# Patient Record
Sex: Female | Born: 1945 | Race: White | Hispanic: No | Marital: Married | Smoking: Never smoker
Health system: Southern US, Community
[De-identification: ages and names within clinical notes are randomized; demographics above are authoritative.]

## PROBLEM LIST (undated history)

## (undated) DIAGNOSIS — N189 Chronic kidney disease, unspecified: Secondary | ICD-10-CM

## (undated) DIAGNOSIS — I671 Cerebral aneurysm, nonruptured: Secondary | ICD-10-CM

## (undated) DIAGNOSIS — F0150 Vascular dementia without behavioral disturbance: Secondary | ICD-10-CM

## (undated) DIAGNOSIS — I679 Cerebrovascular disease, unspecified: Secondary | ICD-10-CM

## (undated) DIAGNOSIS — I509 Heart failure, unspecified: Secondary | ICD-10-CM

## (undated) DIAGNOSIS — I10 Essential (primary) hypertension: Secondary | ICD-10-CM

## (undated) DIAGNOSIS — F329 Major depressive disorder, single episode, unspecified: Secondary | ICD-10-CM

## (undated) DIAGNOSIS — F32A Depression, unspecified: Secondary | ICD-10-CM

## (undated) DIAGNOSIS — I2510 Atherosclerotic heart disease of native coronary artery without angina pectoris: Secondary | ICD-10-CM

## (undated) HISTORY — PX: ABDOMINAL SURGERY: SHX537

## (undated) HISTORY — PX: CSF SHUNT: SHX92

## (undated) HISTORY — PX: ROTATOR CUFF REPAIR: SHX139

## (undated) HISTORY — PX: CEREBRAL ANEURYSM REPAIR: SHX164

---

## 2006-08-03 ENCOUNTER — Inpatient Hospital Stay (HOSPITAL_COMMUNITY): Admission: EM | Admit: 2006-08-03 | Discharge: 2006-08-05 | Payer: Self-pay | Admitting: Emergency Medicine

## 2006-08-10 ENCOUNTER — Ambulatory Visit: Payer: Self-pay | Admitting: Internal Medicine

## 2006-09-02 ENCOUNTER — Ambulatory Visit: Payer: Self-pay | Admitting: Internal Medicine

## 2006-09-02 LAB — CONVERTED CEMR LAB
Eosinophils Absolute: 0.1 10*3/uL (ref 0.0–0.6)
HCT: 32.3 % — ABNORMAL LOW (ref 36.0–46.0)
Monocytes Absolute: 0.3 10*3/uL (ref 0.2–0.7)
Monocytes Relative: 4.9 % (ref 3.0–11.0)
Neutro Abs: 5.3 10*3/uL (ref 1.4–7.7)
Platelets: 337 10*3/uL (ref 150–400)
RBC: 3.76 M/uL — ABNORMAL LOW (ref 3.87–5.11)
RDW: 17 % — ABNORMAL HIGH (ref 11.5–14.6)
WBC: 7.1 10*3/uL (ref 4.5–10.5)

## 2006-10-15 ENCOUNTER — Ambulatory Visit: Payer: Self-pay | Admitting: Internal Medicine

## 2006-10-15 ENCOUNTER — Encounter: Payer: Self-pay | Admitting: Internal Medicine

## 2010-10-31 NOTE — H&P (Signed)
NAME:  AERILYNN, GOIN NO.:  [id number]   MEDICAL RECORD NO.:  [id number]          PATIENT TYPE:  EMS   LOCATION:  ED                           FACILITY:  [facility]   PHYSICIAN:  Hind Bosie Helper, MD      DATE OF BIRTH:  [birth year]   DATE OF ADMISSION:  08/02/2006  DATE OF DISCHARGE:                              HISTORY & PHYSICAL   PRIMARY CARE PHYSICIAN:  Unassigned.   CHIEF COMPLAINT:  Generalized weakness with presyncope for 1 day.   HISTORY OF PRESENT ILLNESS:  This is a 65 year old female with history  of hypertension, history of peptic ulcer disease.  She was recently  admitted to [facility] with history of  weakness, found to have hemoglobin of 6.7 status post endoscopy which  showed pyloric channel ulcer and anemia status post blood transfusion.  The patient was discharged with Protonix and advised to follow with  gastroenterology in 6 weeks for further evaluation.  The patient  admitted since discharge she was feeling generally weak and dizzy with  palpitations and  shortness of breath with minimal exertion.  Today  while she was at home, she was starting to eat a few sips of food, but  then she felt shivering and weak and dizzy.  Her husband 911 for further  evaluation.  The patient denies any nausea or any vomiting.  She had  epigastric pain mainly at the epigastric area 8/10.  She admitted was  like heartburn, not radiating and is not associated with any change in  bowel habits.  The patient had bowel movement today which she said is  brown in color and also condition associated with headaches but denies  any fever, no loss of consciousness and no lower or upper extremity  weakness.   PAST MEDICAL HISTORY:  1. History of peptic ulcer disease status post endoscopy which showed      pyloric channel ulcer, cannot rule out Helicobacter.  2. Anemia.  3. Esophageal nodule of uncertain clinical significance, rule out  neoplasm.  4. Gastroesophageal reflux disease.  5. Hernia.  6. Diverticulosis.  7. History of brain aneurysm status post VP shunt.  8. History of back surgery.  9. History of hypertension.  10.History of depression.  11.History of hyperlipidemia.   MEDICATIONS:  1. Benicar 20/12.5, 1 tab p.o. daily.  2. Metoprolol 25 mg p.o. daily.  3. Amoxicillin 500 mg p.o. q.6h. p.r.n. for tooth pain which was      recently prescribed by the primary care physician.  4. Alprazolam 1 mg.  5. Cymbalta 60 mg 1 p.o. daily.   ALLERGIES:  TEGRETOL, PHENYTOIN.   SOCIAL HISTORY:  The patient has no history of smoking, history of  alcohol, occasional IV drug use.  She is on disability and she lives  with her daughter and her husband.   FAMILY HISTORY:  Positive for mother died with history of emphysema.  Brother died with cancer of the __________.   PHYSICAL EXAMINATION:  VITAL SIGNS:  The patient seen in the emergency  room with vital signs temperature 98.5, blood pressure 102/42,  pulse  rate 85, respiratory rate 16, saturating 100%.  GENERAL:  The patient looks pale, not jaundiced or cyanotic, complains  of headache.  HEENT:  Normocephalic, atraumatic.  Pupils equal, and reactive to light  and accommodation.  Mucosa moist.  NECK:  No JVD, no thyromegaly, and no lymph nodes.  LUNGS:  Normal vesicular breathing, equal air entry.  HEART:  S1, S2, no added sound.  ABDOMEN:  Mild epigastric tenderness.  Bowel sounds are sluggish, and no  organomegaly.  No rebound tenderness and no guarding.  EXTREMITIES:  No lower limb edema and peripheral pulses intact.  SKIN:  No rash.  NEUROLOGIC:  The patient alert and oriented x3, was nonfocal neurologic  findings.   LABORATORY DATA:  Sodium 130, potassium 4.5, chloride 96, BUN 20,  creatinine 1.08, calcium 8.7, white blood cells 55.2, hemoglobin 7.1,  platelets 774, MCV 85.9, MCH 33.3, INR 1, PTT 28.  Occult blood was  negative.  CT head:  Right parietal VP  shunt in place.  No  hydrocephalus.  No acute intracranial abnormality, mild chronic mitral  vascular disease.   ASSESSMENT/PLAN:  1. The patient mainly admitted for generalized body weakness.  She      also said feeling of presyncope and dizziness, possibility due to      anemia.  The patient was discharged recently from [facility] with according to records, hemoglobin was 10.5.  Today      hemoglobin is 7.1, so most probably the cause of dizziness and      generalized body weakness due to the gastrointestinal bleeding from      the peptic ulcer disease.  We will admit the patient to telemetry.      We will give the patient 2 units of packed RBCs.  We will check CBC      q.6h.  We will continue with Protonix IV.  We will consider GI      consult in the morning.  2. Thrombocytopenia with platelets 74.  The patient was discharged      yesterday on January 28, the patient's platelets were almost      normal.  Cause is unclear.  Possibility of lab error.  Would repeat      platelet count.  We will repeat CBC and further recommendations      after __________ if platelet count was low with low hemoglobin and      low white blood cells which is unlikely.  My suspicion is just lab      error.  3. Pyloric channel ulcer.  To rule out stenosis, we will do abdominal      x-ray, flat and upright, to rule out any evidence of complication      of peptic ulcer perforation or bleeding.  I would consider doing      gastric emptying study or __________ .  We will consider barium      swallow with follow-through to evaluate for any evidence of pyloric      stenosis.  We will continue with Protonix and Sucralfate, and      further recommendation according to the gastroenterology.  4. Deep venous thrombosis and gastrointestinal prophylaxis.     Hind Bosie Helper, MD  Electronically Signed    HIE/MEDQ  D:  08/03/2006  T:  08/03/2006  Job:  [id number]

## 2010-10-31 NOTE — Consult Note (Signed)
[name], Campbell NO.:  [id number]   MEDICAL RECORD NO.:  [id number]          PATIENT TYPE:  INP   LOCATION:  1439                         FACILITY:  [facility]   PHYSICIAN:  Iva Boop, MD,FACGDATE OF BIRTH:  1946/03/17   DATE OF CONSULTATION:  DATE OF DISCHARGE:                                 CONSULTATION   This patient was seen yesterday by the physician's assistant.  This is a  consultation summary.  I have also seen the patient.   ASSESSMENT:  1. Pyloric channel ulcer found by Dr. Vernell Barrier in [facility].  She left      the hospital without proton pump inhibitor therapy and presented      here with heme positive stool and anemia with hemoglobin of 6.7.      She has now improved after transfusion and is on PPI therapy.  2. Colonoscopy at same admission in [facility] in January, 2008, which      was unrevealing.  3. Helicobacter pylori testing was negative.  4. Chronic headaches.  5. Nausea and vomiting while he improves, somewhat related to the      headaches.  6. Hypertension.  7. History of ventriculoperitoneal shunt.  8. Heartburn.  9. Three back surgeries.  10.Two cervical spine surgeries.  11.Dyslipidemia.   ALLERGIES:  DILANTIN, TEGRETOL.   FAMILY HISTORY:  No colon cancer.   PLAN:  Patient is supported here.  She was transfused this  hospitalization and then improved.  She appears to be tolerating diet,  and plans are to be discharged tomorrow.  She is moving to [place] to  live with her daughter, and she will most likely follow up with me as an  outpatient.  No further endoscopic procedures were necessary.      Iva Boop, MD,FACG  Electronically Signed     CEG/MEDQ  D:  08/04/2006  T:  08/04/2006  Job:  [id number]

## 2010-10-31 NOTE — Assessment & Plan Note (Signed)
Kristen Campbell                         GASTROENTEROLOGY OFFICE NOTE   NAME:Campbell, Kristen                     MRN:          [id number]  DATE:09/02/2006                            DOB:          1945-06-23    CHIEF COMPLAINT:  Followup of gastric ulcer and anemia.   HISTORY:  Kristen Campbell is a lady that I saw in the hospital.  She had  pyloric channel ulcer diagnosed by Kristen Campbell in [facility] back in  January.  Colonoscopy at that time was unrevealing. She had presented  with heme positive stool and anemia and hemoglobin 6.7.  She moved up  here to live with her daughter and somehow she got out of the hospital  without a PPI.  She was readmitted in [place] with anemia.  I saw  her, we got her on a proton pump inhibitor, she was transfused and  started on iron and she is feeling much better.  She also had an  esophageal nodule in her esophagus that was of unclear etiology on the  endoscopy. I did get a chance to review that when she was in the  hospital, I do not have those records in my chart now.  Please see the  hospitalist history and physical and discharge summary which were  reviewed in her NE chart as well.   MEDICATIONS:  1. Benicar 20/12.5 mg daily.  2. Metoprolol 50 mg daily.  3. Lipitor 40 mg daily.  4. Cymbalta 60 mg daily.  5. Alprazolam 1 mg daily.  6. Protonix 40 mg twice daily.   ALLERGIES:  DILANTIN and  TEGRETOL.   PAST MEDICAL HISTORY:  She has depression, hypertension,  asthma/emphysema, dyslipidemia, osteoarthritis, gastric ulcer problems  15 years previously and in January 2008 anemia as above.   FAMILY HISTORY:  Noncontributory.   SOCIAL HISTORY:  She is living with her daughter now.  She is married.  She is disabled.  No alcohol, tobacco or drugs.   REVIEW OF SYSTEMS:  See medical record for full details.   PHYSICAL EXAMINATION:  Height 4 feet 11 inches, weight 125 pounds.  Blood pressure 130/78, pulse 64.   ASSESSMENT:  Recent problems with a pyloric channel ulcer and profound  anemia.  She is improving on appropriate therapy at this time.  She had  negative H. pylori testing.  These ulcers are probably benign but there  was really no history of NSAIDs and there was no H. pylori.   PLAN:  1. Recheck CBC to follow up anemia.  2. Schedule endoscopy in the next few weeks to evaluate for healing of      this ulcer and to confirm that.  3. She can reduce her Protonix to once a day.  4. Further plans pending above.     Kristen Boop, MD,FACG  Electronically Signed    CEG/MedQ  DD: 09/03/2006  DT: 09/03/2006  Job #: [id number]

## 2010-10-31 NOTE — Discharge Summary (Signed)
Kristen Campbell, Kristen Campbell NO.:  [id number]   MEDICAL RECORD NO.:  [id number]          PATIENT TYPE:  INP   LOCATION:  1439                         FACILITY:  [facility]   PHYSICIAN:  Hettie Holstein, D.O.    DATE OF BIRTH:  01-16-1946   DATE OF ADMISSION:  08/03/2006  DATE OF DISCHARGE:  08/05/2006                               DISCHARGE SUMMARY   PRIMARY CARE PHYSICIAN:  Unassigned.   PRIMARY GASTROENTEROLOGIST:  Dr. Stan Head.   FINAL DIAGNOSIS:  Pyloric ulcer.   SECONDARY DIAGNOSES:  1. Gastrointestinal bleed secondary to the above, anemia due to acute      blood loss anemia secondary to the above, resolved, low platelets      secondary to the above acute bleed.  2. Urinary tract infection.  3. History of ventriculoperitoneal shunt.  4. Hypertension.   MEDICATIONS ON DISCHARGE:  1. Keflex 500 mg twice daily.  2. Prilosec over-the-counter 20 mg twice daily.  3. In addition, she is to continue metoprolol 25 mg daily as before as      well as Amoxil 500 mg p.o. t.i.d.  4. Benicar HCT she is also to continue 20/12.5 daily.  5. Cymbalta 60 mg daily.  6. Lipitor 40 mg daily.   All of these have been prescribed previously by her primary care  Rilyn Upshaw.   DISPOSITION:  Kristen Campbell is felt to be stable for discharge and was  discharged home to followup with Dr. Leone Payor.   LABORATORY DATA:  Sodium is 138, potassium 4.1, BUN 10, creatinine 0.8,  hemoglobin 11 status post 2 units of packed RBCs.  Urinalysis revealed  E. coli in 60,000 colonies.   CONSULTANTS THIS ADMISSION:  Dr. Leone Payor as noted above of  Gastroenterology.   PROCEDURE PERFORMED:  She had previously undergone endoscopic evaluation  by Dr. Vernell Barrier in [facility] revealing a pyloric channel ulcer and  apparently had presented to the hospital, not taking a proton pump  inhibitor.  It was also noted that she underwent colonoscopic  examination at [facility] as well that was as well unrevealing.   HISTORY OF PRESENT ILLNESS:  Please refer to the H&P dictated by Dr.  Eda Paschal.  However, briefly, Kristen Campbell is a 65 year old female with a  history of hypertension and history of pyloric channel ulcer evaluated  at [facility] with  a history of weakness.  She was found to  have a hemoglobin at 6.7, status post endoscopy.  She was discharged  with Protonix and advised to followup with Gastroenterology in 6 weeks  for further evaluation.  Apparently, she was not taking a proton pump  inhibitor the day of admission.  She was at home and began to eat a few  sips of food and then she became weak and dizzy.  Her husband called 9-1-  1.  She denied any nausea or vomiting.  She has some epigastric pain  mainly in the epigastric region.  She reported brown stools.  In any  event, she denied any loss of consciousness.  She was admitted for  further evaluation of the hospital course.  In the Emergency Department,  she was noted to have hemoglobin of 7.1.  She underwent CT scan of her  head and revealed a VP shunt placed.  In any event, she was admitted,  and Gastroenterology was consulted.  Her platelets were low at 74,000,  also.  She was typed and crossed and began transfusion.  Her hemoglobin  stabilized, and she improved symptomatically with fluids and proton pump  inhibitor.  She was seen by Gastroenterology.  She was treated with  antiemetics, and she was advanced with reference to her diet which she  tolerated well.  Her urinalysis revealed E. coli.  She continued on  treatment for urinary infection.  She had been on amoxicillin which she  was to continue.  Her hemoglobin stabilized.  She was felt to be  medically stable for discharge and followup.      Hettie Holstein, D.O.  Electronically Signed     Hettie Holstein, D.O.  Electronically Signed    ESS/MEDQ  D:  09/23/2006  T:  09/23/2006  Job:  [id number]

## 2013-02-11 DIAGNOSIS — G9340 Encephalopathy, unspecified: Secondary | ICD-10-CM | POA: Insufficient documentation

## 2013-02-11 DIAGNOSIS — R079 Chest pain, unspecified: Secondary | ICD-10-CM | POA: Insufficient documentation

## 2014-09-08 ENCOUNTER — Emergency Department (HOSPITAL_COMMUNITY): Payer: Medicare Other

## 2014-09-08 ENCOUNTER — Emergency Department (HOSPITAL_COMMUNITY)
Admission: EM | Admit: 2014-09-08 | Discharge: 2014-09-09 | Disposition: A | Discharge status: Home / Self Care | Payer: Medicare Other | Attending: Emergency Medicine | Admitting: Emergency Medicine

## 2014-09-08 ENCOUNTER — Encounter (HOSPITAL_COMMUNITY): Payer: Self-pay | Admitting: *Deleted

## 2014-09-08 DIAGNOSIS — Z79899 Other long term (current) drug therapy: Secondary | ICD-10-CM | POA: Insufficient documentation

## 2014-09-08 DIAGNOSIS — R5383 Other fatigue: Secondary | ICD-10-CM | POA: Diagnosis not present

## 2014-09-08 DIAGNOSIS — F4321 Adjustment disorder with depressed mood: Secondary | ICD-10-CM | POA: Diagnosis not present

## 2014-09-08 DIAGNOSIS — W1839XA Other fall on same level, initial encounter: Secondary | ICD-10-CM | POA: Diagnosis not present

## 2014-09-08 DIAGNOSIS — Y998 Other external cause status: Secondary | ICD-10-CM | POA: Diagnosis not present

## 2014-09-08 DIAGNOSIS — I10 Essential (primary) hypertension: Secondary | ICD-10-CM | POA: Insufficient documentation

## 2014-09-08 DIAGNOSIS — Y9389 Activity, other specified: Secondary | ICD-10-CM | POA: Diagnosis not present

## 2014-09-08 DIAGNOSIS — W19XXXA Unspecified fall, initial encounter: Secondary | ICD-10-CM

## 2014-09-08 DIAGNOSIS — S0990XA Unspecified injury of head, initial encounter: Secondary | ICD-10-CM | POA: Diagnosis present

## 2014-09-08 DIAGNOSIS — G44319 Acute post-traumatic headache, not intractable: Secondary | ICD-10-CM | POA: Diagnosis not present

## 2014-09-08 DIAGNOSIS — Y9289 Other specified places as the place of occurrence of the external cause: Secondary | ICD-10-CM | POA: Diagnosis not present

## 2014-09-08 HISTORY — DX: Depression, unspecified: F32.A

## 2014-09-08 HISTORY — DX: Essential (primary) hypertension: I10

## 2014-09-08 HISTORY — DX: Cerebral aneurysm, nonruptured: I67.1

## 2014-09-08 HISTORY — DX: Major depressive disorder, single episode, unspecified: F32.9

## 2014-09-08 LAB — COMPREHENSIVE METABOLIC PANEL
ALBUMIN: 4.5 g/dL (ref 3.5–5.2)
ALK PHOS: 357 U/L — AB (ref 39–117)
ALT: 102 U/L — ABNORMAL HIGH (ref 0–35)
AST: 114 U/L — AB (ref 0–37)
Anion gap: 12 (ref 5–15)
BUN: 12 mg/dL (ref 6–23)
CALCIUM: 9.9 mg/dL (ref 8.4–10.5)
CO2: 27 mmol/L (ref 19–32)
Chloride: 103 mmol/L (ref 96–112)
Creatinine, Ser: 0.89 mg/dL (ref 0.50–1.10)
GFR calc Af Amer: 77 mL/min — ABNORMAL LOW (ref >90)
GFR calc non Af Amer: 66 mL/min — ABNORMAL LOW (ref >90)
Glucose, Bld: 103 mg/dL — ABNORMAL HIGH (ref 70–99)
Potassium: 3.8 mmol/L (ref 3.5–5.1)
SODIUM: 142 mmol/L (ref 135–145)
Total Bilirubin: 0.4 mg/dL (ref 0.3–1.2)
Total Protein: 8.7 g/dL — ABNORMAL HIGH (ref 6.0–8.3)

## 2014-09-08 LAB — CBC
HCT: 41.9 % (ref 36.0–46.0)
Hemoglobin: 13.5 g/dL (ref 12.0–15.0)
MCH: 29.5 pg (ref 26.0–34.0)
MCHC: 32.2 g/dL (ref 30.0–36.0)
MCV: 91.7 fL (ref 78.0–100.0)
PLATELETS: 325 10*3/uL (ref 150–400)
RBC: 4.57 MIL/uL (ref 3.87–5.11)
RDW: 13.9 % (ref 11.5–15.5)
WBC: 7.8 10*3/uL (ref 4.0–10.5)

## 2014-09-08 LAB — URINALYSIS, ROUTINE W REFLEX MICROSCOPIC
Bilirubin Urine: NEGATIVE
Glucose, UA: NEGATIVE mg/dL
Ketones, ur: NEGATIVE mg/dL
NITRITE: NEGATIVE
Protein, ur: NEGATIVE mg/dL
Specific Gravity, Urine: 1.011 (ref 1.005–1.030)
UROBILINOGEN UA: 1 mg/dL (ref 0.0–1.0)
pH: 6 (ref 5.0–8.0)

## 2014-09-08 LAB — URINE MICROSCOPIC-ADD ON

## 2014-09-08 LAB — ETHANOL

## 2014-09-08 LAB — ACETAMINOPHEN LEVEL

## 2014-09-08 LAB — SALICYLATE LEVEL

## 2014-09-08 MED ORDER — NICOTINE 21 MG/24HR TD PT24: 21.0000 mg | MEDICATED_PATCH | Freq: Every day | TRANSDERMAL | Status: DC

## 2014-09-08 MED ORDER — LORAZEPAM 1 MG PO TABS
1.0000 mg | ORAL_TABLET | Freq: Three times a day (TID) | ORAL | Status: DC | PRN
  Administered 2014-09-08 – 2014-09-09 (×2): 1 mg via ORAL
  Filled 2014-09-08 (×2): qty 1

## 2014-09-08 MED ORDER — ALUM & MAG HYDROXIDE-SIMETH 200-200-20 MG/5ML PO SUSP: 30.0000 mL | ORAL | Status: DC | PRN

## 2014-09-08 MED ORDER — NEBIVOLOL HCL 10 MG PO TABS
10.0000 mg | ORAL_TABLET | Freq: Every day | ORAL | Status: DC
  Administered 2014-09-08 – 2014-09-09 (×2): 10 mg via ORAL
  Filled 2014-09-08 (×2): qty 1

## 2014-09-08 MED ORDER — ATORVASTATIN CALCIUM 10 MG PO TABS
10.0000 mg | ORAL_TABLET | Freq: Every day | ORAL | Status: DC
  Administered 2014-09-08 – 2014-09-09 (×2): 10 mg via ORAL
  Filled 2014-09-08 (×2): qty 1

## 2014-09-08 MED ORDER — ONDANSETRON HCL 4 MG PO TABS
4.0000 mg | ORAL_TABLET | Freq: Three times a day (TID) | ORAL | Status: DC | PRN
Start: 2014-09-08 — End: 2014-09-09

## 2014-09-08 MED ORDER — IBUPROFEN 200 MG PO TABS: 600.0000 mg | ORAL_TABLET | Freq: Three times a day (TID) | ORAL | Status: DC | PRN

## 2014-09-08 MED ORDER — OXYCODONE-ACETAMINOPHEN 5-325 MG PO TABS
1.0000 | ORAL_TABLET | Freq: Three times a day (TID) | ORAL | Status: DC | PRN
  Administered 2014-09-08 – 2014-09-09 (×2): 1 via ORAL
  Filled 2014-09-08 (×2): qty 1

## 2014-09-08 MED ORDER — CYCLOBENZAPRINE HCL 10 MG PO TABS
10.0000 mg | ORAL_TABLET | Freq: Three times a day (TID) | ORAL | Status: DC | PRN
Start: 2014-09-08 — End: 2014-09-09

## 2014-09-08 MED ORDER — ACETAMINOPHEN 325 MG PO TABS: 650.0000 mg | ORAL_TABLET | ORAL | Status: DC | PRN

## 2014-09-08 MED ORDER — DULOXETINE HCL 60 MG PO CPEP
60.0000 mg | ORAL_CAPSULE | Freq: Every day | ORAL | Status: DC
  Administered 2014-09-08 – 2014-09-09 (×2): 60 mg via ORAL
  Filled 2014-09-08 (×2): qty 1

## 2014-09-08 MED ORDER — ACETAMINOPHEN 500 MG PO TABS
1000.0000 mg | ORAL_TABLET | Freq: Once | ORAL | Status: AC
  Administered 2014-09-08: 1000 mg via ORAL
  Filled 2014-09-08: qty 2

## 2014-09-08 MED ORDER — ZOLPIDEM TARTRATE 5 MG PO TABS: 5.0000 mg | ORAL_TABLET | Freq: Every evening | ORAL | Status: DC | PRN

## 2014-09-08 NOTE — ED Notes (Signed)
Pt ambulated to restroom. 

## 2014-09-08 NOTE — ED Notes (Signed)
MD Walden at bedside 

## 2014-09-08 NOTE — ED Notes (Signed)
TTS at bedside. 

## 2014-09-08 NOTE — ED Notes (Signed)
ED PA at bedside

## 2014-09-08 NOTE — ED Notes (Signed)
Pt granddaughter reports that pt is suicidal and reports that she wants to die. Also adds that pt is unable to take care of herself. Pt verbalizes that she doesn't want to be here

## 2014-09-08 NOTE — ED Notes (Signed)
Pt acuity high d/t fall risk and SI reported by family. Pt calm and cooperative at this time.

## 2014-09-08 NOTE — BH Assessment (Signed)
Assessment Note  Kristen Campbell is an 69 y.o. female.  Patient was brought into the ED by family because of increased depression and alleged suicidal ideations.  Patient currently denies SI/HI, hallucinations, and other self-injurious behaviors.  Patient endorse symptoms of depression but denies suicidal thoughts. Patient admits to saying to her son-n-law she rather die than to allow him to take her to jail.  Per the patient the family was demanding that she and her husband stay at their home or the son-n-law was arrest her.  Per patient the son-n-law knows how terrified she is of jail so he was attempting to intimidate her so she said, "I rather die than allow you to take me to jail".  Patient reports she rolled out of the bed yesterday as a result hit her head and now the family is unsure if she can care for herself or her husband.  Patient reports being married for 47 years and always lived independent with her husband so the family accusations of her not having the capability to live alone with her husband is offensive. Patient began to mention the death of her family when she was 69 years old then became tearful, expressing the anger she has towards the person that caused the death and the grief about the loss.    CSW spoke with the daughter(Kristen Campbell) and granddaughter(Kristen Campbell) to collect collateral.  Family reports the patient was diagnosed with depression many years ago and ever since been medicated.  Daughter reports she is now over 40 years and at the age 48 her mother overdosed on medications per story from other family members.  Family reports for the past 2 years the patient increased sleeping, misusing medications, decrease daily duties, and missing appointments.  Per family the patient is being picky about which medications she is taking and sleeping more than 8 hours a day.  Family reports the husband is diagnosed with Alzheimer and the patient has HTN, seizure, chronic pain and has not been compliant  with medication attempt stability.  Family reports that the patient made multiple states of risk like wanting to go to heaven with her father, not wanting to live anymore, and she want to be with her daddy.  CSW consulted with Catha Nottingham, NP recommended to re-evaluate patient in the morning.   Axis I: Mood Disorder NOS Axis II: Deferred Axis III:  Past Medical History  Diagnosis Date  . Brain aneurysm   . Hypertension   . Depression    Axis IV: economic problems, housing problems, other psychosocial or environmental problems, problems related to social environment, problems with access to health care services and problems with primary support group Axis V: 41-50 serious symptoms  Past Medical History:  Past Medical History  Diagnosis Date  . Brain aneurysm   . Hypertension   . Depression     Past Surgical History  Procedure Laterality Date  . Abdominal surgery      fluid tumor removal  . Abdominal surgery      bleeding ulcers  . Rotator cuff repair    . Cerebral aneurysm repair    . Csf shunt      Family History: No family history on file.  Social History:  reports that she has never smoked. She does not have any smokeless tobacco history on file. She reports that she does not drink alcohol or use illicit drugs.  Additional Social History:     CIWA: CIWA-Ar BP: 163/81 mmHg Pulse Rate: 80 COWS:    Allergies:  Allergies  Allergen Reactions  . Codeine Nausea And Vomiting  . Dilantin [Phenytoin] Other (See Comments)    MD said never to take it unknown     Home Medications:  (Not in a hospital admission)  OB/GYN Status:  No LMP recorded. Patient is postmenopausal.  General Assessment Data Location of Assessment: [facility] ED ACT Assessment: Yes Is this a Tele or Face-to-Face Assessment?: Face-to-Face Is this an Initial Assessment or a Re-assessment for this encounter?: Initial Assessment Living Arrangements: Spouse/significant other Can pt return to current living  arrangement?: Yes Admission Status: Voluntary Is patient capable of signing voluntary admission?: Yes Transfer from: Home Referral Source: Self/Family/Friend  Medical Screening Exam [facility] Walk-in ONLY) Medical Exam completed: Yes  [facility] Crisis Care Plan Living Arrangements: Spouse/significant other Name of Psychiatrist: none Name of Therapist: none  Education Status Is patient currently in school?: No  Risk to self with the past 6 months Suicidal Ideation: No-Not Currently/Within Last 6 Months (Family reports pt having SI) Suicidal Intent: No-Not Currently/Within Last 6 Months Is patient at risk for suicide?: No (Patient denies) Suicidal Plan?: No-Not Currently/Within Last 6 Months Access to Means: No What has been your use of drugs/alcohol within the last 12 months?: none Previous Attempts/Gestures: Yes How many times?: 1 (Per family Pt overdosed more than 20 years ago) Triggers for Past Attempts: Family contact, Anniversary (grief) Intentional Self Injurious Behavior: None Family Suicide History: No Recent stressful life event(s): Conflict (Comment), Loss (Comment), Other (Comment) Persecutory voices/beliefs?: No Depression: Yes Depression Symptoms: Tearfulness, Guilt, Loss of interest in usual pleasures, Fatigue Substance abuse history and/or treatment for substance abuse?: No  Risk to Others within the past 6 months Homicidal Ideation: No-Not Currently/Within Last 6 Months Thoughts of Harm to Others: No-Not Currently Present/Within Last 6 Months Current Homicidal Intent: No-Not Currently/Within Last 6 Months Current Homicidal Plan: No-Not Currently/Within Last 6 Months Access to Homicidal Means: No History of harm to others?: No Assessment of Violence: None Noted Does patient have access to weapons?: No Criminal Charges Pending?: No Does patient have a court date: No  Psychosis Hallucinations: None noted Delusions: None noted  Mental Status  Report Appearance/Hygiene: In hospital gown Eye Contact: Fair Motor Activity: Freedom of movement Speech: Logical/coherent Level of Consciousness: Alert Mood: Depressed Affect: Flat Anxiety Level: Minimal Thought Processes: Coherent Judgement: Unimpaired Orientation: Person, Place, Time, Situation Obsessive Compulsive Thoughts/Behaviors: None  Cognitive Functioning Concentration: Fair Memory: Recent Intact, Remote Intact IQ: Average Insight: Fair Impulse Control: Fair Appetite: Fair Sleep: Increased Vegetative Symptoms: None  ADLScreening [facility] Assessment Services) Patient's cognitive ability adequate to safely complete daily activities?: Yes Patient able to express need for assistance with ADLs?: Yes Independently performs ADLs?: Yes (appropriate for developmental age)  Prior Inpatient Therapy Prior Inpatient Therapy: No  Prior Outpatient Therapy Prior Outpatient Therapy: Yes Prior Therapy Dates: unknown Prior Therapy Facilty/Provider(s): unknown Reason for Treatment: depression  ADL Screening (condition at time of admission) Patient's cognitive ability adequate to safely complete daily activities?: Yes Patient able to express need for assistance with ADLs?: Yes Independently performs ADLs?: Yes (appropriate for developmental age)             Advance Directives (For Healthcare) Does patient have an advance directive?: Yes Type of Advance Directive: Healthcare Power of Attorney, Living will    Additional Information 1:1 In Past 12 Months?: No CIRT Risk: No Elopement Risk: No Does patient have medical clearance?: Yes     Disposition:  Disposition Initial Assessment Completed for this Encounter: Yes Disposition of  Patient: Other dispositions Other disposition(s): Other (Comment) (pending)  On Site Evaluation by:   Reviewed with Physician:    Maryelizabeth Rowanorbett, Tamyra Fojtik A 09/08/2014 5:29 PM

## 2014-09-08 NOTE — ED Notes (Signed)
Pt ambulated to restroom with steady gait.

## 2014-09-08 NOTE — ED Notes (Signed)
Pt reports she fell and hit the back of her head 2 days ago. Has Hx of HA, which worsened slightly after fall. Rates it "11/10." Pt denies blurred vision, dizziness, n/v. Hx brain aneursym approx 20 years ago. Also reports being depressed for "years and years and years." Acutely worse due to "personal issues", life stressors which she does not elaborate on. Sts she doesn't want to be here, that granddaughter brought her and apologizing for looking disheveled as she was not planning to come here. Pt tearful through parts of triage.

## 2014-09-08 NOTE — ED Notes (Signed)
Security called to wand patient.   [facility]

## 2014-09-08 NOTE — ED Notes (Signed)
Md Gwendolyn GrantWalden to bedside.

## 2014-09-08 NOTE — ED Notes (Signed)
Pt reports a headache "all over"  And states I have headaches. She also states " my grandaughters think I'm crazy"

## 2014-09-08 NOTE — ED Provider Notes (Signed)
CSN: [id number]     Arrival date & time 09/08/14  1333 History   First MD Initiated Contact with Patient 09/08/14 1339     Chief Complaint  Patient presents with  . Medical Clearance  . Headache  . Hypertension     (Consider location/radiation/quality/duration/timing/severity/associated sxs/prior Treatment) HPI Comments: 69 year old female here with headache and psych complaints. Headache: Larey Seat out of her bed 2 days ago. He did not lose consciousness. She does not have any blurry vision, nausea, vomiting. She has a history of VP shunt. She has chronic headaches, this is unlike prior headache.  Psych: History of depression stemming from the death of her father 50 years ago. She states she's been depressed for a long time. Daughter and granddaughter in the room state patient has been reporting that she doesn't care anymore and that she wants to die. When asked the patient about these comments, she states that she did say these comments but she wanted to make her family angry because she doesn't feel like anyone cares about her. She states she's not currently suicidal and does not have a plan to kill herself.  Patient is a 69 y.o. female presenting with headaches and hypertension. The history is provided by the patient.  Headache Pain location:  Occipital Quality:  Dull Radiates to:  Does not radiate Severity currently:  10/10 Onset quality:  Gradual Duration:  2 days Timing:  Constant Progression:  Unchanged Chronicity:  New Similar to prior headaches: no   Context comment:  Larey Seat out of bed Relieved by:  Nothing Worsened by:  Nothing Associated symptoms: no cough and no fever   Hypertension Associated symptoms include headaches. Pertinent negatives include no shortness of breath.    Past Medical History  Diagnosis Date  . Brain aneurysm   . Hypertension   . Depression    Past Surgical History  Procedure Laterality Date  . Abdominal surgery      fluid tumor removal  .  Abdominal surgery      bleeding ulcers  . Rotator cuff repair    . Cerebral aneurysm repair    . Csf shunt     No family history on file. History  Substance Use Topics  . Smoking status: Never Smoker   . Smokeless tobacco: Not on file  . Alcohol Use: No   OB History    No data available     Review of Systems  Constitutional: Negative for fever and chills.  Respiratory: Negative for cough and shortness of breath.   Neurological: Positive for headaches.  All other systems reviewed and are negative.     Allergies  Codeine and Dilantin  Home Medications   Prior to Admission medications   Medication Sig Start Date End Date Taking? Authorizing Provider  atorvastatin (LIPITOR) 10 MG tablet Take 10 mg by mouth daily. 09/06/14  Yes Historical Provider, MD  BYSTOLIC 10 MG tablet Take 10 mg by mouth daily. 08/15/14  Yes Historical Provider, MD  cyclobenzaprine (FLEXERIL) 5 MG tablet Take 10 mg by mouth 3 (three) times daily as needed. 08/15/14  Yes Historical Provider, MD  DULoxetine (CYMBALTA) 60 MG capsule Take 60 mg by mouth daily. 08/09/14  Yes Historical Provider, MD  oxyCODONE-acetaminophen (PERCOCET/ROXICET) 5-325 MG per tablet Take 1 tablet by mouth every 8 (eight) hours as needed. pain 08/21/14  Yes Historical Provider, MD  Vitamin D, Ergocalciferol, (DRISDOL) 50000 UNITS CAPS capsule Take 50,000 Units by mouth once a week. 09/03/14   Historical Provider, MD  BP 159/95 mmHg  Pulse 74  Temp(Src) 98.8 F (37.1 C) (Oral)  Resp 18  SpO2 95% Physical Exam  Constitutional: She is oriented to person, place, and time. She appears well-developed and well-nourished. No distress.  HENT:  Head: Normocephalic and atraumatic.  Mouth/Throat: Oropharynx is clear and moist.  Eyes: EOM are normal. Pupils are equal, round, and reactive to light.  Neck: Normal range of motion. Neck supple.  Cardiovascular: Normal rate and regular rhythm.  Exam reveals no friction rub.   No murmur  heard. Pulmonary/Chest: Effort normal and breath sounds normal. No respiratory distress. She has no wheezes. She has no rales.  Abdominal: Soft. She exhibits no distension. There is no tenderness. There is no rebound.  Musculoskeletal: Normal range of motion. She exhibits no edema.  Neurological: She is alert and oriented to person, place, and time. No cranial nerve deficit. She exhibits normal muscle tone. Coordination normal.  Skin: No rash noted. She is not diaphoretic.  Psychiatric: She exhibits a depressed mood (very tearful).  Nursing note and vitals reviewed.   ED Course  Procedures (including critical care time) Labs Review Labs Reviewed  CBC  COMPREHENSIVE METABOLIC PANEL  ETHANOL  ACETAMINOPHEN LEVEL  SALICYLATE LEVEL  URINALYSIS, ROUTINE W REFLEX MICROSCOPIC    Imaging Review Dg Skull 1-3 Views  09/08/2014   CLINICAL DATA:  Fall 2 days ago, headache  EXAM: SKULL - 1-3 VIEW  COMPARISON:  No recent similar comparison exam  FINDINGS: There is no evidence of skull fracture or other focal bone lesions. Cervical fusion hardware partly visualized. Ventriculoperitoneal shunt catheter projecting over the right neck and upper chest is contiguous in its visualized aspects.  IMPRESSION: Negative.   Electronically Signed   By: Christiana Pellant M.D.   On: 09/08/2014 15:58   Dg Chest 2 View  09/08/2014   CLINICAL DATA:  Patient fell 2 days prior with headache. Hypertension  EXAM: CHEST  2 VIEW  COMPARISON:  August 03, 2006  FINDINGS: There are 2 shunt catheters tracking along the right hemithorax. Lungs are clear. Heart size and pulmonary vascularity are normal. No adenopathy. There is degenerative change in the thoracic spine. There is postoperative change in the lower cervical spine.  IMPRESSION: No edema or consolidation. Shunt catheters tracking along the right hemithorax anteriorly.   Electronically Signed   By: Bretta Bang III M.D.   On: 09/08/2014 15:55   Dg Abd 1  View  09/08/2014   CLINICAL DATA:  Headache with shunt catheters.  Recent trauma  EXAM: ABDOMEN - 1 VIEW  COMPARISON:  None.  FINDINGS: Two shunt catheters are noted with the tips in the right mid abdomen laterally. There is moderate stool in the colon. There is no bowel dilatation or air-fluid level suggesting obstruction. No free air. There are surgical clips near the gastroesophageal junction.  IMPRESSION: Bowel gas pattern unremarkable. Shunt catheters terminate in the right mid abdomen laterally. One of the catheters shows considerable coiling distally.   Electronically Signed   By: Bretta Bang III M.D.   On: 09/08/2014 15:56   Ct Head Wo Contrast  09/08/2014   CLINICAL DATA:  69 year old female with altered mental status and suicidal ideation.  EXAM: CT HEAD WITHOUT CONTRAST  TECHNIQUE: Contiguous axial images were obtained from the base of the skull through the vertex without intravenous contrast.  COMPARISON:  Head CT 08/02/2006.  FINDINGS: Right parietal ventriculostomy shunt catheter with tip terminating in the anterior aspect of the right lateral ventricle. Patchy and  confluent areas of decreased attenuation are noted throughout the deep and periventricular white matter of the cerebral hemispheres bilaterally, increased compared to the prior examination. The temporal horns of the lateral ventricles appear slightly rounded, which is only slightly more pronounced than the prior study 08/02/2006. No evidence of acute intracranial hemorrhage, no definite findings of acute/subacute cerebral ischemia, no mass or mass effect. Visualized paranasal sinuses and mastoids are well pneumatized. No acute displaced skull fractures are identified. Two small connector tubes extending to the ventriculostomy shunt catheter in the right posterior cervical region (1 of which is new compared to the prior study).  IMPRESSION: 1. Right parietal ventriculostomy shunt catheter in position, similar to the prior  examination. Today's study demonstrates very slight increased prominence of the ventricles, and more extensive periventricular low attenuation in the cerebral white matter than seen on the prior study. Given the intervening 8 year time interval, these findings are favored to reflect progressive atrophy (with ex vacuo dilatation of the ventricular system) and chronic ischemic changes in the cerebral white matter (rather than mild hydrocephalus and transependymal flow of CSF). Clinical correlation is recommended.   Electronically Signed   By: Trudie Reedaniel  Entrikin M.D.   On: 09/08/2014 18:27     EKG Interpretation None      MDM   Final diagnoses:  Fall  Depression  Acute post-traumatic headache, not intractable    68 rolled female here with headache and suicidal ideation. She denies any current suicidality and states she only commented on being suicidal because she wanted to make her family angry. She is currently on Cymbalta but it is not helping. She feels like most of her family doesn't care about her and states having neck her. Family denies this and states are very supportive. Pain patient is severely depressed. She is very tearful on exam. Since she was making suicidal comments, we will speak with psychiatry. She did fall is having very bad headache. She is not on blood thinners, but she does have a VP shunt. Will image her head. Head CT with chronic changes. Traumatic cause of headache, not acting like hydrocephalus, normal mental status, normal neurologic exam.  I feel she is medically clear. Psych wants to re-eval her in the morning, she is voluntarily staying at this time.  Elwin MochaBlair Khaza Blansett, MD 09/08/14 2233

## 2014-09-08 NOTE — ED Notes (Signed)
Pt tearful and anxious on arrival due to another pt yelling on unit. Kristen Campbell did, become much calmer after speaking with staff. No s/s of distress noted at this time. Respirations regular and unlabored, skin warm and dry.

## 2014-09-08 NOTE — ED Notes (Signed)
Pt changing into scrubs 

## 2014-09-08 NOTE — ED Notes (Signed)
Pt with high acuity due to anxiety and family reports pt with SI

## 2014-09-09 DIAGNOSIS — R5383 Other fatigue: Secondary | ICD-10-CM | POA: Diagnosis present

## 2014-09-09 DIAGNOSIS — F4321 Adjustment disorder with depressed mood: Secondary | ICD-10-CM | POA: Diagnosis not present

## 2014-09-09 MED ORDER — HYDROXYZINE HCL 25 MG PO TABS: 25.0000 mg | ORAL_TABLET | Freq: Every day | ORAL | Status: DC

## 2014-09-09 MED ORDER — NAPROXEN 375 MG PO TABS
375.0000 mg | ORAL_TABLET | Freq: Two times a day (BID) | ORAL | Status: DC
Start: 2014-09-09 — End: 2019-03-11

## 2014-09-09 MED ORDER — PANTOPRAZOLE SODIUM 20 MG PO TBEC
20.0000 mg | DELAYED_RELEASE_TABLET | Freq: Two times a day (BID) | ORAL | Status: DC
  Filled 2014-09-09 (×2): qty 1

## 2014-09-09 MED ORDER — PANTOPRAZOLE SODIUM 20 MG PO TBEC
20.0000 mg | DELAYED_RELEASE_TABLET | Freq: Two times a day (BID) | ORAL | Status: DC
Start: 2014-09-09 — End: 2019-03-14

## 2014-09-09 MED ORDER — NAPROXEN 375 MG PO TABS
375.0000 mg | ORAL_TABLET | Freq: Two times a day (BID) | ORAL | Status: DC
  Filled 2014-09-09 (×2): qty 1

## 2014-09-09 NOTE — Discharge Instructions (Signed)
Depression Depression is feeling sad, low, down in the dumps, blue, gloomy, or empty. In general, there are two kinds of depression:  Normal sadness or grief. This can happen after something upsetting. It often goes away on its own within 2 weeks. After losing a loved one (bereavement), normal sadness and grief may last longer than two weeks. It usually gets better with time.  Clinical depression. This kind lasts longer than normal sadness or grief. It keeps you from doing the things you normally do in life. It is often hard to function at home, work, or at school. It may affect your relationships with others. Treatment is often needed. GET HELP RIGHT AWAY IF:  You have thoughts about hurting yourself or others.  You lose touch with reality (psychotic symptoms). You may:  See or hear things that are not real.  Have untrue beliefs about your life or people around you.  Your medicine is giving you problems. MAKE SURE YOU:  Understand these instructions.  Will watch your condition.  Will get help right away if you are not doing well or get worse. Document Released: 07/04/2010 Document Revised: 10/16/2013 Document Reviewed: 10/01/2011 [employer] Patient Information 2015 [employer]. This information is not intended to replace advice given to you by your health care provider. Make sure you discuss any questions you have with your health care provider.  

## 2014-09-09 NOTE — [facility] Suicide Risk Assessment (Signed)
Suicide Risk Assessment  Discharge Assessment   [facility]   Demographic Factors:  Age 69 or older and Caucasian  Total Time spent with patient: 45 minutes  Musculoskeletal: Strength & Muscle Tone: within normal limits Gait & Station: normal Patient leans: N/A  Psychiatric Specialty Exam:     Blood pressure 142/102, pulse 76, temperature 98.2 F (36.8 C), temperature source Oral, resp. rate 17, SpO2 99 %.There is no height or weight on file to calculate BMI.  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 (Cigarettes, Smokeless Tobacco, Cigars, and/or Pipes) Yes, A prescription for an FDA-approved tobacco cessation medication was offered at discharge and the patient refused  Mental Status Per Nursing Assessment::   On Admission:   Depression  Current Mental Status by Physician: NA  Loss Factors: NA  Historical Factors: NA  Risk Reduction Factors:   Sense of responsibility to family, Living with another person, especially a relative, Positive social support and Positive coping skills or problem solving skills  Continued Clinical Symptoms:  Fatigue, mild depression  Cognitive Features That Contribute To Risk:  None    Suicide  Risk:  Minimal: No identifiable suicidal ideation.  Patients presenting with no risk factors but with morbid ruminations; may be classified as minimal risk based on the severity of the depressive symptoms  Principal Problem: Adjustment disorder with depressed mood Discharge Diagnoses:  Patient Active Problem List   Diagnosis Date Noted  . Caregiver with fatigue [R53.83] 09/09/2014    Priority: High  . Adjustment disorder with depressed mood [F43.21] 09/09/2014    Priority: High      Plan Of Care/Follow-up recommendations:  Activity:  as tolerated Diet:  heart healthy diet  Is patient on multiple antipsychotic therapies at discharge:  No   Has Patient had three or more failed trials of antipsychotic monotherapy by history:  No  Recommended Plan for Multiple Antipsychotic Therapies: NA    LORD, JAMISON, PMH-NP 09/09/2014, 1:31 PM

## 2014-09-09 NOTE — Consult Note (Signed)
[facility] Consult   Reason for Consult:  Depression Referring Physician:  EDP Patient Identification: Timera Windt MRN:  [id number] Principal Diagnosis: Adjustment disorder with depressed mood Diagnosis:   Patient Active Problem List   Diagnosis Date Noted  . Caregiver with fatigue [R53.83] 09/09/2014    Priority: High  . Adjustment disorder with depressed mood [F43.21] 09/09/2014    Priority: High    Total Time spent with patient: 45 minutes  Subjective:   Lygia Tworek is a 69 y.o. female patient does not warrant admission.  HPI:  The patient was brought to the ED by family due to sleeping and missing MD appointments.  Evidently, the family has wanted Omah and her husband to go to an assisted living for a year but she has been resistive.  On assessment this am, she promptly sat up and was trying to fix her hair, concerned about how she looked.  She denies suicidal/homicidal ideations, hallucinations, and alcohol/drug issues.  Shavell does express depression, "been depressed lot of years", started at the age of 25 when her father was killed in an car accident.  She went to counseling after his death until she married her husband (22 years) and had children.  Remy "lives for her grandchildren".  She has six grandchildren and two great-grandchildren.  Her grand-daughter was visiting prior to the assessment, very attentive to her grandmother.  Kailoni reports being exhausted at times trying to care for her husband who has been in and out of the hospital.  She believes she needs to be there when he is there to take care of him and it has worn her out.  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  She also takes Tylenol PM for sleep.  Her liver enzymes were elevated and she was educated about the importance of avoiding acetaminophen products.  She is started with a new PCP at the beginning of  April, notes given to her to take to him to change her pain medications to avoid acetaminophen.  Vistaril Rx given for PRN sleep versus her Tylenol PM.  Zayli was coherent, answered questions appropriately, alert and oriented x 4, good historian.  No past psychiatric history other than counseling (Cymbalta for depression and pain issues by her PCP), no past suicide attempts or ideations.  Stable for discharge with resources for counseling to assist her with her stress. HPI Elements:   Location:  generalized. Quality:  acute. Severity:  mild. Timing:  intermittent. Duration:  few days. Context:  stressors.  Past Medical History:  Past Medical History  Diagnosis Date  . Brain aneurysm   . Hypertension   . Depression     Past Surgical History  Procedure Laterality Date  . Abdominal surgery      fluid tumor removal  . Abdominal surgery      bleeding ulcers  . Rotator cuff repair    . Cerebral aneurysm repair    . Csf shunt     Family History: No family history on file. Social History:  History  Alcohol Use No     History  Drug Use No    History   Social History  . Marital Status: Married    Spouse Name: N/A  . Number of Children: N/A  . Years of Education: N/A   Social History Main Topics  . Smoking status: Never Smoker   . Smokeless tobacco: Not on file  .  Alcohol Use: No  . Drug Use: No  . Sexual Activity: Not on file   Other Topics Concern  . None   Social History Narrative  . None   Additional Social History:                          Allergies:   Allergies  Allergen Reactions  . Codeine Nausea And Vomiting  . Dilantin [Phenytoin] Other (See Comments)    MD said never to take it unknown     Labs:  Results for orders placed or performed during the hospital encounter of 09/08/14 (from the past 48 hour(s))  Urinalysis, Routine w reflex microscopic     Status: Abnormal   Collection Time: 09/08/14  3:15 PM  Result Value Ref Range   Color,  Urine YELLOW YELLOW   APPearance CLOUDY (A) CLEAR   Specific Gravity, Urine 1.011 1.005 - 1.030   pH 6.0 5.0 - 8.0   Glucose, UA NEGATIVE NEGATIVE mg/dL   Hgb urine dipstick TRACE (A) NEGATIVE   Bilirubin Urine NEGATIVE NEGATIVE   Ketones, ur NEGATIVE NEGATIVE mg/dL   Protein, ur NEGATIVE NEGATIVE mg/dL   Urobilinogen, UA 1.0 0.0 - 1.0 mg/dL   Nitrite NEGATIVE NEGATIVE   Leukocytes, UA TRACE (A) NEGATIVE  Urine microscopic-add on     Status: None   Collection Time: 09/08/14  3:15 PM  Result Value Ref Range   Squamous Epithelial / LPF RARE RARE   WBC, UA 0-2 <3 WBC/hpf   RBC / HPF 0-2 <3 RBC/hpf  CBC     Status: None   Collection Time: 09/08/14  3:23 PM  Result Value Ref Range   WBC 7.8 4.0 - 10.5 K/uL   RBC 4.57 3.87 - 5.11 MIL/uL   Hemoglobin 13.5 12.0 - 15.0 g/dL   HCT 41.9 36.0 - 46.0 %   MCV 91.7 78.0 - 100.0 fL   MCH 29.5 26.0 - 34.0 pg   MCHC 32.2 30.0 - 36.0 g/dL   RDW 13.9 11.5 - 15.5 %   Platelets 325 150 - 400 K/uL  Comprehensive metabolic panel     Status: Abnormal   Collection Time: 09/08/14  3:23 PM  Result Value Ref Range   Sodium 142 135 - 145 mmol/L   Potassium 3.8 3.5 - 5.1 mmol/L   Chloride 103 96 - 112 mmol/L   CO2 27 19 - 32 mmol/L   Glucose, Bld 103 (H) 70 - 99 mg/dL   BUN 12 6 - 23 mg/dL   Creatinine, Ser 0.89 0.50 - 1.10 mg/dL   Calcium 9.9 8.4 - 10.5 mg/dL   Total Protein 8.7 (H) 6.0 - 8.3 g/dL   Albumin 4.5 3.5 - 5.2 g/dL   AST 114 (H) 0 - 37 U/L   ALT 102 (H) 0 - 35 U/L   Alkaline Phosphatase 357 (H) 39 - 117 U/L   Total Bilirubin 0.4 0.3 - 1.2 mg/dL   GFR calc non Af Amer 66 (L) >90 mL/min   GFR calc Af Amer 77 (L) >90 mL/min    Comment: (NOTE) The eGFR has been calculated using the CKD EPI equation. This calculation has not been validated in all clinical situations. eGFR's persistently <90 mL/min signify possible Chronic Kidney Disease.    Anion gap 12 5 - 15  Ethanol     Status: None   Collection Time: 09/08/14  3:23 PM  Result  Value Ref Range   Alcohol, Ethyl (B) <5  0 - 9 mg/dL    Comment:        LOWEST DETECTABLE LIMIT FOR SERUM ALCOHOL IS 11 mg/dL FOR MEDICAL PURPOSES ONLY   Acetaminophen level     Status: Abnormal   Collection Time: 09/08/14  3:23 PM  Result Value Ref Range   Acetaminophen (Tylenol), Serum <10.0 (L) 10 - 30 ug/mL    Comment:        THERAPEUTIC CONCENTRATIONS VARY SIGNIFICANTLY. A RANGE OF 10-30 ug/mL MAY BE AN EFFECTIVE CONCENTRATION FOR MANY PATIENTS. HOWEVER, SOME ARE BEST TREATED AT CONCENTRATIONS OUTSIDE THIS RANGE. ACETAMINOPHEN CONCENTRATIONS >150 ug/mL AT 4 HOURS AFTER INGESTION AND >50 ug/mL AT 12 HOURS AFTER INGESTION ARE OFTEN ASSOCIATED WITH TOXIC REACTIONS.   Salicylate level     Status: None   Collection Time: 09/08/14  3:23 PM  Result Value Ref Range   Salicylate Lvl <1.9 2.8 - 20.0 mg/dL    Vitals: Blood pressure 142/102, pulse 76, temperature 98.2 F (36.8 C), temperature source Oral, resp. rate 17, SpO2 99 %.  Risk to Self: Suicidal Ideation: No-Not Currently/Within Last 6 Months (Family reports pt having SI) Suicidal Intent: No-Not Currently/Within Last 6 Months Is patient at risk for suicide?: No (Patient denies) Suicidal Plan?: No-Not Currently/Within Last 6 Months Access to Means: No What has been your use of drugs/alcohol within the last 12 months?: none How many times?: 1 (Per family Pt overdosed more than 20 years ago) Triggers for Past Attempts: Family contact, Anniversary (grief) Intentional Self Injurious Behavior: None Risk to Others: Homicidal Ideation: No-Not Currently/Within Last 6 Months Thoughts of Harm to Others: No-Not Currently Present/Within Last 6 Months Current Homicidal Intent: No-Not Currently/Within Last 6 Months Current Homicidal Plan: No-Not Currently/Within Last 6 Months Access to Homicidal Means: No History of harm to others?: No Assessment of Violence: None Noted Does patient have access to weapons?: No Criminal Charges  Pending?: No Does patient have a court date: No Prior Inpatient Therapy: Prior Inpatient Therapy: No Prior Outpatient Therapy: Prior Outpatient Therapy: Yes Prior Therapy Dates: unknown Prior Therapy Facilty/Provider(s): unknown Reason for Treatment: depression  Current Facility-Administered Medications  Medication Dose Route Frequency Provider Last Rate Last Dose  . alum & mag hydroxide-simeth (MAALOX/MYLANTA) 200-200-20 MG/5ML suspension 30 mL  30 mL Oral PRN Evelina Bucy, MD      . atorvastatin (LIPITOR) tablet 10 mg  10 mg Oral Daily Evelina Bucy, MD   10 mg at 09/09/14 0902  . cyclobenzaprine (FLEXERIL) tablet 10 mg  10 mg Oral TID PRN Evelina Bucy, MD      . DULoxetine (CYMBALTA) DR capsule 60 mg  60 mg Oral Daily Evelina Bucy, MD   60 mg at 09/09/14 0904  . hydrOXYzine (ATARAX/VISTARIL) tablet 25 mg  25 mg Oral QHS Seraphim Affinito      . naproxen (NAPROSYN) tablet 375 mg  375 mg Oral BID WC Melaney Tellefsen      . nebivolol (BYSTOLIC) tablet 10 mg  10 mg Oral Daily Evelina Bucy, MD   10 mg at 09/09/14 0903  . nicotine (NICODERM CQ - dosed in mg/24 hours) patch 21 mg  21 mg Transdermal Daily Evelina Bucy, MD   21 mg at 09/08/14 2038  . ondansetron (ZOFRAN) tablet 4 mg  4 mg Oral Q8H PRN Evelina Bucy, MD       Current Outpatient Prescriptions  Medication Sig Dispense Refill  . atorvastatin (LIPITOR) 10 MG tablet Take 10 mg by mouth daily.  0  . BYSTOLIC 10 MG tablet Take  10 mg by mouth daily.  0  . cyclobenzaprine (FLEXERIL) 5 MG tablet Take 10 mg by mouth 3 (three) times daily as needed.  0  . DULoxetine (CYMBALTA) 60 MG capsule Take 60 mg by mouth daily.  0  . oxyCODONE-acetaminophen (PERCOCET/ROXICET) 5-325 MG per tablet Take 1 tablet by mouth every 8 (eight) hours as needed. pain  0  . Vitamin D, Ergocalciferol, (DRISDOL) 50000 UNITS CAPS capsule Take 50,000 Units by mouth once a week.  0    Musculoskeletal: Strength & Muscle Tone: within normal limits Gait & Station:  normal Patient leans: N/A  Psychiatric Specialty Exam:     Blood pressure 142/102, pulse 76, temperature 98.2 F (36.8 C), temperature source Oral, resp. rate 17, SpO2 99 %.There is no height or weight on file to calculate BMI.  General Appearance: Casual  Eye Contact::  Good  Speech:  Normal Rate  Volume:  Normal  Mood:  Depressed, mild  Affect:  Congruent  Thought Process:  Coherent  Orientation:  Full (Time, Place, and Person)  Thought Content:  WDL  Suicidal Thoughts:  No  Homicidal Thoughts:  No  Memory:  Immediate;   Good Recent;   Good Remote;   Good  Judgement:  Good  Insight:  Good  Psychomotor Activity:  Normal  Concentration:  Good  Recall:  Good  Fund of Knowledge:Good  Language: Good  Akathisia:  No  Handed:  Right  AIMS (if indicated):     Assets:  Catering manager Housing Leisure Time Physical Health Resilience Social Support  ADL's:  Intact  Cognition: WNL  Sleep:      Medical Decision Making: Review of Psycho-Social Stressors (1), Review or order clinical lab tests (1) and Review of Medication Regimen & Side Effects (2)  Treatment Plan Summary: Daily contact with patient to assess and evaluate symptoms and progress in treatment, Medication management and Plan Discharge with out-patient resources  Plan:  No evidence of imminent risk to self or others at present.   Disposition: Discharge  Waylan Boga, [place] 09/09/2014 12:51 PM Patient seen face-to-face for psychiatric evaluation, chart reviewed and case discussed with the physician extender and developed treatment plan. Reviewed the information documented and agree with the treatment plan. Corena Pilgrim, MD

## 2014-09-09 NOTE — ED Notes (Addendum)
Acuity high. Pt remains fall risk and SI risk. Pt sleeping at this time.
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
## 2014-12-12 DIAGNOSIS — I6340 Cerebral infarction due to embolism of unspecified cerebral artery: Secondary | ICD-10-CM | POA: Insufficient documentation

## 2014-12-12 HISTORY — DX: Cerebral infarction due to embolism of unspecified cerebral artery: I63.40

## 2015-07-02 DIAGNOSIS — Z982 Presence of cerebrospinal fluid drainage device: Secondary | ICD-10-CM | POA: Insufficient documentation

## 2015-08-10 DIAGNOSIS — Z1211 Encounter for screening for malignant neoplasm of colon: Secondary | ICD-10-CM | POA: Insufficient documentation

## 2015-08-10 DIAGNOSIS — Z8719 Personal history of other diseases of the digestive system: Secondary | ICD-10-CM | POA: Insufficient documentation

## 2017-06-02 DIAGNOSIS — E785 Hyperlipidemia, unspecified: Secondary | ICD-10-CM | POA: Insufficient documentation

## 2017-06-02 DIAGNOSIS — D649 Anemia, unspecified: Secondary | ICD-10-CM | POA: Insufficient documentation

## 2017-06-02 DIAGNOSIS — Z8673 Personal history of transient ischemic attack (TIA), and cerebral infarction without residual deficits: Secondary | ICD-10-CM | POA: Insufficient documentation

## 2017-06-04 DIAGNOSIS — K315 Obstruction of duodenum: Secondary | ICD-10-CM | POA: Insufficient documentation

## 2017-06-04 DIAGNOSIS — K209 Esophagitis, unspecified without bleeding: Secondary | ICD-10-CM | POA: Insufficient documentation

## 2019-03-11 ENCOUNTER — Emergency Department (HOSPITAL_COMMUNITY): Payer: Medicare Other

## 2019-03-11 ENCOUNTER — Inpatient Hospital Stay (HOSPITAL_COMMUNITY)
Admission: EM | Admit: 2019-03-11 | Discharge: 2019-03-14 | DRG: 378 | Disposition: A | Discharge status: Home / Self Care | Payer: Medicare Other | Attending: Internal Medicine | Admitting: Internal Medicine

## 2019-03-11 DIAGNOSIS — Z833 Family history of diabetes mellitus: Secondary | ICD-10-CM | POA: Diagnosis not present

## 2019-03-11 DIAGNOSIS — R200 Anesthesia of skin: Secondary | ICD-10-CM | POA: Diagnosis present

## 2019-03-11 DIAGNOSIS — Z888 Allergy status to other drugs, medicaments and biological substances status: Secondary | ICD-10-CM | POA: Diagnosis not present

## 2019-03-11 DIAGNOSIS — R7989 Other specified abnormal findings of blood chemistry: Secondary | ICD-10-CM | POA: Diagnosis present

## 2019-03-11 DIAGNOSIS — Z9889 Other specified postprocedural states: Secondary | ICD-10-CM | POA: Diagnosis not present

## 2019-03-11 DIAGNOSIS — K311 Adult hypertrophic pyloric stenosis: Secondary | ICD-10-CM | POA: Diagnosis present

## 2019-03-11 DIAGNOSIS — R29818 Other symptoms and signs involving the nervous system: Secondary | ICD-10-CM | POA: Diagnosis present

## 2019-03-11 DIAGNOSIS — F419 Anxiety disorder, unspecified: Secondary | ICD-10-CM | POA: Diagnosis not present

## 2019-03-11 DIAGNOSIS — R6881 Early satiety: Secondary | ICD-10-CM | POA: Diagnosis present

## 2019-03-11 DIAGNOSIS — D500 Iron deficiency anemia secondary to blood loss (chronic): Secondary | ICD-10-CM | POA: Diagnosis present

## 2019-03-11 DIAGNOSIS — I10 Essential (primary) hypertension: Secondary | ICD-10-CM | POA: Diagnosis present

## 2019-03-11 DIAGNOSIS — F418 Other specified anxiety disorders: Secondary | ICD-10-CM | POA: Diagnosis present

## 2019-03-11 DIAGNOSIS — Z885 Allergy status to narcotic agent status: Secondary | ICD-10-CM | POA: Diagnosis not present

## 2019-03-11 DIAGNOSIS — Z982 Presence of cerebrospinal fluid drainage device: Secondary | ICD-10-CM

## 2019-03-11 DIAGNOSIS — R109 Unspecified abdominal pain: Secondary | ICD-10-CM

## 2019-03-11 DIAGNOSIS — Z79899 Other long term (current) drug therapy: Secondary | ICD-10-CM | POA: Diagnosis not present

## 2019-03-11 DIAGNOSIS — D649 Anemia, unspecified: Secondary | ICD-10-CM

## 2019-03-11 DIAGNOSIS — D62 Acute posthemorrhagic anemia: Secondary | ICD-10-CM | POA: Diagnosis not present

## 2019-03-11 DIAGNOSIS — K254 Chronic or unspecified gastric ulcer with hemorrhage: Secondary | ICD-10-CM | POA: Diagnosis present

## 2019-03-11 DIAGNOSIS — Z7982 Long term (current) use of aspirin: Secondary | ICD-10-CM

## 2019-03-11 DIAGNOSIS — R531 Weakness: Secondary | ICD-10-CM | POA: Diagnosis not present

## 2019-03-11 DIAGNOSIS — K922 Gastrointestinal hemorrhage, unspecified: Secondary | ICD-10-CM | POA: Diagnosis present

## 2019-03-11 DIAGNOSIS — K2270 Barrett's esophagus without dysplasia: Secondary | ICD-10-CM | POA: Diagnosis present

## 2019-03-11 DIAGNOSIS — E869 Volume depletion, unspecified: Secondary | ICD-10-CM | POA: Diagnosis present

## 2019-03-11 DIAGNOSIS — R630 Anorexia: Secondary | ICD-10-CM | POA: Diagnosis present

## 2019-03-11 DIAGNOSIS — N179 Acute kidney failure, unspecified: Secondary | ICD-10-CM | POA: Diagnosis present

## 2019-03-11 DIAGNOSIS — Z886 Allergy status to analgesic agent status: Secondary | ICD-10-CM | POA: Diagnosis not present

## 2019-03-11 DIAGNOSIS — I69354 Hemiplegia and hemiparesis following cerebral infarction affecting left non-dominant side: Secondary | ICD-10-CM | POA: Diagnosis not present

## 2019-03-11 DIAGNOSIS — K219 Gastro-esophageal reflux disease without esophagitis: Secondary | ICD-10-CM | POA: Diagnosis present

## 2019-03-11 DIAGNOSIS — Z20828 Contact with and (suspected) exposure to other viral communicable diseases: Secondary | ICD-10-CM | POA: Diagnosis present

## 2019-03-11 DIAGNOSIS — K449 Diaphragmatic hernia without obstruction or gangrene: Secondary | ICD-10-CM | POA: Diagnosis present

## 2019-03-11 DIAGNOSIS — K259 Gastric ulcer, unspecified as acute or chronic, without hemorrhage or perforation: Secondary | ICD-10-CM | POA: Diagnosis present

## 2019-03-11 DIAGNOSIS — F329 Major depressive disorder, single episode, unspecified: Secondary | ICD-10-CM | POA: Diagnosis not present

## 2019-03-11 LAB — CBC
HCT: 13.2 % — ABNORMAL LOW (ref 36.0–46.0)
Hemoglobin: 3.8 g/dL — CL (ref 12.0–15.0)
MCH: 25.9 pg — ABNORMAL LOW (ref 26.0–34.0)
MCHC: 28.8 g/dL — ABNORMAL LOW (ref 30.0–36.0)
MCV: 89.8 fL (ref 80.0–100.0)
Platelets: 259 10*3/uL (ref 150–400)
RBC: 1.47 MIL/uL — ABNORMAL LOW (ref 3.87–5.11)
RDW: 15.7 % — ABNORMAL HIGH (ref 11.5–15.5)
WBC: 7 10*3/uL (ref 4.0–10.5)
nRBC: 0.4 % — ABNORMAL HIGH (ref 0.0–0.2)

## 2019-03-11 LAB — PREPARE RBC (CROSSMATCH)

## 2019-03-11 LAB — COMPREHENSIVE METABOLIC PANEL
ALT: 13 U/L (ref 0–44)
AST: 12 U/L — ABNORMAL LOW (ref 15–41)
Albumin: 3.9 g/dL (ref 3.5–5.0)
Alkaline Phosphatase: 68 U/L (ref 38–126)
Anion gap: 9 (ref 5–15)
BUN: 44 mg/dL — ABNORMAL HIGH (ref 8–23)
CO2: 18 mmol/L — ABNORMAL LOW (ref 22–32)
Calcium: 9.1 mg/dL (ref 8.9–10.3)
Chloride: 111 mmol/L (ref 98–111)
Creatinine, Ser: 1.96 mg/dL — ABNORMAL HIGH (ref 0.44–1.00)
GFR calc Af Amer: 29 mL/min — ABNORMAL LOW (ref >60)
GFR calc non Af Amer: 25 mL/min — ABNORMAL LOW (ref >60)
Glucose, Bld: 115 mg/dL — ABNORMAL HIGH (ref 70–99)
Potassium: 4.5 mmol/L (ref 3.5–5.1)
Sodium: 138 mmol/L (ref 135–145)
Total Bilirubin: 0.4 mg/dL (ref 0.3–1.2)
Total Protein: 6.8 g/dL (ref 6.5–8.1)

## 2019-03-11 LAB — ABO/RH: ABO/RH(D): A POS

## 2019-03-11 LAB — SARS CORONAVIRUS 2 BY RT PCR (HOSPITAL ORDER, PERFORMED IN ~~LOC~~ HOSPITAL LAB): SARS Coronavirus 2: NEGATIVE

## 2019-03-11 MED ORDER — FAMOTIDINE IN NACL 20-0.9 MG/50ML-% IV SOLN
20.0000 mg | INTRAVENOUS | Status: AC
  Administered 2019-03-11: 20 mg via INTRAVENOUS
  Filled 2019-03-11: qty 50

## 2019-03-11 MED ORDER — HYDROMORPHONE HCL 1 MG/ML IJ SOLN
0.5000 mg | INTRAMUSCULAR | Status: DC | PRN
  Administered 2019-03-11: 0.5 mg via INTRAVENOUS
  Filled 2019-03-11: qty 1

## 2019-03-11 MED ORDER — HYDROMORPHONE HCL 1 MG/ML IJ SOLN
0.2500 mg | INTRAMUSCULAR | Status: AC | PRN
  Administered 2019-03-11: 0.25 mg via INTRAVENOUS
  Filled 2019-03-11: qty 1

## 2019-03-11 MED ORDER — FOLIC ACID 1 MG PO TABS
1.0000 mg | ORAL_TABLET | Freq: Every day | ORAL | Status: DC
  Administered 2019-03-12 – 2019-03-14 (×3): 1 mg via ORAL
  Filled 2019-03-11 (×3): qty 1

## 2019-03-11 MED ORDER — VITAMIN B-1 100 MG PO TABS
100.0000 mg | ORAL_TABLET | Freq: Every day | ORAL | Status: DC
  Administered 2019-03-12 – 2019-03-14 (×3): 100 mg via ORAL
  Filled 2019-03-11 (×3): qty 1

## 2019-03-11 MED ORDER — DULOXETINE HCL 60 MG PO CPEP
60.0000 mg | ORAL_CAPSULE | Freq: Every day | ORAL | Status: DC
  Administered 2019-03-12 – 2019-03-14 (×3): 60 mg via ORAL
  Filled 2019-03-11 (×5): qty 1

## 2019-03-11 MED ORDER — LABETALOL HCL 5 MG/ML IV SOLN
5.0000 mg | Freq: Once | INTRAVENOUS | Status: AC
  Administered 2019-03-11: 5 mg via INTRAVENOUS
  Filled 2019-03-11: qty 4

## 2019-03-11 MED ORDER — ONDANSETRON HCL 4 MG/2ML IJ SOLN
4.0000 mg | Freq: Once | INTRAMUSCULAR | Status: AC
  Administered 2019-03-11: 19:00:00 4 mg via INTRAVENOUS
  Filled 2019-03-11: qty 2

## 2019-03-11 MED ORDER — SODIUM CHLORIDE 0.9 % IV SOLN
80.0000 mg | Freq: Once | INTRAVENOUS | Status: AC
  Administered 2019-03-11: 80 mg via INTRAVENOUS
  Filled 2019-03-11: qty 80

## 2019-03-11 MED ORDER — HYDROXYZINE HCL 25 MG PO TABS: 25.0000 mg | ORAL_TABLET | Freq: Every day | ORAL | Status: DC

## 2019-03-11 MED ORDER — SODIUM CHLORIDE 0.9 % IV SOLN
INTRAVENOUS | Status: DC
  Administered 2019-03-11: 22:00:00 via INTRAVENOUS

## 2019-03-11 MED ORDER — OCTREOTIDE LOAD VIA INFUSION
100.0000 ug | Freq: Once | INTRAVENOUS | Status: AC
  Administered 2019-03-11: 100 ug via INTRAVENOUS
  Filled 2019-03-11: qty 50

## 2019-03-11 MED ORDER — ONDANSETRON 4 MG PO TBDP: 4.0000 mg | ORAL_TABLET | Freq: Three times a day (TID) | ORAL | Status: DC | PRN

## 2019-03-11 MED ORDER — ADULT MULTIVITAMIN W/MINERALS CH
1.0000 | ORAL_TABLET | Freq: Every day | ORAL | Status: DC
  Administered 2019-03-12 – 2019-03-14 (×3): 1 via ORAL
  Filled 2019-03-11 (×3): qty 1

## 2019-03-11 MED ORDER — SODIUM CHLORIDE 0.9% IV SOLUTION
Freq: Once | INTRAVENOUS | Status: AC
  Administered 2019-03-11: 16:00:00 via INTRAVENOUS

## 2019-03-11 MED ORDER — HYDRALAZINE HCL 20 MG/ML IJ SOLN
5.0000 mg | INTRAMUSCULAR | Status: DC | PRN
  Administered 2019-03-11 – 2019-03-12 (×2): 5 mg via INTRAVENOUS
  Filled 2019-03-11 (×2): qty 1

## 2019-03-11 MED ORDER — ATORVASTATIN CALCIUM 10 MG PO TABS: 10.0000 mg | ORAL_TABLET | Freq: Every day | ORAL | Status: DC

## 2019-03-11 MED ORDER — SODIUM CHLORIDE 0.9 % IV SOLN
8.0000 mg/h | INTRAVENOUS | Status: DC
  Administered 2019-03-11 – 2019-03-12 (×2): 8 mg/h via INTRAVENOUS
  Filled 2019-03-11 (×3): qty 80

## 2019-03-11 MED ORDER — SODIUM CHLORIDE 0.9 % IV SOLN
50.0000 ug/h | INTRAVENOUS | Status: DC
  Administered 2019-03-11 – 2019-03-12 (×2): 50 ug/h via INTRAVENOUS
  Filled 2019-03-11 (×3): qty 1

## 2019-03-11 NOTE — ED Provider Notes (Signed)
[facility] EMERGENCY DEPARTMENT Provider Note   CSN: [id number] Arrival date & time: 03/11/19  1341     History   Chief Complaint Chief Complaint  Patient presents with  . Abdominal Pain    HPI Tulsi Allers is a 73 y.o. female.     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  She currently does not take any anticoagulants, she has a VP shunt, she has a history of gastric ulcers and has had to be admitted and scoped and transfused in the past, she also reports that she gets her primary care in [facility].  She does not recall the name of her gastroenterologist, she last saw Dr. Concha Se in 2008 in our records.  She states that over the last week she has had some progressive dyspnea on exertion, progressive anemia based on her feeling of being pale, weak, fatigued and has now had multiple episodes of coffee-ground emesis as well as dark melanotic stools.  Symptoms are persistent, severe, gradually worsening, she also has associated right-sided numbness over the last week, this involves her arm leg and face, she did not come to medical attention for this.  She is right-handed and notes that she has been having some difficulty using her right hand over the last week.  Her last seen normal was over 1 week ago.  She is a very poor historian but is able to tell me that it is been multiple days since she had noticed the weakness and numbness.  She denies any head injury.  Past Medical History:  Diagnosis Date  . Brain aneurysm   . Depression   . Hypertension     Patient Active Problem List   Diagnosis Date Noted  . Caregiver with fatigue 09/09/2014  . Adjustment disorder with depressed mood 09/09/2014    Past Surgical History:  Procedure Laterality Date  . ABDOMINAL SURGERY     fluid tumor removal  . ABDOMINAL SURGERY     bleeding ulcers   . CEREBRAL ANEURYSM REPAIR    . CSF SHUNT    . ROTATOR CUFF REPAIR       OB History   No obstetric history on file.      Home Medications    Prior to Admission medications   Medication Sig Start Date End Date Taking? Authorizing Provider  atorvastatin (LIPITOR) 10 MG tablet Take 10 mg by mouth daily. 09/06/14   [provider]  BYSTOLIC 10 MG tablet Take 10 mg by mouth daily. 08/15/14   [provider]  cyclobenzaprine (FLEXERIL) 5 MG tablet Take 10 mg by mouth 3 (three) times daily as needed. 08/15/14   [provider]  DULoxetine (CYMBALTA) 60 MG capsule Take 60 mg by mouth daily. 08/09/14   [provider]  hydrOXYzine (ATARAX/VISTARIL) 25 MG tablet Take 1 tablet (25 mg total) by mouth at bedtime. 09/09/14   Charm Rings, NP  naproxen (NAPROSYN) 375 MG tablet Take 1 tablet (375 mg total) by mouth 2 (two) times daily with a meal. 09/09/14   Charm Rings, NP  pantoprazole (PROTONIX) 20 MG tablet Take 1 tablet (20 mg total) by mouth 2 (two) times daily before a meal. 09/09/14   Charm Rings, NP  Vitamin D, Ergocalciferol, (DRISDOL) 50000 UNITS CAPS capsule Take 50,000 Units by mouth once a week. 09/03/14   [provider]  Family History No family history on file.  Social History Social History   Tobacco Use  . Smoking status: Never Smoker  Substance Use Topics  . Alcohol use: No  . Drug use: No     Allergies   Codeine and Dilantin [phenytoin]   Review of Systems Review of Systems  All other systems reviewed and are negative.    Physical Exam Updated Vital Signs BP (!) 156/53 (BP Location: Left Arm)   Pulse 65   Temp 98.6 F (37 C) (Oral)   Resp 20   Ht 1.524 m (5')   Wt 59 kg   SpO2 100%   BMI 25.39 kg/m   Physical Exam Vitals signs and nursing note reviewed.  Constitutional:      Appearance: She is well-developed. She is ill-appearing. She is not diaphoretic.  HENT:     Head: Normocephalic and  atraumatic.     Mouth/Throat:     Pharynx: No oropharyngeal exudate.  Eyes:     General: No scleral icterus.       Right eye: No discharge.        Left eye: No discharge.     Conjunctiva/sclera: Conjunctivae normal.     Pupils: Pupils are equal, round, and reactive to light.     Comments: Conjunctive are very pale  Neck:     Musculoskeletal: Normal range of motion and neck supple.     Thyroid: No thyromegaly.     Vascular: No JVD.  Cardiovascular:     Rate and Rhythm: Normal rate and regular rhythm.     Heart sounds: Murmur ( Soft systolic murmur) present. No friction rub. No gallop.   Pulmonary:     Effort: Pulmonary effort is normal. No respiratory distress.     Breath sounds: Normal breath sounds. No wheezing or rales.  Abdominal:     General: Bowel sounds are normal. There is no distension.     Palpations: Abdomen is soft. There is no mass.     Tenderness: There is no abdominal tenderness.     Comments: The abdomen has normal bowel sounds and there is absolutely no tenderness, very soft  Musculoskeletal: Normal range of motion.        General: No tenderness.  Lymphadenopathy:     Cervical: No cervical adenopathy.  Skin:    General: Skin is warm and dry.     Coloration: Skin is pale.     Findings: No erythema or rash.  Neurological:     Mental Status: She is alert.     Coordination: Coordination normal.     Comments: The patient is able to lift all 4 extremities but has a slight weakness to the right grip and dysmetria with finger-nose-finger on the right.  Speech is clear, sensory deficits involve the right face right arm and right leg compared to the left side.  She does have some extinction to the right leg  Psychiatric:        Behavior: Behavior normal.      ED Treatments / Results  Labs (all labs ordered are listed, but only abnormal results are displayed) Labs Reviewed  COMPREHENSIVE METABOLIC PANEL - Abnormal; Notable for the following components:      Result  Value   CO2 18 (*)    Glucose, Bld 115 (*)    BUN 44 (*)    Creatinine, Ser 1.96 (*)    AST 12 (*)    GFR calc non Af Amer 25 (*)  GFR calc Af Amer 29 (*)    All other components within normal limits  CBC - Abnormal; Notable for the following components:   RBC 1.47 (*)    Hemoglobin 3.8 (*)    HCT 13.2 (*)    MCH 25.9 (*)    MCHC 28.8 (*)    RDW 15.7 (*)    nRBC 0.4 (*)    All other components within normal limits  SARS CORONAVIRUS 2 (HOSPITAL ORDER, PERFORMED IN [facility])  POC OCCULT BLOOD, ED  TYPE AND SCREEN  ABO/RH  PREPARE RBC (CROSSMATCH)    EKG None  Radiology No results found.  Procedures .Critical Care Performed by: Eber Hong, MD Authorized by: Eber Hong, MD   Critical care provider statement:    Critical care time (minutes):  35   Critical care time was exclusive of:  Separately billable procedures and treating other patients and teaching time   Critical care was necessary to treat or prevent imminent or life-threatening deterioration of the following conditions: upper GI bleed.   Critical care was time spent personally by me on the following activities:  Blood draw for specimens, development of treatment plan with patient or surrogate, discussions with consultants, evaluation of patient's response to treatment, examination of patient, obtaining history from patient or surrogate, ordering and performing treatments and interventions, ordering and review of laboratory studies, ordering and review of radiographic studies, pulse oximetry, re-evaluation of patient's condition and review of old charts Comments:         (including critical care time)  Medications Ordered in ED Medications  0.9 %  sodium chloride infusion (Manually program via Guardrails IV Fluids) (has no administration in time range)  pantoprazole (PROTONIX) 80 mg in sodium chloride 0.9 % 100 mL IVPB (has no administration in time range)  famotidine (PEPCID) IVPB 20 mg  premix (has no administration in time range)  octreotide (SANDOSTATIN) 2 mcg/mL load via infusion 100 mcg (has no administration in time range)    And  octreotide (SANDOSTATIN) 500 mcg in sodium chloride 0.9 % 250 mL (2 mcg/mL) infusion (has no administration in time range)  pantoprazole (PROTONIX) 80 mg in sodium chloride 0.9 % 250 mL (0.32 mg/mL) infusion (has no administration in time range)     Initial Impression / Assessment and Plan / ED Course  I have reviewed the triage vital signs and the nursing notes.  Pertinent labs & imaging results that were available during my care of the patient were reviewed by me and considered in my medical decision making (see chart for details).        The patient is critically ill, she has a hemoglobin of 3.8, her creatinine is 1.96, her prior creatinine in 2016 was 0.89.  Her prior hemoglobin in 2016 was 13.5.  Given her upper GI anticipated bleed she will be started on high-dose Protonix, she will be given Pepcid, 2 large-bore IVs, I suspect Hemoccult will be positive and she will need consultations from gastroenterology, admission, transfusion and possibly a neurologic work-up for the unilateral numbness and dysmetria of the right arm which seems to be new this week as well.  This is not a code stroke, her symptoms started well over 24 hours ago and with her severe anemia and her history of cerebral aneurysm she would not be a candidate for thrombolytic therapy.  Would also consider shunt malfunction as a cause of her focal neurologic symptoms, CT scan pending  EKG performed on March 11, 2019 at  3:18 PM shows sinus rhythm, prolonged PR interval consistent with first-degree AV block, normal axis, QRS is widened with a rate of 104 and a nonspecific intraventricular conduction delay.  There is signs of left ventricular hypertrophy and nonspecific ST and T waves.  D/w DR. Marca AnconaKarki - will see in consultation -agreeable to octreotide drip, Protonix drip, CT  scan pending, will admit to hospitalist.  The patient is critically ill.  Discussed with internal medicine resident who will admit to the hospital  Final Clinical Impressions(s) / ED Diagnoses   Final diagnoses:  Gastrointestinal hemorrhage associated with gastric ulcer  Severe anemia  Focal neurological deficit    ED Discharge Orders    None       Eber HongMiller, Keeyon Privitera, MD 03/11/19 1554

## 2019-03-11 NOTE — ED Notes (Addendum)
Ortho's BP's: Pt. Is currently unable to stand, I had to pickup her up to get into bed initially.  l will try again later after transfusion of blood.

## 2019-03-11 NOTE — ED Triage Notes (Signed)
Pt presents with Right side abd pain, N/V and dark stool x1 week. Hx of ulcers. Pt also c/o Right leg numbness from her hip to her toes x2 days. No neuro deficits noted

## 2019-03-11 NOTE — H&P (View-Only) (Signed)
[facility] Consult  Referring Provider: Eber Hong, MD/ER Primary Care Physician:  Patient, No Pcp Per Primary Gastroenterologist: Gentry Fitz  Reason for Consultation: Symptomatic anemia, melena, coffee-ground emesis, hematemesis  HPI: Kristen Campbell is a 73 y.o. female with prior history of brain aneurysm, stroke, left-sided weakness, on aspirin 81 mg a day, history of bleeding gastric ulcers 5 years ago was brought to the ED by EMS after she reported progressively worsening shortness of breath, severe fatigue.  3 days ago she noticed black stools followed by few episodes of coffee-ground emesis and small amount of vomiting of fresh blood.  Patient reports that last bowel movement was yesterday evening. Patient occasionally takes NSAIDs for generalized body pain, reports taking ibuprofen few days ago. She has lost about 15 pounds in the last 1 year which she reports is due to loss of appetite since her husband passed away.  She has also had problems with swallowing, which is nonprogressive, mostly with solids, not associated with pain on swallowing.  She complains of early satiety.  She has intermittent acid reflux and heartburn, and used to be on PPI intermittently. Patient reports that her last colonoscopy was over 20 years ago. In the ED she was found to have a hemoglobin of 3.8, was relatively stable hemodynamically, however was not able to get up for orthostatic vitals. Patient denies chronic liver disease, denies history of alcohol use/abuse. Patient also complains of exertional chest pain and lightheadedness although she denies loss of consciousness.   Past Medical History:  Diagnosis Date  . Brain aneurysm   . Depression   . Hypertension     Past Surgical History:  Procedure Laterality Date  . ABDOMINAL SURGERY     fluid tumor removal  . ABDOMINAL SURGERY     bleeding ulcers  . CEREBRAL ANEURYSM REPAIR    . CSF SHUNT    . ROTATOR CUFF REPAIR      Prior to  Admission medications   Medication Sig Start Date End Date Taking? Authorizing Provider  atorvastatin (LIPITOR) 10 MG tablet Take 10 mg by mouth daily. 09/06/14   [provider]  BYSTOLIC 10 MG tablet Take 10 mg by mouth daily. 08/15/14   [provider]  cyclobenzaprine (FLEXERIL) 5 MG tablet Take 10 mg by mouth 3 (three) times daily as needed. 08/15/14   [provider]  DULoxetine (CYMBALTA) 60 MG capsule Take 60 mg by mouth daily. 08/09/14   [provider]  hydrOXYzine (ATARAX/VISTARIL) 25 MG tablet Take 1 tablet (25 mg total) by mouth at bedtime. 09/09/14   Charm Rings, NP  naproxen (NAPROSYN) 375 MG tablet Take 1 tablet (375 mg total) by mouth 2 (two) times daily with a meal. 09/09/14   Charm Rings, NP  pantoprazole (PROTONIX) 20 MG tablet Take 1 tablet (20 mg total) by mouth 2 (two) times daily before a meal. 09/09/14   Charm Rings, NP  Vitamin D, Ergocalciferol, (DRISDOL) 50000 UNITS CAPS capsule Take 50,000 Units by mouth once a week. 09/03/14   [provider]    Current Facility-Administered Medications  Medication Dose Route Frequency Provider Last Rate Last Dose  . famotidine (PEPCID) IVPB 20 mg premix  20 mg Intravenous STAT Eber Hong, MD      . octreotide (SANDOSTATIN) 2 mcg/mL load via infusion 100 mcg  100 mcg Intravenous Once Eber Hong, MD       And  . octreotide (SANDOSTATIN) 500 mcg in sodium chloride 0.9 % 250 mL (2 mcg/mL)  infusion  50 mcg/hr Intravenous Continuous Eber Hong, MD      . pantoprazole (PROTONIX) 80 mg in sodium chloride 0.9 % 250 mL (0.32 mg/mL) infusion  8 mg/hr Intravenous Continuous Eber Hong, MD       Current Outpatient Medications  Medication Sig Dispense Refill  . atorvastatin (LIPITOR) 10 MG tablet Take 10 mg by mouth daily.  0  . BYSTOLIC 10 MG tablet Take 10 mg by mouth daily.  0  . cyclobenzaprine (FLEXERIL) 5 MG tablet Take 10 mg by mouth 3 (three) times daily as needed.  0  .  DULoxetine (CYMBALTA) 60 MG capsule Take 60 mg by mouth daily.  0  . hydrOXYzine (ATARAX/VISTARIL) 25 MG tablet Take 1 tablet (25 mg total) by mouth at bedtime. 30 tablet 0  . naproxen (NAPROSYN) 375 MG tablet Take 1 tablet (375 mg total) by mouth 2 (two) times daily with a meal. 60 tablet 0  . pantoprazole (PROTONIX) 20 MG tablet Take 1 tablet (20 mg total) by mouth 2 (two) times daily before a meal. 60 tablet 0  . Vitamin D, Ergocalciferol, (DRISDOL) 50000 UNITS CAPS capsule Take 50,000 Units by mouth once a week.  0    Allergies as of 03/11/2019 - Review Complete 09/08/2014  Allergen Reaction Noted  . Codeine Nausea And Vomiting 09/08/2014  . Dilantin [phenytoin] Other (See Comments) 09/08/2014    No family history on file.  Social History   Socioeconomic History  . Marital status: Married    Spouse name: Not on file  . Number of children: Not on file  . Years of education: Not on file  . Highest education level: Not on file  Occupational History  . Not on file  Social Needs  . Financial resource strain: Not on file  . Food insecurity    Worry: Not on file    Inability: Not on file  . Transportation needs    Medical: Not on file    Non-medical: Not on file  Tobacco Use  . Smoking status: Never Smoker  Substance and Sexual Activity  . Alcohol use: No  . Drug use: No  . Sexual activity: Not on file  Lifestyle  . Physical activity    Days per week: Not on file    Minutes per session: Not on file  . Stress: Not on file  Relationships  . Social Musician on phone: Not on file    Gets together: Not on file    Attends religious service: Not on file    Active member of club or organization: Not on file    Attends meetings of clubs or organizations: Not on file    Relationship status: Not on file  . Intimate partner violence    Fear of current or ex partner: Not on file    Emotionally abused: Not on file    Physically abused: Not on file    Forced sexual  activity: Not on file  Other Topics Concern  . Not on file  Social History Narrative  . Not on file    Review of Systems: Positive for: GI: Described in detail in HPI.    Gen: anorexia, fatigue, weakness, malaise, involuntary weight loss, Denies any fever, chills, rigors, night sweats, and sleep disorder CV: chest pain,Denies  angina, palpitations, syncope, orthopnea, PND, peripheral edema, and claudication. Resp: dyspnea,Denies  cough, sputum, wheezing, coughing up blood. GU : Denies urinary burning, blood in urine, urinary frequency, urinary hesitancy, nocturnal urination,  and urinary incontinence. MS: Denies joint pain or swelling.  Denies muscle weakness, cramps, atrophy.  Derm: Denies rash, itching, oral ulcerations, hives, unhealing ulcers.  Psych: Denies depression, anxiety, memory loss, suicidal ideation, hallucinations,  and confusion. Heme: Denies bruising and enlarged lymph nodes. Neuro:  dizziness, Denies any headaches, paresthesias. Endo:  Denies any problems with DM, thyroid, adrenal function.  Physical Exam: Vital signs in last 24 hours: Temp:  [98.4 F (36.9 C)-98.6 F (37 C)] 98.4 F (36.9 C) (09/26 1639) Pulse Rate:  [63-71] 71 (09/26 1639) Resp:  [16-20] 16 (09/26 1639) BP: (125-171)/(52-59) 171/52 (09/26 1639) SpO2:  [100 %] 100 % (09/26 1639) Weight:  [59 kg] 59 kg (09/26 1346)    General:   Frail-appearing, pleasant and cooperative in NAD Head:  Normocephalic and atraumatic. Eyes:  Sclera clear, no icterus.   Pro minent pallor  ears:  Normal auditory acuity. Nose:  No deformity, discharge,  or lesions. Mouth:  No deformity or lesions.  Oropharynx pink & moist. Neck:  Supple; no masses or thyromegaly. Lungs:  Clear throughout to auscultation.   No wheezes, crackles, or rhonchi. No acute distress. Heart:  Regular rate and rhythm; no murmurs, clicks, rubs,  or gallops. Extremities:  Without clubbing or edema. Neurologic:  Alert and  oriented x4;  grossly  normal neurologically. Skin:  Intact without significant lesions or rashes. Psych:  Alert and cooperative. Normal mood and affect. Abdomen: Healed surgical scars, soft, nontender and nondistended. No masses, hepatosplenomegaly or hernias noted. Normal bowel sounds, without guarding, and without rebound.         Lab Results: Recent Labs    03/11/19 1407  WBC 7.0  HGB 3.8*  HCT 13.2*  PLT 259   BMET Recent Labs    03/11/19 1407  NA 138  K 4.5  CL 111  CO2 18*  GLUCOSE 115*  BUN 44*  CREATININE 1.96*  CALCIUM 9.1   LFT Recent Labs    03/11/19 1407  PROT 6.8  ALBUMIN 3.9  AST 12*  ALT 13  ALKPHOS 68  BILITOT 0.4   PT/INR No results for input(s): LABPROT, INR in the last 72 hours.  Studies/Results: Ct Head Wo Contrast  Result Date: 03/11/2019 CLINICAL DATA:  Focal neural deficit.  Possible stroke. EXAM: CT HEAD WITHOUT CONTRAST TECHNIQUE: Contiguous axial images were obtained from the base of the skull through the vertex without intravenous contrast. COMPARISON:  CT scan of April 19, 2018. FINDINGS: Brain: Ventriculostomy catheter tip is seen in right frontal horn which is not significantly changed compared to prior exam. No hydrocephalus is noted. No mass effect or midline shift is noted. No hemorrhage, acute infarction or mass lesion is noted. Small chronic ischemic white matter disease is noted. Vascular: No hyperdense vessel or unexpected calcification. Skull: Right posterior parietal ventriculostomy is noted. No acute abnormality is noted. Sinuses/Orbits: No acute finding. Other: None. IMPRESSION: Stable position of right-sided ventriculostomy catheter with tip in right frontal horn. No hydrocephalus is noted. Small chronic ischemic white matter disease. No acute intracranial abnormality seen. Electronically Signed   By: Lupita RaiderJames  Green Jr M.D.   On: 03/11/2019 16:00    Impression: Severe symptomatic anemia with history of black stools, coffee-ground emesis and small  amount of vomiting bright red blood. Hemoglobin 3.8 on presentation with elevated BUN/creatinine ratio 44/1.96 History of peptic ulcer disease and NSAID use, also on aspirin 81 mg a day To receive 2 unit PRBC transfusion  Plan: Clear liquid diet today, n.p.o. post midnight  for EGD in a.m.. Agree with IV Protonix drip as well as octreotide until etiology of upper GI bleeding is determined. Monitor H&H and transfuse to keep hemoglobin above 7. The risks and the benefits of the procedure were discussed with the patient in details, she verbalized understanding and consents.   LOS: 0 days   Ronnette Juniper, MD  03/11/2019, 4:54 PM  Pager [phone] If no answer or after 5 PM call [phone]

## 2019-03-11 NOTE — H&P (Addendum)
Date: 03/11/2019               Patient Name:  Kristen Campbell MRN: [id number]  DOB: 1945-08-29 Age / Sex: 73 y.o., female   PCP: Patient, No Pcp Per         Medical Service: Internal Medicine Teaching Service         Attending Physician: Dr. Rebeca Alert, Raynaldo Opitz, MD    First Contact: Dr. Jeanmarie Hubert, MD Pager: [phone]  Second Contact: Dr. Kathi Ludwig, MD Pager: [phone]       After Hours (After 5p/  First Contact Pager: [phone]  weekends / holidays): Second Contact Pager: [phone]   Chief Complaint: Shortness of breath, fatigue  History of Present Illness: Kristen Campbell is a 73 year old female with past medical history of hypertension, gastric ulcer, anemia who presented with a 3-day history of dark stools in the setting of 2 to 3 weeks of weakness, dizziness, shortness of breath.  Patient reports episode of emesis yesterday with "coughy grounds" and bright red blood that occurred immediately with first emesis. Patient reports prior hospitalization about 4 years ago for similar symptoms which were due to bleeding gastric ulcers.  Patient denies loss of consciousness, chest pain, red blood in stools, fever, chills, cough, congestion.  Patient reports approximately 15 pound weight loss but states that her husband died about a year ago and decreased appetite started since then.  She also reports early satiety.  Reports problems with swallowing, nonprogressive, mostly with solids and not associate with pain.  Intermittent problems with acid reflux and heartburn, used to be on PPI.  Reports occasional NSAID use, reports taking daily aspirin, denies history of bacteria in the stomach, history of liver disease, or any alcohol usage.  Patient reports history of 3 C-sections as well as an abdominal surgery to remove a mass which patient says was not cancer.  Patient says that she had a colonoscopy about 20 years ago was not told about any abnormality, had endoscopy during prior  hospitalization about 4 years ago.  Patient also reports right-sided numbness involving her arm, leg, face that has been present over the past 1 week.  Patient lives with son-in-law, can walk independently with a cane.  Meds:  Current Outpatient Medications  Medication Instructions   atorvastatin (LIPITOR) 10 mg, Oral, Daily   Bystolic 10 mg, Oral, Daily   cyclobenzaprine (FLEXERIL) 10 mg, Oral, 3 times daily PRN   DULoxetine (CYMBALTA) 60 mg, Oral, Daily   hydrOXYzine (ATARAX/VISTARIL) 25 mg, Oral, Daily at bedtime   pantoprazole (PROTONIX) 20 mg, Oral, 2 times daily before meals   Vitamin D (Ergocalciferol) (DRISDOL) 50,000 Units, Oral, Weekly   Allergies: Allergies as of 03/11/2019 - Review Complete 09/08/2014  Allergen Reaction Noted   Codeine Nausea And Vomiting 09/08/2014   Dilantin [phenytoin] Other (See Comments) 09/08/2014   Past Medical History:  Diagnosis Date   Brain aneurysm    Depression    Hypertension     Family History:  * Father with diabetes mellitus  Social History:  * Denies smoking, alcohol usage, recreational drug usage * Lives with son-in-law  Review of Systems: A complete ROS was negative except as per HPI.  Physical Exam: Blood pressure (!) 171/52, pulse 71, temperature 98.4 F (36.9 C), temperature source Oral, resp. rate 16, height 5' (1.524 m), weight 59 kg, SpO2 100 %. Physical Exam  Constitutional: She is well-developed, well-nourished, and in no distress.  HENT:  Head: Normocephalic and atraumatic.  Eyes: EOM  are normal. Right eye exhibits no discharge. Left eye exhibits no discharge.  Neck: Normal range of motion. No tracheal deviation present.  Cardiovascular: Normal rate and regular rhythm. Exam reveals no gallop and no friction rub.  No murmur heard. Pulmonary/Chest: Effort normal and breath sounds normal. No respiratory distress. She has no wheezes. She has no rales.  Abdominal: Soft. She exhibits no distension. There  is abdominal tenderness (right upper quadrant abdominal tenderness to moderate palpation). There is no rebound and no guarding.  Musculoskeletal: Normal range of motion.        General: No tenderness, deformity or edema.  Neurological: She is alert. Coordination normal.  Skin: Skin is warm and dry. No rash noted. She is not diaphoretic. No erythema. There is pallor.  Psychiatric: Memory and judgment normal.    EKG: personally reviewed my interpretation is not performed  CXR: personally reviewed my interpretation is not performed  CT Head: Demonstrates stable position of right-sided ventriculostomy catheter with tip in the right frontal horn, no hydrocephalus, small chronic ischemic white matter disease, no acute intracranial abnormality.  Assessment & Plan by Problem: Active Problems:   Upper GI bleed  Patient is a 73 year old female with past medical history of hypertension, gastric ulcers, anemia who presents with signs and symptoms consistent with an upper GI bleed.  Per chart review, patient presented with a hemoglobin of 6.7 in 2012 and underwent a endoscopic evaluation in which revealed a pyloric channel ulcer.  Colonoscopy in January 2012 was unrevealing.  # Symptomatic anemia: # Upper GI Bleed: Patient presents with hemoglobin of 3.8 with symptoms of anemia, dark stools, coffee-ground emesis with bright blood.  Bleed is likely acute on chronic given duration of symptoms and hemodynamic stability with severely low hemoglobin.  Blood pressure 171/52 on last reading.  Bleeding may be secondary to gastric or duodenal ulcers for which patient has a history and consistent symptoms. * 2 units PRBCs ordered will check post-transfusion H&H, goal hemoglobin > 7, will transfuse additional unit if not at goal * Octreotide 100 mcg load with 50 mcg/h infusion * Protonix 80 mg IV load followed by Protonix 8 mg/hr * Folic acid 1 mg daily thiamine 100 mg daily, multivitamin  # Elevated creatinine:  Creatinine of 1.96, no recent baseline (0.89 in 2016).  Likely AKI in the setting of volume depletion.  We will continue to monitor.  # HTN: Patient taking nebivolol at home.  Blood pressure with mild elevation.  In the setting of GI bleed, will hold antihypertensive medications.  # Depression with anxiety: We will continue home duloxetine 60 mg daily and hydroxyzine 25 mg nightly.  # Right-sided numbness: Patient able to move all 4 extremities, slight weakness on right-sided grip.  Onset of symptoms a week ago.  CT head without evidence for acute abnormality.  Not candidate for anti-platelet therapy.  Will consider further work-up with MRI if VP shunt device is compatible following resolution of upper GI bleed.   * Continue home medication of atorvastatin 10 mg daily, will consider increase to high-dose following acute management of upper GI bleed  Dispo: Admit patient to Inpatient with expected length of stay greater than 2 midnights.  Signed: Katherine Roan, MD 03/11/2019, 5:08 PM  Pager: [phone]

## 2019-03-11 NOTE — ED Notes (Signed)
SDU  [phone] Radonna Ricker pts granddaughter called asking about status of pt

## 2019-03-11 NOTE — ED Notes (Signed)
This RN spoke with Dr. Rebeca Alert. Pt is nauseated and dry heaving. Requested IV zofran. Dr. Rebeca Alert also made aware of patients hypertension by this RN at this time.

## 2019-03-11 NOTE — Consult Note (Signed)
[facility] Consult  Referring Provider: Eber Hong, MD/ER Primary Care Physician:  Patient, No Pcp Per Primary Gastroenterologist: Gentry Fitz  Reason for Consultation: Symptomatic anemia, melena, coffee-ground emesis, hematemesis  HPI: Kristen Campbell is a 73 y.o. female with prior history of brain aneurysm, stroke, left-sided weakness, on aspirin 81 mg a day, history of bleeding gastric ulcers 5 years ago was brought to the ED by EMS after she reported progressively worsening shortness of breath, severe fatigue.  3 days ago she noticed black stools followed by few episodes of coffee-ground emesis and small amount of vomiting of fresh blood.  Patient reports that last bowel movement was yesterday evening. Patient occasionally takes NSAIDs for generalized body pain, reports taking ibuprofen few days ago. She has lost about 15 pounds in the last 1 year which she reports is due to loss of appetite since her husband passed away.  She has also had problems with swallowing, which is nonprogressive, mostly with solids, not associated with pain on swallowing.  She complains of early satiety.  She has intermittent acid reflux and heartburn, and used to be on PPI intermittently. Patient reports that her last colonoscopy was over 20 years ago. In the ED she was found to have a hemoglobin of 3.8, was relatively stable hemodynamically, however was not able to get up for orthostatic vitals. Patient denies chronic liver disease, denies history of alcohol use/abuse. Patient also complains of exertional chest pain and lightheadedness although she denies loss of consciousness.   Past Medical History:  Diagnosis Date  . Brain aneurysm   . Depression   . Hypertension     Past Surgical History:  Procedure Laterality Date  . ABDOMINAL SURGERY     fluid tumor removal  . ABDOMINAL SURGERY     bleeding ulcers  . CEREBRAL ANEURYSM REPAIR    . CSF SHUNT    . ROTATOR CUFF REPAIR      Prior to  Admission medications   Medication Sig Start Date End Date Taking? Authorizing Provider  atorvastatin (LIPITOR) 10 MG tablet Take 10 mg by mouth daily. 09/06/14   [provider]  BYSTOLIC 10 MG tablet Take 10 mg by mouth daily. 08/15/14   [provider]  cyclobenzaprine (FLEXERIL) 5 MG tablet Take 10 mg by mouth 3 (three) times daily as needed. 08/15/14   [provider]  DULoxetine (CYMBALTA) 60 MG capsule Take 60 mg by mouth daily. 08/09/14   [provider]  hydrOXYzine (ATARAX/VISTARIL) 25 MG tablet Take 1 tablet (25 mg total) by mouth at bedtime. 09/09/14   Charm Rings, NP  naproxen (NAPROSYN) 375 MG tablet Take 1 tablet (375 mg total) by mouth 2 (two) times daily with a meal. 09/09/14   Charm Rings, NP  pantoprazole (PROTONIX) 20 MG tablet Take 1 tablet (20 mg total) by mouth 2 (two) times daily before a meal. 09/09/14   Charm Rings, NP  Vitamin D, Ergocalciferol, (DRISDOL) 50000 UNITS CAPS capsule Take 50,000 Units by mouth once a week. 09/03/14   [provider]    Current Facility-Administered Medications  Medication Dose Route Frequency Provider Last Rate Last Dose  . famotidine (PEPCID) IVPB 20 mg premix  20 mg Intravenous STAT Eber Hong, MD      . octreotide (SANDOSTATIN) 2 mcg/mL load via infusion 100 mcg  100 mcg Intravenous Once Eber Hong, MD       And  . octreotide (SANDOSTATIN) 500 mcg in sodium chloride 0.9 % 250 mL (2 mcg/mL)  infusion  50 mcg/hr Intravenous Continuous Miller, Brian, MD      . pantoprazole (PROTONIX) 80 mg in sodium chloride 0.9 % 250 mL (0.32 mg/mL) infusion  8 mg/hr Intravenous Continuous Miller, Brian, MD       Current Outpatient Medications  Medication Sig Dispense Refill  . atorvastatin (LIPITOR) 10 MG tablet Take 10 mg by mouth daily.  0  . BYSTOLIC 10 MG tablet Take 10 mg by mouth daily.  0  . cyclobenzaprine (FLEXERIL) 5 MG tablet Take 10 mg by mouth 3 (three) times daily as needed.  0  .  DULoxetine (CYMBALTA) 60 MG capsule Take 60 mg by mouth daily.  0  . hydrOXYzine (ATARAX/VISTARIL) 25 MG tablet Take 1 tablet (25 mg total) by mouth at bedtime. 30 tablet 0  . naproxen (NAPROSYN) 375 MG tablet Take 1 tablet (375 mg total) by mouth 2 (two) times daily with a meal. 60 tablet 0  . pantoprazole (PROTONIX) 20 MG tablet Take 1 tablet (20 mg total) by mouth 2 (two) times daily before a meal. 60 tablet 0  . Vitamin D, Ergocalciferol, (DRISDOL) 50000 UNITS CAPS capsule Take 50,000 Units by mouth once a week.  0    Allergies as of 03/11/2019 - Review Complete 09/08/2014  Allergen Reaction Noted  . Codeine Nausea And Vomiting 09/08/2014  . Dilantin [phenytoin] Other (See Comments) 09/08/2014    No family history on file.  Social History   Socioeconomic History  . Marital status: Married    Spouse name: Not on file  . Number of children: Not on file  . Years of education: Not on file  . Highest education level: Not on file  Occupational History  . Not on file  Social Needs  . Financial resource strain: Not on file  . Food insecurity    Worry: Not on file    Inability: Not on file  . Transportation needs    Medical: Not on file    Non-medical: Not on file  Tobacco Use  . Smoking status: Never Smoker  Substance and Sexual Activity  . Alcohol use: No  . Drug use: No  . Sexual activity: Not on file  Lifestyle  . Physical activity    Days per week: Not on file    Minutes per session: Not on file  . Stress: Not on file  Relationships  . Social connections    Talks on phone: Not on file    Gets together: Not on file    Attends religious service: Not on file    Active member of club or organization: Not on file    Attends meetings of clubs or organizations: Not on file    Relationship status: Not on file  . Intimate partner violence    Fear of current or ex partner: Not on file    Emotionally abused: Not on file    Physically abused: Not on file    Forced sexual  activity: Not on file  Other Topics Concern  . Not on file  Social History Narrative  . Not on file    Review of Systems: Positive for: GI: Described in detail in HPI.    Gen: anorexia, fatigue, weakness, malaise, involuntary weight loss, Denies any fever, chills, rigors, night sweats, and sleep disorder CV: chest pain,Denies  angina, palpitations, syncope, orthopnea, PND, peripheral edema, and claudication. Resp: dyspnea,Denies  cough, sputum, wheezing, coughing up blood. GU : Denies urinary burning, blood in urine, urinary frequency, urinary hesitancy, nocturnal urination,   and urinary incontinence. MS: Denies joint pain or swelling.  Denies muscle weakness, cramps, atrophy.  Derm: Denies rash, itching, oral ulcerations, hives, unhealing ulcers.  Psych: Denies depression, anxiety, memory loss, suicidal ideation, hallucinations,  and confusion. Heme: Denies bruising and enlarged lymph nodes. Neuro:  dizziness, Denies any headaches, paresthesias. Endo:  Denies any problems with DM, thyroid, adrenal function.  Physical Exam: Vital signs in last 24 hours: Temp:  [98.4 F (36.9 C)-98.6 F (37 C)] 98.4 F (36.9 C) (09/26 1639) Pulse Rate:  [63-71] 71 (09/26 1639) Resp:  [16-20] 16 (09/26 1639) BP: (125-171)/(52-59) 171/52 (09/26 1639) SpO2:  [100 %] 100 % (09/26 1639) Weight:  [59 kg] 59 kg (09/26 1346)    General:   Frail-appearing, pleasant and cooperative in NAD Head:  Normocephalic and atraumatic. Eyes:  Sclera clear, no icterus.   Pro minent pallor  ears:  Normal auditory acuity. Nose:  No deformity, discharge,  or lesions. Mouth:  No deformity or lesions.  Oropharynx pink & moist. Neck:  Supple; no masses or thyromegaly. Lungs:  Clear throughout to auscultation.   No wheezes, crackles, or rhonchi. No acute distress. Heart:  Regular rate and rhythm; no murmurs, clicks, rubs,  or gallops. Extremities:  Without clubbing or edema. Neurologic:  Alert and  oriented x4;  grossly  normal neurologically. Skin:  Intact without significant lesions or rashes. Psych:  Alert and cooperative. Normal mood and affect. Abdomen: Healed surgical scars, soft, nontender and nondistended. No masses, hepatosplenomegaly or hernias noted. Normal bowel sounds, without guarding, and without rebound.         Lab Results: Recent Labs    03/11/19 1407  WBC 7.0  HGB 3.8*  HCT 13.2*  PLT 259   BMET Recent Labs    03/11/19 1407  NA 138  K 4.5  CL 111  CO2 18*  GLUCOSE 115*  BUN 44*  CREATININE 1.96*  CALCIUM 9.1   LFT Recent Labs    03/11/19 1407  PROT 6.8  ALBUMIN 3.9  AST 12*  ALT 13  ALKPHOS 68  BILITOT 0.4   PT/INR No results for input(s): LABPROT, INR in the last 72 hours.  Studies/Results: Ct Head Wo Contrast  Result Date: 03/11/2019 CLINICAL DATA:  Focal neural deficit.  Possible stroke. EXAM: CT HEAD WITHOUT CONTRAST TECHNIQUE: Contiguous axial images were obtained from the base of the skull through the vertex without intravenous contrast. COMPARISON:  CT scan of April 19, 2018. FINDINGS: Brain: Ventriculostomy catheter tip is seen in right frontal horn which is not significantly changed compared to prior exam. No hydrocephalus is noted. No mass effect or midline shift is noted. No hemorrhage, acute infarction or mass lesion is noted. Small chronic ischemic white matter disease is noted. Vascular: No hyperdense vessel or unexpected calcification. Skull: Right posterior parietal ventriculostomy is noted. No acute abnormality is noted. Sinuses/Orbits: No acute finding. Other: None. IMPRESSION: Stable position of right-sided ventriculostomy catheter with tip in right frontal horn. No hydrocephalus is noted. Small chronic ischemic white matter disease. No acute intracranial abnormality seen. Electronically Signed   By: Lupita RaiderJames  Green Jr M.D.   On: 03/11/2019 16:00    Impression: Severe symptomatic anemia with history of black stools, coffee-ground emesis and small  amount of vomiting bright red blood. Hemoglobin 3.8 on presentation with elevated BUN/creatinine ratio 44/1.96 History of peptic ulcer disease and NSAID use, also on aspirin 81 mg a day To receive 2 unit PRBC transfusion  Plan: Clear liquid diet today, n.p.o. post midnight  for EGD in a.m.. Agree with IV Protonix drip as well as octreotide until etiology of upper GI bleeding is determined. Monitor H&H and transfuse to keep hemoglobin above 7. The risks and the benefits of the procedure were discussed with the patient in details, she verbalized understanding and consents.   LOS: 0 days   Ronnette Juniper, MD  03/11/2019, 4:54 PM  Pager [phone] If no answer or after 5 PM call [phone]

## 2019-03-12 ENCOUNTER — Inpatient Hospital Stay (HOSPITAL_COMMUNITY): Payer: Medicare Other | Admitting: Certified Registered"

## 2019-03-12 ENCOUNTER — Encounter (HOSPITAL_COMMUNITY)
Admission: EM | Disposition: A | Discharge status: Home / Self Care | Payer: Self-pay | Source: Home / Self Care | Attending: Internal Medicine

## 2019-03-12 ENCOUNTER — Encounter (HOSPITAL_COMMUNITY): Payer: Self-pay

## 2019-03-12 ENCOUNTER — Inpatient Hospital Stay (HOSPITAL_COMMUNITY): Payer: Medicare Other

## 2019-03-12 DIAGNOSIS — Z9889 Other specified postprocedural states: Secondary | ICD-10-CM

## 2019-03-12 DIAGNOSIS — F329 Major depressive disorder, single episode, unspecified: Secondary | ICD-10-CM

## 2019-03-12 DIAGNOSIS — D62 Acute posthemorrhagic anemia: Secondary | ICD-10-CM

## 2019-03-12 DIAGNOSIS — I10 Essential (primary) hypertension: Secondary | ICD-10-CM

## 2019-03-12 DIAGNOSIS — K254 Chronic or unspecified gastric ulcer with hemorrhage: Principal | ICD-10-CM

## 2019-03-12 DIAGNOSIS — K2270 Barrett's esophagus without dysplasia: Secondary | ICD-10-CM

## 2019-03-12 DIAGNOSIS — K259 Gastric ulcer, unspecified as acute or chronic, without hemorrhage or perforation: Secondary | ICD-10-CM

## 2019-03-12 DIAGNOSIS — Z79899 Other long term (current) drug therapy: Secondary | ICD-10-CM

## 2019-03-12 DIAGNOSIS — F419 Anxiety disorder, unspecified: Secondary | ICD-10-CM

## 2019-03-12 DIAGNOSIS — R7989 Other specified abnormal findings of blood chemistry: Secondary | ICD-10-CM

## 2019-03-12 DIAGNOSIS — R200 Anesthesia of skin: Secondary | ICD-10-CM

## 2019-03-12 HISTORY — PX: ESOPHAGOGASTRODUODENOSCOPY (EGD) WITH PROPOFOL: SHX5813

## 2019-03-12 HISTORY — PX: BIOPSY: SHX5522

## 2019-03-12 HISTORY — PX: BALLOON DILATION: SHX5330

## 2019-03-12 HISTORY — DX: Gastric ulcer, unspecified as acute or chronic, without hemorrhage or perforation: K25.9

## 2019-03-12 LAB — TYPE AND SCREEN
ABO/RH(D): A POS
Antibody Screen: NEGATIVE
Unit division: 0
Unit division: 0

## 2019-03-12 LAB — CBC
HCT: 26.5 % — ABNORMAL LOW (ref 36.0–46.0)
Hemoglobin: 8.9 g/dL — ABNORMAL LOW (ref 12.0–15.0)
MCH: 30.6 pg (ref 26.0–34.0)
MCHC: 33.6 g/dL (ref 30.0–36.0)
MCV: 91.1 fL (ref 80.0–100.0)
Platelets: 189 10*3/uL (ref 150–400)
RBC: 2.91 MIL/uL — ABNORMAL LOW (ref 3.87–5.11)
RDW: 14.6 % (ref 11.5–15.5)
WBC: 8 10*3/uL (ref 4.0–10.5)
nRBC: 1.6 % — ABNORMAL HIGH (ref 0.0–0.2)

## 2019-03-12 LAB — COMPREHENSIVE METABOLIC PANEL
ALT: 18 U/L (ref 0–44)
AST: 44 U/L — ABNORMAL HIGH (ref 15–41)
Albumin: 3.9 g/dL (ref 3.5–5.0)
Alkaline Phosphatase: 69 U/L (ref 38–126)
Anion gap: 12 (ref 5–15)
BUN: 41 mg/dL — ABNORMAL HIGH (ref 8–23)
CO2: 15 mmol/L — ABNORMAL LOW (ref 22–32)
Calcium: 9.2 mg/dL (ref 8.9–10.3)
Chloride: 113 mmol/L — ABNORMAL HIGH (ref 98–111)
Creatinine, Ser: 1.92 mg/dL — ABNORMAL HIGH (ref 0.44–1.00)
GFR calc Af Amer: 30 mL/min — ABNORMAL LOW (ref >60)
GFR calc non Af Amer: 26 mL/min — ABNORMAL LOW (ref >60)
Glucose, Bld: 125 mg/dL — ABNORMAL HIGH (ref 70–99)
Potassium: 5.3 mmol/L — ABNORMAL HIGH (ref 3.5–5.1)
Sodium: 140 mmol/L (ref 135–145)
Total Bilirubin: 2.1 mg/dL — ABNORMAL HIGH (ref 0.3–1.2)
Total Protein: 6.5 g/dL (ref 6.5–8.1)

## 2019-03-12 LAB — BPAM RBC
Blood Product Expiration Date: 202010202359
Blood Product Expiration Date: 202010202359
ISSUE DATE / TIME: 202009261611
ISSUE DATE / TIME: 202009262006
Unit Type and Rh: 6200
Unit Type and Rh: 6200

## 2019-03-12 LAB — HEMOGLOBIN AND HEMATOCRIT, BLOOD
HCT: 23.5 % — ABNORMAL LOW (ref 36.0–46.0)
Hemoglobin: 7.9 g/dL — ABNORMAL LOW (ref 12.0–15.0)

## 2019-03-12 SURGERY — ESOPHAGOGASTRODUODENOSCOPY (EGD) WITH PROPOFOL
Anesthesia: Monitor Anesthesia Care

## 2019-03-12 MED ORDER — HYDROMORPHONE HCL 1 MG/ML IJ SOLN
0.5000 mg | INTRAMUSCULAR | Status: AC | PRN
  Administered 2019-03-12 (×2): 0.5 mg via INTRAVENOUS
  Filled 2019-03-12: qty 1
  Filled 2019-03-12: qty 0.5

## 2019-03-12 MED ORDER — LABETALOL HCL 5 MG/ML IV SOLN
10.0000 mg | INTRAVENOUS | Status: DC | PRN
  Administered 2019-03-12: 10 mg via INTRAVENOUS
  Filled 2019-03-12: qty 4

## 2019-03-12 MED ORDER — NEBIVOLOL HCL 10 MG PO TABS
10.0000 mg | ORAL_TABLET | Freq: Every morning | ORAL | Status: DC
  Administered 2019-03-12 – 2019-03-14 (×3): 10 mg via ORAL
  Filled 2019-03-12 (×3): qty 1

## 2019-03-12 MED ORDER — LORAZEPAM 1 MG PO TABS
1.0000 mg | ORAL_TABLET | Freq: Every day | ORAL | Status: DC
  Administered 2019-03-12 – 2019-03-13 (×2): 1 mg via ORAL
  Filled 2019-03-12 (×2): qty 1

## 2019-03-12 MED ORDER — PROPOFOL 500 MG/50ML IV EMUL
INTRAVENOUS | Status: DC | PRN
  Administered 2019-03-12: 75 ug/kg/min via INTRAVENOUS

## 2019-03-12 MED ORDER — SODIUM CHLORIDE 0.9 % IV SOLN: INTRAVENOUS | Status: DC

## 2019-03-12 MED ORDER — LIDOCAINE 2% (20 MG/ML) 5 ML SYRINGE
INTRAMUSCULAR | Status: DC | PRN
  Administered 2019-03-12: 60 mg via INTRAVENOUS

## 2019-03-12 MED ORDER — PANTOPRAZOLE SODIUM 40 MG IV SOLR
40.0000 mg | Freq: Two times a day (BID) | INTRAVENOUS | Status: DC
  Administered 2019-03-12 – 2019-03-13 (×2): 40 mg via INTRAVENOUS
  Filled 2019-03-12 (×2): qty 40

## 2019-03-12 MED ORDER — GABAPENTIN 100 MG PO CAPS
100.0000 mg | ORAL_CAPSULE | Freq: Every day | ORAL | Status: DC
  Administered 2019-03-12 – 2019-03-13 (×2): 100 mg via ORAL
  Filled 2019-03-12 (×2): qty 1

## 2019-03-12 MED ORDER — PROPOFOL 500 MG/50ML IV EMUL
INTRAVENOUS | Status: DC | PRN
  Administered 2019-03-12 (×2): 10 ug via INTRAVENOUS

## 2019-03-12 MED ORDER — HYDROMORPHONE HCL 1 MG/ML IJ SOLN
0.2500 mg | INTRAMUSCULAR | Status: AC | PRN
  Administered 2019-03-12 – 2019-03-13 (×3): 0.25 mg via INTRAVENOUS
  Filled 2019-03-12 (×3): qty 0.5

## 2019-03-12 SURGICAL SUPPLY — 15 items

## 2019-03-12 NOTE — Transfer of Care (Signed)
Immediate Anesthesia Transfer of Care Note  Patient: Kristen Campbell  Procedure(s) Performed: ESOPHAGOGASTRODUODENOSCOPY (EGD) WITH PROPOFOL (N/A ) BIOPSY BALLOON DILATION (N/A )  Patient Location: PACU  Anesthesia Type:MAC  Level of Consciousness: awake, alert , oriented and patient cooperative  Airway & Oxygen Therapy: Patient Spontanous Breathing  Post-op Assessment: Report given to RN and Post -op Vital signs reviewed and stable  Post vital signs: Reviewed and stable  Last Vitals:  Vitals Value Taken Time  BP    Temp    Pulse 81 03/12/19 1054  Resp 15 03/12/19 1054  SpO2 99 % 03/12/19 1054  Vitals shown include unvalidated device data.  Last Pain:  Vitals:   03/12/19 1019  TempSrc: Oral  PainSc: 0-No pain      Patients Stated Pain Goal: 3 (62/22/97 9892)  Complications: No apparent anesthesia complications

## 2019-03-12 NOTE — Op Note (Signed)
[facility] Patient Name: Kristen Campbell Procedure Date : 03/12/2019 MRN: [id number] Attending MD: Kerin Salen , MD Date of Birth: 11-21-1945 CSN: [id number] Age: 73 Admit Type: Inpatient Procedure:                Upper GI endoscopy Indications:              Iron deficiency anemia secondary to chronic blood                            loss, Coffee-ground emesis, Hematemesis, Melena Providers:                Kerin Salen, MD, Clearnce Sorrel, RN, Lawson Radar,                            Technician, Elease Hashimoto "Trish" Earna Coder, CRNA Referring MD:              Medicines:                Monitored Anesthesia Care Complications:            No immediate complications. Estimated blood loss:                            Minimal. Estimated Blood Loss:     Estimated blood loss was minimal. Procedure:                Pre-Anesthesia Assessment:                           - Prior to the procedure, a History and Physical                            was performed, and patient medications and                            allergies were reviewed. The patient's tolerance of                            previous anesthesia was also reviewed. The risks                            and benefits of the procedure and the sedation                            options and risks were discussed with the patient.                            All questions were answered, and informed consent                            was obtained. Prior Anticoagulants: The patient has                            taken no previous anticoagulant or antiplatelet  agents. ASA Grade Assessment: III - A patient with                            severe systemic disease. After reviewing the risks                            and benefits, the patient was deemed in                            satisfactory condition to undergo the procedure.                           After obtaining informed consent, the endoscope was                   passed under direct vision. Throughout the                            procedure, the patient's blood pressure, pulse, and                            oxygen saturations were monitored continuously. The                            GIF-H190 ([id number]) Olympus gastroscope was                            introduced through the mouth, and advanced to the                            second part of duodenum. The upper GI endoscopy was                            accomplished without difficulty. The patient                            tolerated the procedure well. The upper GI                            endoscopy was accomplished without difficulty. The                            patient tolerated the procedure well. Scope In: Scope Out: Findings:      The upper third of the esophagus and middle third of the esophagus were       normal.      There were esophageal mucosal changes suspicious for short-segment       Barrett's esophagus present in the lower third of the esophagus. The       maximum longitudinal extent of these mucosal changes was 1 cm in length.       Mucosa was biopsied with a cold forceps for histology in a targeted       manner from 31 to 32 cm from the incisors. One specimen bottle was sent       to pathology.      A 3 cm  hiatal hernia was present.      A deformity was found at the pylorus.      One non-bleeding cratered gastric ulcer with a clean ulcer base (Forrest       Class III) was found at the pylorus. The lesion was 6 mm in largest       dimension.      A benign-appearing, intrinsic moderate stenosis was found at the       pylorus. This was traversed. A TTS dilator was passed through the scope.       Dilation with a 12-13.5-15 mm x 8 cm CRE balloon dilator was performed       to 12 mm. The dilation site was examined following endoscope reinsertion       and showed moderate improvement in luminal narrowing.      Biopsies were taken with a cold forceps in the  gastric antrum for       Helicobacter pylori testing.      The cardia and gastric fundus were normal on retroflexion.      The duodenal bulb, first portion of the duodenum and second portion of       the duodenum were normal. Impression:               - Normal upper third of esophagus and middle third                            of esophagus.                           - Esophageal mucosal changes suspicious for                            short-segment Barrett's esophagus. Biopsied.                           - 3 cm hiatal hernia.                           - Deformity in the pylorus.                           - Non-bleeding gastric ulcer with a clean ulcer                            base (Forrest Class III).                           - Gastric stenosis was found at the pylorus.                            Dilated.                           - Normal duodenal bulb, first portion of the                            duodenum and second portion of the duodenum.                           -  Biopsies were taken with a cold forceps for                            Helicobacter pylori testing. Moderate Sedation:      Patient did not receive moderate sedation for this procedure, but       instead received monitored anesthesia care. Recommendation:           - Mechanical soft diet.                           - Continue present medications.                           - Await pathology results. Procedure Code(s):        --- Professional ---                           [phone], Esophagogastroduodenoscopy, flexible,                            transoral; with dilation of gastric/duodenal                            stricture(s) (eg, balloon, bougie)                           43239, 55, Esophagogastroduodenoscopy, flexible,                            transoral; with biopsy, single or multiple Diagnosis Code(s):        --- Professional ---                           K22.8, Other specified diseases of esophagus                            K44.9, Diaphragmatic hernia without obstruction or                            gangrene                           K31.89, Other diseases of stomach and duodenum                           K25.9, Gastric ulcer, unspecified as acute or                            chronic, without hemorrhage or perforation                           K31.1, Adult hypertrophic pyloric stenosis                           D50.0, Iron deficiency anemia secondary to blood  loss (chronic)                           K92.0, Hematemesis                           K92.1, Melena (includes Hematochezia) CPT copyright 2019 [employer]. All rights reserved. The codes documented in this report are preliminary and upon coder review may  be revised to meet current compliance requirements. Kerin Salen, MD 03/12/2019 10:58:11 AM This report has been signed electronically. Number of Addenda: 0

## 2019-03-12 NOTE — Progress Notes (Signed)
   Subjective: Pt states that she feels improved after receiving 2 units pRBCs. No further melena or coffee-ground emesis noted at time of exam.  Objective:  Vital signs in last 24 hours: Vitals:   03/12/19 0700 03/12/19 0927 03/12/19 0928 03/12/19 1019  BP: (!) 182/71  (!) 166/62 (!) 186/70  Pulse: 81  78 82  Resp: 18  17 18   Temp:  98.6 F (37 C)  98.7 F (37.1 C)  TempSrc:  Oral  Oral  SpO2: 98%  98% 97%  Weight:      Height:       Physical Exam: General: NAD, alert and oriented x4, afebrile HEENT: No conjunctival pallor present Cardio: RRR, no m/r/g Abdominal: Soft. No distension. Abdominal tenderness in right upper quadrant to moderate palpation. No rebound or guarding. Pulmonary: CTAB, no r/r/w Psych: Affect appropriate, engages well.  Assessment/Plan:  Active Problems:   Upper GI bleed  Patient is a 73yo female with PMH of hypertension, gastric ulcers, anemia who presented with signs and symptoms consistent with an upper GI bleed (dyspnea, melena and coffee ground emesis).  Per chart review, patient presented with a hemoglobin of 6.7 in 2012 and underwent a endoscopic evaluation which revealed a pyloric channel ulcer. Colonoscopy in January 2012 was unrevealing. Source of current GI bleed unidentified.  # Symptomatic anemia  Upper GI Bleed: Hgb 8.8 from 3.8. Bleeding source appears likely gastric ulcer versus duodenal ulcer. EGD 09/27 AM. - s/p 2 units pRBCs, will transfuse additional unit if HgB<7 - Continue Octreotide 100 mcg load with 50 mcg/h infusion - Continue IV pantoprazole 8mg /hour - Continue folic acid 1mg  daily, thiamine 100mg  daily, multivitamin - CBC daily - H/H once 2PM 09/27, transfuse if Hgb <7.0  # Elevated creatinine: Creatinine stable at 1.92 from 1.96 likely prerenal AKI 2/2 volume depletion. - CMP daily  # HTN: BP remains elevated at 186/35mmHg.  - Start hydralazine 5mg  injection [id number] PRN for systolic GU>542HCWC or diastolic >376EGBT -  Start labetalol 10mg  injection D1VOH PRN for systolic YW>737TGGY or diastolic >694WNIO  # Depression with anxiety: Stable. Depression contributed to by passing of husband in 08/19.  - Continue home duloxetine 60 mg daily  - Continue home hydroxyzine 25 mg nightly.  # Right-sided numbness: Stable. Will consider further work-up with MRI if VP shunt device is compatible following resolution of upper GI bleed.   - Continue home medication atorvastatin 10mg  daily; consider increase to high-dose following acute management of upper GI bleed  Dispo: Admit patient to Inpatient with expected length of stay greater than 2 midnights.  Diet: Heart healthy DVT prophylaxis: SCDs Dispo: Anticipated discharge in approximately 2-3 day(s).   LOS: 1 day   Flonnie Hailstone, Medical Student 03/12/2019, 10:51 AM

## 2019-03-12 NOTE — Evaluation (Signed)
Physical Therapy Evaluation Patient Details Name: Kristen Campbell MRN: [id number] DOB: 1945/09/08 Today's Date: 03/12/2019   History of Present Illness  Patient is a 73yo female with PMH of hypertension, gastric ulcers, anemia who presented with signs and symptoms consistent with an upper GI bleed (dyspnea, melena and coffee ground emesis)  Clinical Impression  Pt admitted with above diagnosis. Pt was able to ambulate with RW with min guard assist and cues needing cues for walker safety. Pt needed min assist if not using RW. VSS with sats >90% throughout on RA but did have DOE 3/4.  Should progress well and go home with family with 24 hour care.  Pt currently with functional limitations due to the deficits listed below (see PT Problem List). Pt will benefit from skilled PT to increase their independence and safety with mobility to allow discharge to the venue listed below.      Follow Up Recommendations Home health PT;Supervision/Assistance - 24 hour    Equipment Recommendations  None recommended by PT    Recommendations for Other Services       Precautions / Restrictions Precautions Precautions: Fall Restrictions Weight Bearing Restrictions: No      Mobility  Bed Mobility Overal bed mobility: Independent             General bed mobility comments: took incr time but pt able to come to EOB wihtout assist  Transfers Overall transfer level: Needs assistance Equipment used: Rolling walker (2 wheeled) Transfers: Sit to/from Stand Sit to Stand: Min guard         General transfer comment: steadying assist upon initialy stand  Ambulation/Gait Ambulation/Gait assistance: Min guard Gait Distance (Feet): 280 Feet Assistive device: Rolling walker (2 wheeled) Gait Pattern/deviations: Step-through pattern;Decreased stride length;Shuffle;Drifts right/left   Gait velocity interpretation: <1.31 ft/sec, indicative of household ambulator General Gait Details: Pt needs cues to use  RW as she tends to either walk too close to it or too far behind it.  Also needed cues to incr step length as she shuffles at times. Pt needs min assist without RW and definitely needs to use RW at all times.  Pt with DOE 3/4 during walk with VSS and sats >94% throughout.  Fatigued but able to complete a good walk.   Stairs            Wheelchair Mobility    Modified Rankin (Stroke Patients Only)       Balance Overall balance assessment: Needs assistance Sitting-balance support: No upper extremity supported;Feet supported Sitting balance-Leahy Scale: Fair     Standing balance support: Bilateral upper extremity supported;During functional activity Standing balance-Leahy Scale: Poor Standing balance comment: relies on RW for support                             Pertinent Vitals/Pain Pain Assessment: No/denies pain    Home Living Family/patient expects to be discharged to:: Private residence Living Arrangements: Children Available Help at Discharge: Family Type of Home: House Home Access: Stairs to enter Entrance Stairs-Rails: (family helps her) Technical brewer of Steps: 3 Home Layout: Two level;Able to live on main level with bedroom/bathroom Home Equipment: Kasandra Knudsen - single point;Bedside commode;Grab bars - tub/shower;Shower seat - built in;Hand held Tourist information centre manager - 4 wheels      Prior Function Level of Independence: Independent         Comments: Indpendent with bADLs. Family helps with IADLs. Pt likes to take care and play with her  grandbabies     Hand Dominance   Dominant Hand: Right    Extremity/Trunk Assessment   Upper Extremity Assessment Upper Extremity Assessment: Defer to OT evaluation    Lower Extremity Assessment Lower Extremity Assessment: Generalized weakness    Cervical / Trunk Assessment Cervical / Trunk Assessment: Kyphotic  Communication   Communication: No difficulties  Cognition Arousal/Alertness:  Awake/alert Behavior During Therapy: WFL for tasks assessed/performed Overall Cognitive Status: Within Functional Limits for tasks assessed                                        General Comments      Exercises     Assessment/Plan    PT Assessment Patient needs continued PT services  PT Problem List Decreased activity tolerance;Decreased balance;Decreased mobility;Decreased knowledge of use of DME;Decreased safety awareness;Decreased knowledge of precautions;Cardiopulmonary status limiting activity       PT Treatment Interventions DME instruction;Gait training;Functional mobility training;Therapeutic activities;Therapeutic exercise;Balance training;Patient/family education;Stair training    PT Goals (Current goals can be found in the Care Plan section)  Acute Rehab PT Goals Patient Stated Goal: to go home PT Goal Formulation: With patient Time For Goal Achievement: 03/26/19 Potential to Achieve Goals: Good    Frequency Min 3X/week   Barriers to discharge        Co-evaluation PT/OT/SLP Co-Evaluation/Treatment: Yes Reason for Co-Treatment: For patient/therapist safety PT goals addressed during session: Mobility/safety with mobility         AM-PAC PT "6 Clicks" Mobility  Outcome Measure Help needed turning from your back to your side while in a flat bed without using bedrails?: None Help needed moving from lying on your back to sitting on the side of a flat bed without using bedrails?: None Help needed moving to and from a bed to a chair (including a wheelchair)?: A Little Help needed standing up from a chair using your arms (e.g., wheelchair or bedside chair)?: A Little Help needed to walk in hospital room?: A Little Help needed climbing 3-5 steps with a railing? : A Little 6 Click Score: 20    End of Session Equipment Utilized During Treatment: Gait belt Activity Tolerance: Patient limited by fatigue Patient left: in chair;with call bell/phone  within reach;with chair alarm set Nurse Communication: Mobility status PT Visit Diagnosis: Muscle weakness (generalized) (M62.81);Unsteadiness on feet (R26.81)    Time: 9509-3267 PT Time Calculation (min) (ACUTE ONLY): 17 min   Charges:   PT Evaluation $PT Eval Moderate Complexity: 1 Mod          Kraig Genis,PT Acute Rehabilitation Services Pager:  [phone]  Office:  [phone]    Berline Lopes 03/12/2019, 2:22 PM

## 2019-03-12 NOTE — Progress Notes (Signed)
PT Cancellation Note  Patient Details Name: Kristen Campbell MRN: [id number] DOB: December 02, 1945   Cancelled Treatment:    Reason Eval/Treat Not Completed: Patient at procedure or test/unavailable(Pt in Endo.  will return as able.  )   Denice Paradise 03/12/2019, 10:26 AM Carys Malina,PT Acute Rehabilitation Services Pager:  [phone]  Office:  [phone]

## 2019-03-12 NOTE — ED Notes (Signed)
ED TO INPATIENT HANDOFF REPORT  ED Nurse Name and Phone #:  [phone]  S Name/Age/Gender Kristen Campbell 73 y.o. female Room/Bed: 023C/023C  Code Status   Code Status: Full Code  Home/SNF/Other Home Patient oriented to: self, place, time and situation Is this baseline? Yes   Triage Complete: Triage complete  Chief Complaint abd pain/nausea  Triage Note Pt presents with Right side abd pain, N/V and dark stool x1 week. Hx of ulcers. Pt also c/o Right leg numbness from her hip to her toes x2 days. No neuro deficits noted    Allergies Allergies  Allergen Reactions  . Carbamazepine Other (See Comments)    Hallucinations/ reaction to Tegretol   . Potassium Chloride Itching    Reaction to KlorCon  . Ibuprofen Itching  . Codeine Nausea And Vomiting  . Dilantin [Phenytoin] Other (See Comments)    MD said never to take it - unknown reaction    Level of Care/Admitting Diagnosis ED Disposition    ED Disposition Condition Comment   Admit  [facility] [[id number]]  Level of Care: Progressive [102]  Covid Evaluation: Asymptomatic Screening Protocol (No Symptoms)  Diagnosis: Upper GI bleed [267195]  Admitting Physician: Anne Shutter [[id number]]  Attending Physician: Anne Shutter [[id number]]  Estimated length of stay: past midnight tomorrow  Certification:: I certify this patient will need inpatient services for at least 2 midnights  PT Class (Do Not Modify): Inpatient [101]  PT Acc Code (Do Not Modify): Private [1]       B Medical/Surgery History Past Medical History:  Diagnosis Date  . Brain aneurysm   . Depression   . Hypertension    Past Surgical History:  Procedure Laterality Date  . ABDOMINAL SURGERY     fluid tumor removal  . ABDOMINAL SURGERY     bleeding ulcers  . CEREBRAL ANEURYSM REPAIR    . CSF SHUNT    . ROTATOR CUFF REPAIR       A IV Location/Drains/Wounds Patient Lines/Drains/Airways Status   Active  Line/Drains/Airways    Name:   Placement date:   Placement time:   Site:   Days:   Peripheral IV 03/11/19 Left Antecubital   03/11/19    1532    Antecubital   1   Peripheral IV 03/11/19 Right Forearm   03/11/19    1533    Forearm   1   Peripheral IV 03/11/19 Left Hand   03/11/19    1804    Hand   1          Intake/Output Last 24 hours  Intake/Output Summary (Last 24 hours) at 03/12/2019 0659 Last data filed at 03/11/2019 2318 Gross per 24 hour  Intake 1180 ml  Output -  Net 1180 ml    Labs/Imaging Results for orders placed or performed during the hospital encounter of 03/11/19 (from the past 48 hour(s))  Comprehensive metabolic panel     Status: Abnormal   Collection Time: 03/11/19  2:07 PM  Result Value Ref Range   Sodium 138 135 - 145 mmol/L   Potassium 4.5 3.5 - 5.1 mmol/L   Chloride 111 98 - 111 mmol/L   CO2 18 (L) 22 - 32 mmol/L   Glucose, Bld 115 (H) 70 - 99 mg/dL   BUN 44 (H) 8 - 23 mg/dL   Creatinine, Ser 5.78 (H) 0.44 - 1.00 mg/dL   Calcium 9.1 8.9 - 46.9 mg/dL   Total Protein 6.8 6.5 - 8.1 g/dL  Albumin 3.9 3.5 - 5.0 g/dL   AST 12 (L) 15 - 41 U/L   ALT 13 0 - 44 U/L   Alkaline Phosphatase 68 38 - 126 U/L   Total Bilirubin 0.4 0.3 - 1.2 mg/dL   GFR calc non Af Amer 25 (L) >60 mL/min   GFR calc Af Amer 29 (L) >60 mL/min   Anion gap 9 5 - 15    Comment: Performed at [facility], [address] [place], [place] [address]  CBC     Status: Abnormal   Collection Time: 03/11/19  2:07 PM  Result Value Ref Range   WBC 7.0 4.0 - 10.5 K/uL   RBC 1.47 (L) 3.87 - 5.11 MIL/uL   Hemoglobin 3.8 (LL) 12.0 - 15.0 g/dL    Comment: REPEATED TO VERIFY THIS CRITICAL RESULT HAS VERIFIED AND BEEN CALLED TO ASHLEY GORTON RN. BY TAMEECO CALDWELL ON 09 26 2020 AT 1454, AND HAS BEEN READ BACK.     HCT 13.2 (L) 36.0 - 46.0 %   MCV 89.8 80.0 - 100.0 fL   MCH 25.9 (L) 26.0 - 34.0 pg   MCHC 28.8 (L) 30.0 - 36.0 g/dL   RDW 60.415.7 (H) 54.011.5 - 98.115.5 %   Platelets 259 150 - 400 K/uL    nRBC 0.4 (H) 0.0 - 0.2 %    Comment: Performed at [facility], [address] [place], [place] [address]  Type and screen [facility]     Status: None (Preliminary result)   Collection Time: 03/11/19  2:07 PM  Result Value Ref Range   ABO/RH(D) A POS    Antibody Screen NEG    Sample Expiration 03/14/2019,2359    Unit Number [id number]    Blood Component Type RBC LR PHER1    Unit division 00    Status of Unit ISSUED    Transfusion Status OK TO TRANSFUSE    Crossmatch Result      Compatible Performed at [facility], [address] [place], [place] [address]    Unit Number [id number]    Blood Component Type RED CELLS,LR    Unit division 00    Status of Unit ISSUED    Transfusion Status OK TO TRANSFUSE    Crossmatch Result Compatible   ABO/Rh     Status: None   Collection Time: 03/11/19  2:07 PM  Result Value Ref Range   ABO/RH(D)      A POS Performed at [facility], [address] [place], [place] [address]   Prepare RBC     Status: None   Collection Time: 03/11/19  3:20 PM  Result Value Ref Range   Order Confirmation      ORDER PROCESSED BY BLOOD BANK Performed at [facility], [address] [place], [place] [address]   SARS Coronavirus 2 [facility](Hospital order, Performed in [facility] hospital lab) Nasopharyngeal Nasopharyngeal Swab     Status: None   Collection Time: 03/11/19  3:54 PM   Specimen: Nasopharyngeal Swab  Result Value Ref Range   SARS Coronavirus 2 NEGATIVE NEGATIVE    Comment: (NOTE) If result is NEGATIVE SARS-CoV-2 target nucleic acids are NOT DETECTED. The SARS-CoV-2 RNA is generally detectable in upper and lower  respiratory specimens during the acute phase of infection. The lowest  concentration of SARS-CoV-2 viral copies this assay can detect is 250  copies / mL. A negative result does not preclude SARS-CoV-2 infection  and  should not be used as the sole basis for treatment or other  patient  management decisions.  A negative result may occur with  improper specimen collection / handling, submission of specimen other  than nasopharyngeal swab, presence of viral mutation(s) within the  areas targeted by this assay, and inadequate number of viral copies  (<250 copies / mL). A negative result must be combined with clinical  observations, patient history, and epidemiological information. If result is POSITIVE SARS-CoV-2 target nucleic acids are DETECTED. The SARS-CoV-2 RNA is generally detectable in upper and lower  respiratory specimens dur ing the acute phase of infection.  Positive  results are indicative of active infection with SARS-CoV-2.  Clinical  correlation with patient history and other diagnostic information is  necessary to determine patient infection status.  Positive results do  not rule out bacterial infection or co-infection with other viruses. If result is PRESUMPTIVE POSTIVE SARS-CoV-2 nucleic acids MAY BE PRESENT.   A presumptive positive result was obtained on the submitted specimen  and confirmed on repeat testing.  While 2019 novel coronavirus  (SARS-CoV-2) nucleic acids may be present in the submitted sample  additional confirmatory testing may be necessary for epidemiological  and / or clinical management purposes  to differentiate between  SARS-CoV-2 and other Sarbecovirus currently known to infect humans.  If clinically indicated additional testing with an alternate test  methodology [phone]) is advised. The SARS-CoV-2 RNA is generally  detectable in upper and lower respiratory sp ecimens during the acute  phase of infection. The expected result is Negative. Fact Sheet for Patients:  StrictlyIdeas.no Fact Sheet for Healthcare Providers: BankingDealers.co.za This test is not yet approved or cleared by the [place] FDA and has been authorized for detection and/or diagnosis of SARS-CoV-2 by FDA under  an Emergency Use Authorization (EUA).  This EUA will remain in effect (meaning this test can be used) for the duration of the COVID-19 declaration under Section 564(b)(1) of the Act, 21 U.S.C. section 360bbb-3(b)(1), unless the authorization is terminated or revoked sooner. Performed at [facility], [place] [address], [place], [place] [address]    Dg Abd 1 View  Result Date: 03/12/2019 CLINICAL DATA:  Right-sided abdominal pain. Concern for free air. EXAM: ABDOMEN - 1 VIEW COMPARISON:  None. FINDINGS: No evidence of free air on single supine view. No bowel dilatation to suggest obstruction. Small volume of colonic stool. Ventriculoperitoneal shunt catheter tubing. One tube tip is in the right mid abdomen, 1 tube tip in the left lower quadrant. Bilateral gluteal calcifications IMPRESSION: 1. No evidence of free air on the supine view. 2. Nonobstructive bowel gas pattern. Electronically Signed   By: Keith Rake M.D.   On: 03/12/2019 03:57   Ct Head Wo Contrast  Result Date: 03/11/2019 CLINICAL DATA:  Focal neural deficit.  Possible stroke. EXAM: CT HEAD WITHOUT CONTRAST TECHNIQUE: Contiguous axial images were obtained from the base of the skull through the vertex without intravenous contrast. COMPARISON:  CT scan of April 19, 2018. FINDINGS: Brain: Ventriculostomy catheter tip is seen in right frontal horn which is not significantly changed compared to prior exam. No hydrocephalus is noted. No mass effect or midline shift is noted. No hemorrhage, acute infarction or mass lesion is noted. Small chronic ischemic white matter disease is noted. Vascular: No hyperdense vessel or unexpected calcification. Skull: Right posterior parietal ventriculostomy is noted. No acute abnormality is noted. Sinuses/Orbits: No acute finding. Other: None. IMPRESSION: Stable position of right-sided ventriculostomy catheter with tip in  right frontal horn. No hydrocephalus is noted. Small chronic ischemic white matter  disease. No acute intracranial abnormality seen. Electronically Signed   By: Lupita Raider M.D.   On: 03/11/2019 16:00   Dg Chest Port 1 View  Result Date: 03/12/2019 CLINICAL DATA:  Abdominal pain. Clinical concern for free air. EXAM: PORTABLE CHEST 1 VIEW COMPARISON:  09/08/2014 FINDINGS: The cardiomediastinal contours are normal. Upper normal heart size. Ventriculoperitoneal shunt catheter tubing projects over the right chest, unchanged from prior. Pulmonary vasculature is normal. No consolidation, pleural effusion, or pneumothorax. No acute osseous abnormalities are seen. No evidence of free air in the upper abdomen. IMPRESSION: 1. No acute chest findings. No evidence of free air in the upper abdomen. 2. Ventriculoperitoneal shunt tubing projects over the right chest. Electronically Signed   By: Narda Rutherford M.D.   On: 03/12/2019 03:56    Pending Labs Unresulted Labs (From admission, onward)    Start     Ordered   03/12/19 0500  CBC  Tomorrow morning,   R     03/11/19 1655   03/12/19 0500  Comprehensive metabolic panel  Tomorrow morning,   R     03/11/19 1655          Vitals/Pain Today's Vitals   03/12/19 0400 03/12/19 0500 03/12/19 0520 03/12/19 0615  BP: (!) 195/84 (!) 206/75  (!) 209/80  Pulse: 79 74  82  Resp: (!) Temp:      TempSrc:      SpO2: 97% 98%  98%  Weight:      Height:      PainSc:   Asleep     Isolation Precautions No active isolations  Medications Medications  octreotide (SANDOSTATIN) 2 mcg/mL load via infusion 100 mcg (100 mcg Intravenous Bolus from Bag 03/11/19 1853)    And  octreotide (SANDOSTATIN) 500 mcg in sodium chloride 0.9 % 250 mL (2 mcg/mL) infusion (50 mcg/hr Intravenous New Bag/Given 03/12/19 0407)  pantoprazole (PROTONIX) 80 mg in sodium chloride 0.9 % 250 mL (0.32 mg/mL) infusion (8 mg/hr Intravenous New Bag/Given 03/12/19 0406)  0.9 %  sodium chloride infusion ( Intravenous Not Given 03/12/19 0520)  0.9 %  sodium chloride  infusion ( Intravenous New Bag/Given 03/11/19 2142)  folic acid (FOLVITE) tablet 1 mg (1 mg Oral Not Given 03/11/19 1924)  thiamine (VITAMIN B-1) tablet 100 mg (100 mg Oral Not Given 03/11/19 1924)  multivitamin with minerals tablet 1 tablet (1 tablet Oral Not Given 03/11/19 1924)  DULoxetine (CYMBALTA) DR capsule 60 mg (60 mg Oral Not Given 03/11/19 1924)  hydrALAZINE (APRESOLINE) injection 5 mg (5 mg Intravenous Given 03/12/19 0630)  HYDROmorphone (DILAUDID) injection 0.5 mg (0.5 mg Intravenous Given 03/12/19 0248)  labetalol (NORMODYNE) injection 10 mg (has no administration in time range)  0.9 %  sodium chloride infusion (Manually program via Guardrails IV Fluids) ( Intravenous Stopped 03/11/19 2318)  pantoprazole (PROTONIX) 80 mg in sodium chloride 0.9 % 100 mL IVPB (0 mg Intravenous Stopped 03/11/19 1729)  famotidine (PEPCID) IVPB 20 mg premix (0 mg Intravenous Stopped 03/11/19 1911)  HYDROmorphone (DILAUDID) injection 0.25 mg (0.25 mg Intravenous Given 03/11/19 2145)  ondansetron (ZOFRAN) injection 4 mg (4 mg Intravenous Given 03/11/19 1925)  labetalol (NORMODYNE) injection 5 mg (5 mg Intravenous Given 03/11/19 2141)    Mobility walks with person assist     Focused Assessments Cardiac Assessment Handoff:  Cardiac Rhythm: Normal sinus rhythm No results found for: CKTOTAL, CKMB, CKMBINDEX, TROPONINI No results  found for: DDIMER Does the Patient currently have chest pain? No      R Recommendations: See Admitting Provider Note  Report given to:   Additional Notes:  Pt lives with daughter; attempted to call daughter but no answer. Has had previous blood transfusion for same issue. VERY unsteady on feet (we have been using bedside commode).

## 2019-03-12 NOTE — ED Notes (Signed)
Requested new bags of Octreotide and Pantoprazole from [place] with pharmacy.

## 2019-03-12 NOTE — Brief Op Note (Signed)
03/11/2019 - 03/12/2019  11:06 AM  PATIENT:  Kristen Campbell  73 y.o. female  PRE-OPERATIVE DIAGNOSIS:  melena,anemia  POST-OPERATIVE DIAGNOSIS:  Pyloric channel stricture- dilated, pyloric ulcer clean based, gastric biopy r/o H.pylori, esophageal biopsy rule out Barretts  PROCEDURE:  Procedure(s): ESOPHAGOGASTRODUODENOSCOPY (EGD) WITH PROPOFOL (N/A) BIOPSY BALLOON DILATION (N/A)  SURGEON:  Surgeon(s) and Role:    Ronnette Juniper, MD - Primary  PHYSICIAN ASSISTANT:   ASSISTANTS: Baird Cancer, RN, Laverda Sorenson, Tech  ANESTHESIA:   MAC  EBL:  Minimal  BLOOD ADMINISTERED:none  DRAINS: none   LOCAL MEDICATIONS USED:  NONE  SPECIMEN:  Biopsy / Limited Resection  DISPOSITION OF SPECIMEN:  PATHOLOGY  COUNTS:  YES  TOURNIQUET:  * No tourniquets in log *  DICTATION: .Dragon Dictation  PLAN OF CARE: Admit to inpatient   PATIENT DISPOSITION:  PACU - hemodynamically stable.   Delay start of Pharmacological VTE agent (>24hrs) due to surgical blood loss or risk of bleeding: no

## 2019-03-12 NOTE — Interval H&P Note (Signed)
History and Physical Interval Note: 73/female with severe anemia due to melena, coffee ground emesis and hematemesis, history of NSAID use and was on ASA 81 mg daily for an EGD today.  03/12/2019 10:27 AM  Kristen Campbell  has presented today for EGD, with the diagnosis of melena,anemia.  The various methods of treatment have been discussed with the patient and family. After consideration of risks, benefits and other options for treatment, the patient has consented to  Procedure(s): ESOPHAGOGASTRODUODENOSCOPY (EGD) WITH PROPOFOL (N/A) as a surgical intervention.  The patient's history has been reviewed, patient examined, no change in status, stable for surgery.  I have reviewed the patient's chart and labs.  Questions were answered to the patient's satisfaction.     Ronnette Juniper

## 2019-03-12 NOTE — ED Notes (Signed)
ED TO INPATIENT HANDOFF REPORT  ED Nurse Name and Phone #:   S Name/Age/Gender Kristen Campbell 73 y.o. female Room/Bed: 023C/023C  Code Status   Code Status: Full Code  Home/SNF/Other Home Patient oriented to: self, place, time and situation Is this baseline? Yes   Triage Complete: Triage complete  Chief Complaint abd pain/nausea  Triage Note Pt presents with Right side abd pain, N/V and dark stool x1 week. Hx of ulcers. Pt also c/o Right leg numbness from her hip to her toes x2 days. No neuro deficits noted   TC from ENDO , pt will has ENDO test this AM.   Allergies Allergies  Allergen Reactions  . Carbamazepine Other (See Comments)    Hallucinations/ reaction to Tegretol   . Potassium Chloride Itching    Reaction to KlorCon  . Ibuprofen Itching  . Codeine Nausea And Vomiting  . Dilantin [Phenytoin] Other (See Comments)    MD said never to take it - unknown reaction    Level of Care/Admitting Diagnosis ED Disposition    ED Disposition Condition Comment   Admit  [facility] [[id number]]  Level of Care: Progressive [102]  Covid Evaluation: Asymptomatic Screening Protocol (No Symptoms)  Diagnosis: Upper GI bleed [267195]  Admitting Physician: Anne ShutterAINES, ALEXANDER N [[id number]]  Attending Physician: Anne ShutterAINES, ALEXANDER N [[id number]]  Estimated length of stay: past midnight tomorrow  Certification:: I certify this patient will need inpatient services for at least 2 midnights  PT Class (Do Not Modify): Inpatient [101]  PT Acc Code (Do Not Modify): Private [1]       B Medical/Surgery History Past Medical History:  Diagnosis Date  . Brain aneurysm   . Depression   . Hypertension    Past Surgical History:  Procedure Laterality Date  . ABDOMINAL SURGERY     fluid tumor removal  . ABDOMINAL SURGERY     bleeding ulcers  . CEREBRAL ANEURYSM REPAIR    . CSF SHUNT    . ROTATOR CUFF REPAIR       A IV Location/Drains/Wounds Patient  Lines/Drains/Airways Status   Active Line/Drains/Airways    Name:   Placement date:   Placement time:   Site:   Days:   Peripheral IV 03/11/19 Left Antecubital   03/11/19    1532    Antecubital   1   Peripheral IV 03/11/19 Right Forearm   03/11/19    1533    Forearm   1   Peripheral IV 03/11/19 Left Hand   03/11/19    1804    Hand   1          Intake/Output Last 24 hours  Intake/Output Summary (Last 24 hours) at 03/12/2019 0749 Last data filed at 03/11/2019 2318 Gross per 24 hour  Intake 1180 ml  Output -  Net 1180 ml    Labs/Imaging Results for orders placed or performed during the hospital encounter of 03/11/19 (from the past 48 hour(s))  Comprehensive metabolic panel     Status: Abnormal   Collection Time: 03/11/19  2:07 PM  Result Value Ref Range   Sodium 138 135 - 145 mmol/L   Potassium 4.5 3.5 - 5.1 mmol/L   Chloride 111 98 - 111 mmol/L   CO2 18 (L) 22 - 32 mmol/L   Glucose, Bld 115 (H) 70 - 99 mg/dL   BUN 44 (H) 8 - 23 mg/dL   Creatinine, Ser 9.521.96 (H) 0.44 - 1.00 mg/dL   Calcium 9.1 8.9 - 10.3  mg/dL   Total Protein 6.8 6.5 - 8.1 g/dL   Albumin 3.9 3.5 - 5.0 g/dL   AST 12 (L) 15 - 41 U/L   ALT 13 0 - 44 U/L   Alkaline Phosphatase 68 38 - 126 U/L   Total Bilirubin 0.4 0.3 - 1.2 mg/dL   GFR calc non Af Amer 25 (L) >60 mL/min   GFR calc Af Amer 29 (L) >60 mL/min   Anion gap 9 5 - 15    Comment: Performed at [facility], [address], [place], [place] [address]  CBC     Status: Abnormal   Collection Time: 03/11/19  2:07 PM  Result Value Ref Range   WBC 7.0 4.0 - 10.5 K/uL   RBC 1.47 (L) 3.87 - 5.11 MIL/uL   Hemoglobin 3.8 (LL) 12.0 - 15.0 g/dL    Comment: REPEATED TO VERIFY THIS CRITICAL RESULT HAS VERIFIED AND BEEN CALLED TO ASHLEY GORTON RN. BY TAMEECO CALDWELL ON 09 26 2020 AT 1454, AND HAS BEEN READ BACK.     HCT 13.2 (L) 36.0 - 46.0 %   MCV 89.8 80.0 - 100.0 fL   MCH 25.9 (L) 26.0 - 34.0 pg   MCHC 28.8 (L) 30.0 - 36.0 g/dL   RDW 60.4 (H) 54.0 -  15.5 %   Platelets 259 150 - 400 K/uL   nRBC 0.4 (H) 0.0 - 0.2 %    Comment: Performed at [facility], [address] [place], [place] [address]  Type and screen [facility]     Status: None (Preliminary result)   Collection Time: 03/11/19  2:07 PM  Result Value Ref Range   ABO/RH(D) A POS    Antibody Screen NEG    Sample Expiration 03/14/2019,2359    Unit Number [id number]    Blood Component Type RBC LR PHER1    Unit division 00    Status of Unit ISSUED    Transfusion Status OK TO TRANSFUSE    Crossmatch Result      Compatible Performed at [facility], [address], [place], [place] [address]    Unit Number [id number]    Blood Component Type RED CELLS,LR    Unit division 00    Status of Unit ISSUED    Transfusion Status OK TO TRANSFUSE    Crossmatch Result Compatible   ABO/Rh     Status: None   Collection Time: 03/11/19  2:07 PM  Result Value Ref Range   ABO/RH(D)      A POS Performed at [facility], [address], [place], [place] [address]   Prepare RBC     Status: None   Collection Time: 03/11/19  3:20 PM  Result Value Ref Range   Order Confirmation      ORDER PROCESSED BY BLOOD BANK Performed at [facility], [address], [place], [place] [address]   SARS Coronavirus 2 [facility] order, Performed in [facility] hospital lab) Nasopharyngeal Nasopharyngeal Swab     Status: None   Collection Time: 03/11/19  3:54 PM   Specimen: Nasopharyngeal Swab  Result Value Ref Range   SARS Coronavirus 2 NEGATIVE NEGATIVE    Comment: (NOTE) If result is NEGATIVE SARS-CoV-2 target nucleic acids are NOT DETECTED. The SARS-CoV-2 RNA is generally detectable in upper and lower  respiratory specimens during the acute phase of infection. The lowest  concentration of SARS-CoV-2 viral copies this assay can detect is 250  copies /  mL. A negative result does not preclude SARS-CoV-2 infection  and should not be used as the sole basis  for treatment or other  patient management decisions.  A negative result may occur with  improper specimen collection / handling, submission of specimen other  than nasopharyngeal swab, presence of viral mutation(s) within the  areas targeted by this assay, and inadequate number of viral copies  (<250 copies / mL). A negative result must be combined with clinical  observations, patient history, and epidemiological information. If result is POSITIVE SARS-CoV-2 target nucleic acids are DETECTED. The SARS-CoV-2 RNA is generally detectable in upper and lower  respiratory specimens dur ing the acute phase of infection.  Positive  results are indicative of active infection with SARS-CoV-2.  Clinical  correlation with patient history and other diagnostic information is  necessary to determine patient infection status.  Positive results do  not rule out bacterial infection or co-infection with other viruses. If result is PRESUMPTIVE POSTIVE SARS-CoV-2 nucleic acids MAY BE PRESENT.   A presumptive positive result was obtained on the submitted specimen  and confirmed on repeat testing.  While 2019 novel coronavirus  (SARS-CoV-2) nucleic acids may be present in the submitted sample  additional confirmatory testing may be necessary for epidemiological  and / or clinical management purposes  to differentiate between  SARS-CoV-2 and other Sarbecovirus currently known to infect humans.  If clinically indicated additional testing with an alternate test  methodology [phone]) is advised. The SARS-CoV-2 RNA is generally  detectable in upper and lower respiratory sp ecimens during the acute  phase of infection. The expected result is Negative. Fact Sheet for Patients:  BoilerBrush.com.cy Fact Sheet for Healthcare Providers: https://pope.com/ This test is not yet approved or cleared by the [place] FDA and has been authorized for detection and/or  diagnosis of SARS-CoV-2 by FDA under an Emergency Use Authorization (EUA).  This EUA will remain in effect (meaning this test can be used) for the duration of the COVID-19 declaration under Section 564(b)(1) of the Act, 21 U.S.C. section 360bbb-3(b)(1), unless the authorization is terminated or revoked sooner. Performed at [facility], [address] [place], [place] [address]   CBC     Status: Abnormal   Collection Time: 03/12/19  6:40 AM  Result Value Ref Range   WBC 8.0 4.0 - 10.5 K/uL   RBC 2.91 (L) 3.87 - 5.11 MIL/uL   Hemoglobin 8.9 (L) 12.0 - 15.0 g/dL    Comment: REPEATED TO VERIFY POST TRANSFUSION SPECIMEN DELTA CHECK NOTED    HCT 26.5 (L) 36.0 - 46.0 %   MCV 91.1 80.0 - 100.0 fL   MCH 30.6 26.0 - 34.0 pg   MCHC 33.6 30.0 - 36.0 g/dL   RDW 95.6 21.3 - 08.6 %   Platelets 189 150 - 400 K/uL   nRBC 1.6 (H) 0.0 - 0.2 %    Comment: Performed at [facility], [address], [place], [place] [address]  Comprehensive metabolic panel     Status: Abnormal   Collection Time: 03/12/19  6:40 AM  Result Value Ref Range   Sodium 140 135 - 145 mmol/L   Potassium 5.3 (H) 3.5 - 5.1 mmol/L    Comment: HEMOLYSIS AT THIS LEVEL MAY AFFECT RESULT   Chloride 113 (H) 98 - 111 mmol/L   CO2 15 (L) 22 - 32 mmol/L   Glucose, Bld 125 (H) 70 - 99 mg/dL   BUN 41 (H) 8 - 23 mg/dL  Creatinine, Ser 1.92 (H) 0.44 - 1.00 mg/dL   Calcium 9.2 8.9 - 21.1 mg/dL   Total Protein 6.5 6.5 - 8.1 g/dL   Albumin 3.9 3.5 - 5.0 g/dL   AST 44 (H) 15 - 41 U/L   ALT 18 0 - 44 U/L   Alkaline Phosphatase 69 38 - 126 U/L   Total Bilirubin 2.1 (H) 0.3 - 1.2 mg/dL   GFR calc non Af Amer 26 (L) >60 mL/min   GFR calc Af Amer 30 (L) >60 mL/min   Anion gap 12 5 - 15    Comment: Performed at [facility], [address] [place], [place] [address]   Dg Abd 1 View  Result Date: 03/12/2019 CLINICAL DATA:  Right-sided abdominal pain. Concern for free air. EXAM: ABDOMEN - 1 VIEW COMPARISON:  None.  FINDINGS: No evidence of free air on single supine view. No bowel dilatation to suggest obstruction. Small volume of colonic stool. Ventriculoperitoneal shunt catheter tubing. One tube tip is in the right mid abdomen, 1 tube tip in the left lower quadrant. Bilateral gluteal calcifications IMPRESSION: 1. No evidence of free air on the supine view. 2. Nonobstructive bowel gas pattern. Electronically Signed   By: Narda Rutherford M.D.   On: 03/12/2019 03:57   Ct Head Wo Contrast  Result Date: 03/11/2019 CLINICAL DATA:  Focal neural deficit.  Possible stroke. EXAM: CT HEAD WITHOUT CONTRAST TECHNIQUE: Contiguous axial images were obtained from the base of the skull through the vertex without intravenous contrast. COMPARISON:  CT scan of April 19, 2018. FINDINGS: Brain: Ventriculostomy catheter tip is seen in right frontal horn which is not significantly changed compared to prior exam. No hydrocephalus is noted. No mass effect or midline shift is noted. No hemorrhage, acute infarction or mass lesion is noted. Small chronic ischemic white matter disease is noted. Vascular: No hyperdense vessel or unexpected calcification. Skull: Right posterior parietal ventriculostomy is noted. No acute abnormality is noted. Sinuses/Orbits: No acute finding. Other: None. IMPRESSION: Stable position of right-sided ventriculostomy catheter with tip in right frontal horn. No hydrocephalus is noted. Small chronic ischemic white matter disease. No acute intracranial abnormality seen. Electronically Signed   By: Lupita Raider M.D.   On: 03/11/2019 16:00   Dg Chest Port 1 View  Result Date: 03/12/2019 CLINICAL DATA:  Abdominal pain. Clinical concern for free air. EXAM: PORTABLE CHEST 1 VIEW COMPARISON:  09/08/2014 FINDINGS: The cardiomediastinal contours are normal. Upper normal heart size. Ventriculoperitoneal shunt catheter tubing projects over the right chest, unchanged from prior. Pulmonary vasculature is normal. No  consolidation, pleural effusion, or pneumothorax. No acute osseous abnormalities are seen. No evidence of free air in the upper abdomen. IMPRESSION: 1. No acute chest findings. No evidence of free air in the upper abdomen. 2. Ventriculoperitoneal shunt tubing projects over the right chest. Electronically Signed   By: Narda Rutherford M.D.   On: 03/12/2019 03:56    Pending Labs Unresulted Labs (From admission, onward)   None      Vitals/Pain Today's Vitals   03/12/19 0500 03/12/19 0520 03/12/19 0615 03/12/19 0700  BP: (!) 206/75  (!) 209/80 (!) 182/71  Pulse: 74  82 81  Resp: 14  14 18   Temp:      TempSrc:      SpO2: 98%  98% 98%  Weight:      Height:      PainSc:  Asleep      Isolation Precautions No active isolations  Medications Medications  octreotide (SANDOSTATIN) 2 mcg/mL load via infusion 100 mcg (100 mcg Intravenous Bolus from Bag 03/11/19 1853)    And  octreotide (SANDOSTATIN) 500 mcg in sodium chloride 0.9 % 250 mL (2 mcg/mL) infusion (50 mcg/hr Intravenous New Bag/Given 03/12/19 0407)  pantoprazole (PROTONIX) 80 mg in sodium chloride 0.9 % 250 mL (0.32 mg/mL) infusion (8 mg/hr Intravenous New Bag/Given 03/12/19 0406)  0.9 %  sodium chloride infusion ( Intravenous Not Given 03/12/19 0520)  0.9 %  sodium chloride infusion ( Intravenous New Bag/Given 07/27/22 8250)  folic acid (FOLVITE) tablet 1 mg (1 mg Oral Not Given 03/11/19 1924)  thiamine (VITAMIN B-1) tablet 100 mg (100 mg Oral Not Given 03/11/19 1924)  multivitamin with minerals tablet 1 tablet (1 tablet Oral Not Given 03/11/19 1924)  DULoxetine (CYMBALTA) DR capsule 60 mg (60 mg Oral Not Given 03/11/19 1924)  hydrALAZINE (APRESOLINE) injection 5 mg (5 mg Intravenous Given 03/12/19 0630)  HYDROmorphone (DILAUDID) injection 0.5 mg (0.5 mg Intravenous Given 03/12/19 0248)  labetalol (NORMODYNE) injection 10 mg (10 mg Intravenous Given 03/12/19 0736)  0.9 %  sodium chloride infusion (Manually program via Guardrails IV Fluids)  ( Intravenous Stopped 03/11/19 2318)  pantoprazole (PROTONIX) 80 mg in sodium chloride 0.9 % 100 mL IVPB (0 mg Intravenous Stopped 03/11/19 1729)  famotidine (PEPCID) IVPB 20 mg premix (0 mg Intravenous Stopped 03/11/19 1911)  HYDROmorphone (DILAUDID) injection 0.25 mg (0.25 mg Intravenous Given 03/11/19 2145)  ondansetron (ZOFRAN) injection 4 mg (4 mg Intravenous Given 03/11/19 1925)  labetalol (NORMODYNE) injection 5 mg (5 mg Intravenous Given 03/11/19 2141)    Mobility walks with person assist     Focused Assessments Cardiac Assessment Handoff:  Cardiac Rhythm: Normal sinus rhythm No results found for: CKTOTAL, CKMB, CKMBINDEX, TROPONINI No results found for: DDIMER Does the Patient currently have chest pain? No      R Recommendations: See Admitting Provider Note  Report given to:   Additional Notes:

## 2019-03-12 NOTE — Anesthesia Preprocedure Evaluation (Signed)
Anesthesia Evaluation  Patient identified by MRN, date of birth, ID band Patient awake    Reviewed: Allergy & Precautions, H&P , NPO status , Patient's Chart, lab work & pertinent test results  Airway Mallampati: II   Neck ROM: full    Dental   Pulmonary neg pulmonary ROS,    breath sounds clear to auscultation       Cardiovascular hypertension,  Rhythm:regular Rate:Normal     Neuro/Psych PSYCHIATRIC DISORDERS Depression    GI/Hepatic melena   Endo/Other    Renal/GU Renal InsufficiencyRenal disease     Musculoskeletal   Abdominal   Peds  Hematology  (+) anemia ,   Anesthesia Other Findings   Reproductive/Obstetrics                             Anesthesia Physical Anesthesia Plan  ASA: III  Anesthesia Plan: MAC   Post-op Pain Management:    Induction: Intravenous  PONV Risk Score and Plan: 2 and Propofol infusion and Treatment may vary due to age or medical condition  Airway Management Planned: Nasal Cannula  Additional Equipment:   Intra-op Plan:   Post-operative Plan:   Informed Consent: I have reviewed the patients History and Physical, chart, labs and discussed the procedure including the risks, benefits and alternatives for the proposed anesthesia with the patient or authorized representative who has indicated his/her understanding and acceptance.       Plan Discussed with: CRNA, Anesthesiologist and Surgeon  Anesthesia Plan Comments:         Anesthesia Quick Evaluation

## 2019-03-12 NOTE — Op Note (Signed)
EGD was performed for melena, coffee-ground emesis and hematemesis.   Findings: Irregular Z line, suspicious for Barrett's esophagus and distal esophagus from 31 to 32 cm, biopsies taken. Biopsies taken from antrum for H. Pylori. 3 cm hiatal hernia. A deformity noted in the pyloric channel. A small nonbleeding but cratered 6 mm ulcer noted in the pyloric channel. Duodenal bulb and rest of the duodenum appeared unremarkable. Balloon dilatation with 12 mm balloon performed at the area of pyloric stenosis, minimal bleeding noted.   Recommendations: Await biopsy results, treat H. pylori if found, avoid NSAIDs and aspirin. PPI twice daily for 8 weeks, thereafter once a day indefinitely. Mechanical soft diet. Okay to DC in a.m. if hemoglobin remains stable.  Ronnette Juniper, MD

## 2019-03-12 NOTE — Progress Notes (Signed)
OT Cancellation Note  Patient Details Name: Kristen Campbell MRN: [id number] DOB: August 03, 1945   Cancelled Treatment:    Reason Eval/Treat Not Completed: Patient at procedure or test/ unavailable(Endoscopy. Will return as schedule allows.)  [place], OTR/L Acute Rehab Pager: [phone] Office: [phone] 03/12/2019, 10:47 AM

## 2019-03-12 NOTE — Evaluation (Addendum)
Occupational Therapy Evaluation Patient Details Name: Kristen Campbell MRN: [id number] DOB: [birth year] Today's Date: 03/12/2019    History of Present Illness Patient is a 73yo female with PMH of hypertension, gastric ulcers, anemia who presented with signs and symptoms consistent with an upper GI bleed (dyspnea, melena and coffee ground emesis)   Clinical Impression   PTA, pt was living with her daughter and son-in-law and was performing ADLs; family assist with IADLs. Pt currently requiring Min Guard A for LB ADLs and functional mobility with RW. Pt presenting with decreased balance and activity tolerance. Pt agreeable and motivated to participate in therapy and return to PLOF. Pt would benefit from further acute OT to facilitate safe dc. Recommend dc to home once medically stable per physician.      Follow Up Recommendations  No OT follow up;Supervision - Intermittent    Equipment Recommendations  None recommended by OT    Recommendations for Other Services       Precautions / Restrictions Precautions Precautions: Fall Restrictions Weight Bearing Restrictions: No      Mobility Bed Mobility Overal bed mobility: Modified Independent             General bed mobility comments: Increased time. Did not require assistance  Transfers Overall transfer level: Needs assistance Equipment used: Rolling walker (2 wheeled) Transfers: Sit to/from Stand Sit to Stand: Min guard         General transfer comment: Min Guard A for safety. steadying assist for initial stand    Balance Overall balance assessment: Needs assistance Sitting-balance support: No upper extremity supported;Feet supported Sitting balance-Leahy Scale: Fair     Standing balance support: Bilateral upper extremity supported;During functional activity Standing balance-Leahy Scale: Fair Standing balance comment: Standing at sink without UE support                           ADL either performed or  assessed with clinical judgement   ADL Overall ADL's : Needs assistance/impaired Eating/Feeding: Set up;Sitting   Grooming: Oral care;Min guard;Standing   Upper Body Bathing: Set up;Supervision/ safety;Sitting   Lower Body Bathing: Min guard;Sit to/from stand   Upper Body Dressing : Set up;Supervision/safety;Sitting   Lower Body Dressing: Min guard;Sit to/from stand   Toilet Transfer: Min guard;Ambulation;RW(Simulated to recliner)           Functional mobility during ADLs: Min guard;Rolling walker General ADL Comments: Pt presenting with decreased balance and activity tolerance. Performing ADLs at [place] level.     Vision Baseline Vision/History: Wears glasses Wears Glasses: Reading only Patient Visual Report: No change from baseline       Perception     Praxis      Pertinent Vitals/Pain Pain Assessment: No/denies pain     Hand Dominance Right   Extremity/Trunk Assessment Upper Extremity Assessment Upper Extremity Assessment: Generalized weakness   Lower Extremity Assessment Lower Extremity Assessment: Defer to PT evaluation   Cervical / Trunk Assessment Cervical / Trunk Assessment: Kyphotic   Communication Communication Communication: No difficulties   Cognition Arousal/Alertness: Awake/alert Behavior During Therapy: WFL for tasks assessed/performed Overall Cognitive Status: Within Functional Limits for tasks assessed                                     General Comments  VSS throughout. HR 80-90s. SpO2 >97% on RA    Exercises     Shoulder  Instructions      Home Living Family/patient expects to be discharged to:: Private residence Living Arrangements: Children Available Help at Discharge: Family Type of Home: House Home Access: Stairs to enter Secretary/administrator of Steps: 3 Entrance Stairs-Rails: (family helps her) Home Layout: Two level;Able to live on main level with bedroom/bathroom     Bathroom Shower/Tub:  Walk-in shower;Tub/shower unit   Bathroom Toilet: Standard     Home Equipment: Cane - single point;Bedside commode;Grab bars - tub/shower;Walker - 4 wheels;Shower seat          Prior Functioning/Environment Level of Independence: Independent        Comments: Independent with BADLs. Family helps with IADLs. Pt likes to take care and play with her grandbabies        OT Problem List: Decreased activity tolerance;Impaired balance (sitting and/or standing);Decreased knowledge of use of DME or AE      OT Treatment/Interventions: Self-care/ADL training;Therapeutic exercise;Energy conservation;DME and/or AE instruction;Therapeutic activities;Patient/family education    OT Goals(Current goals can be found in the care plan section) Acute Rehab OT Goals Patient Stated Goal: to go home OT Goal Formulation: With patient Time For Goal Achievement: 03/26/19 Potential to Achieve Goals: Good  OT Frequency: Min 2X/week   Barriers to D/C:            Co-evaluation PT/OT/SLP Co-Evaluation/Treatment: Yes Reason for Co-Treatment: For patient/therapist safety;To address functional/ADL transfers PT goals addressed during session: Mobility/safety with mobility OT goals addressed during session: ADL's and self-care      AM-PAC OT "6 Clicks" Daily Activity     Outcome Measure Help from another person eating meals?: None Help from another person taking care of personal grooming?: A Little Help from another person toileting, which includes using toliet, bedpan, or urinal?: A Little Help from another person bathing (including washing, rinsing, drying)?: A Little Help from another person to put on and taking off regular upper body clothing?: None Help from another person to put on and taking off regular lower body clothing?: A Little 6 Click Score: 20   End of Session Equipment Utilized During Treatment: Rolling walker Nurse Communication: Mobility status  Activity Tolerance: Patient  tolerated treatment well Patient left: in chair;with call bell/phone within reach(with PT)  OT Visit Diagnosis: Unsteadiness on feet (R26.81);Other abnormalities of gait and mobility (R26.89);Muscle weakness (generalized) (M62.81)                Time: 1356-1410 OT Time Calculation (min): 14 min Charges:  OT General Charges $OT Visit: 1 Visit OT Evaluation $OT Eval Low Complexity: 1 Low  Dominik Yordy MSOT, OTR/L Acute Rehab Pager: [phone] Office: [phone]  Theodoro Grist Antia Rahal 03/12/2019, 3:27 PM

## 2019-03-12 NOTE — ED Triage Notes (Signed)
TC from ENDO , pt will has ENDO test this AM.

## 2019-03-13 DIAGNOSIS — N179 Acute kidney failure, unspecified: Secondary | ICD-10-CM

## 2019-03-13 DIAGNOSIS — R531 Weakness: Secondary | ICD-10-CM

## 2019-03-13 LAB — COMPREHENSIVE METABOLIC PANEL
ALT: 16 U/L (ref 0–44)
AST: 17 U/L (ref 15–41)
Albumin: 3.2 g/dL — ABNORMAL LOW (ref 3.5–5.0)
Alkaline Phosphatase: 66 U/L (ref 38–126)
Anion gap: 6 (ref 5–15)
BUN: 26 mg/dL — ABNORMAL HIGH (ref 8–23)
CO2: 18 mmol/L — ABNORMAL LOW (ref 22–32)
Calcium: 8.7 mg/dL — ABNORMAL LOW (ref 8.9–10.3)
Chloride: 114 mmol/L — ABNORMAL HIGH (ref 98–111)
Creatinine, Ser: 1.53 mg/dL — ABNORMAL HIGH (ref 0.44–1.00)
GFR calc Af Amer: 39 mL/min — ABNORMAL LOW (ref >60)
GFR calc non Af Amer: 34 mL/min — ABNORMAL LOW (ref >60)
Glucose, Bld: 124 mg/dL — ABNORMAL HIGH (ref 70–99)
Potassium: 4 mmol/L (ref 3.5–5.1)
Sodium: 138 mmol/L (ref 135–145)
Total Bilirubin: 0.6 mg/dL (ref 0.3–1.2)
Total Protein: 5.5 g/dL — ABNORMAL LOW (ref 6.5–8.1)

## 2019-03-13 LAB — CBC
HCT: 22.6 % — ABNORMAL LOW (ref 36.0–46.0)
Hemoglobin: 7.5 g/dL — ABNORMAL LOW (ref 12.0–15.0)
MCH: 29.3 pg (ref 26.0–34.0)
MCHC: 33.2 g/dL (ref 30.0–36.0)
MCV: 88.3 fL (ref 80.0–100.0)
Platelets: 159 10*3/uL (ref 150–400)
RBC: 2.56 MIL/uL — ABNORMAL LOW (ref 3.87–5.11)
RDW: 14.7 % (ref 11.5–15.5)
WBC: 7 10*3/uL (ref 4.0–10.5)
nRBC: 1.3 % — ABNORMAL HIGH (ref 0.0–0.2)

## 2019-03-13 MED ORDER — AMLODIPINE BESYLATE 5 MG PO TABS
5.0000 mg | ORAL_TABLET | Freq: Every day | ORAL | Status: DC
  Administered 2019-03-13 – 2019-03-14 (×2): 5 mg via ORAL
  Filled 2019-03-13 (×2): qty 1

## 2019-03-13 MED ORDER — SODIUM CHLORIDE 0.9 % IV SOLN
510.0000 mg | Freq: Once | INTRAVENOUS | Status: AC
  Administered 2019-03-13: 510 mg via INTRAVENOUS
  Filled 2019-03-13: qty 17

## 2019-03-13 MED ORDER — OXYCODONE-ACETAMINOPHEN 5-325 MG PO TABS
1.0000 | ORAL_TABLET | Freq: Four times a day (QID) | ORAL | Status: DC | PRN
  Administered 2019-03-13: 2 via ORAL
  Administered 2019-03-14: 1 via ORAL
  Administered 2019-03-14: 2 via ORAL
  Filled 2019-03-13: qty 1
  Filled 2019-03-13 (×2): qty 2

## 2019-03-13 MED ORDER — PANTOPRAZOLE SODIUM 40 MG PO TBEC
40.0000 mg | DELAYED_RELEASE_TABLET | Freq: Two times a day (BID) | ORAL | Status: DC
  Administered 2019-03-13 – 2019-03-14 (×2): 40 mg via ORAL
  Filled 2019-03-13 (×2): qty 1

## 2019-03-13 NOTE — Progress Notes (Signed)
Subjective: No further bleeding. Mild abdominal pain.  Objective: Vital signs in last 24 hours: Temp:  [97.9 F (36.6 C)-99.3 F (37.4 C)] 99 F (37.2 C) (09/28 0754) Pulse Rate:  [60-82] 60 (09/28 0754) Resp:  [14-19] 14 (09/28 0754) BP: (124-186)/(63-101) 170/77 (09/28 0754) SpO2:  [96 %-100 %] 99 % (09/28 0754) Weight change:  Last BM Date: 03/11/19  PE: GEN:  Pale, NAD ABD:  Soft, mild tenderness, non distended, no peritonitis  Lab Results: CBC    Component Value Date/Time   WBC 7.0 03/13/2019 0303   RBC 2.56 (L) 03/13/2019 0303   HGB 7.5 (L) 03/13/2019 0303   HCT 22.6 (L) 03/13/2019 0303   PLT 159 03/13/2019 0303   MCV 88.3 03/13/2019 0303   MCH 29.3 03/13/2019 0303   MCHC 33.2 03/13/2019 0303   RDW 14.7 03/13/2019 0303   MONOABS 0.3 09/02/2006 1209   EOSABS 0.1 09/02/2006 1209   BASOSABS 0.0 09/02/2006 1209   CMP     Component Value Date/Time   NA 138 03/13/2019 0303   K 4.0 03/13/2019 0303   CL 114 (H) 03/13/2019 0303   CO2 18 (L) 03/13/2019 0303   GLUCOSE 124 (H) 03/13/2019 0303   BUN 26 (H) 03/13/2019 0303   CREATININE 1.53 (H) 03/13/2019 0303   CALCIUM 8.7 (L) 03/13/2019 0303   PROT 5.5 (L) 03/13/2019 0303   ALBUMIN 3.2 (L) 03/13/2019 0303   AST 17 03/13/2019 0303   ALT 16 03/13/2019 0303   ALKPHOS 66 03/13/2019 0303   BILITOT 0.6 03/13/2019 0303   GFRNONAA 34 (L) 03/13/2019 0303   GFRAA 39 (L) 03/13/2019 0303   Assessment:  1.  GI bleeding, likely from gastric ulcer in setting of NSAIDs. 2.  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 3.  Acute on chronic anemia.  Patient reports anemia for years.  Hgb 2008 10.6, so her current anemia seems worse than baseline.  Plan:  1.  Can change to oral PPI, which she will need to stay on indefinitely. 2.  Advancing diet as tolerated. 3.  Avoid NSAIDs. 4.  Awaiting gastric biopsies. 5.  Would watch patient's Hgb trend another day to make sure stable prior to  discharge. 5.  Hopefully discharge tomorrow. 6.  [facility] will follow.   Landry Dyke 03/13/2019, 9:49 AM   Cell [phone] If no answer or after 5 PM call [phone]

## 2019-03-13 NOTE — TOC Initial Note (Signed)
Transition of Care [facility]) - Initial/Assessment Note    Patient Details  Name: Kristen Campbell MRN: [id number] Date of Birth: 20-Feb-1946  Transition of Care [facility]) CM/SW Contact:    Benard Halsted, LCSW Phone Number: 03/13/2019, 5:32 PM  Clinical Narrative:                 CSW received consult for possible home health services at time of discharge. CSW spoke with patient regarding PT recommendation of Home Health PT at time of discharge. Patient reported that she would like home health services. She has moved in with her daughter and son in law ([address] [place]) since the passing of her husband last year. CSW spent time completing life review with patient, who enjoyed talking about her family and how they helped get her through his death. She requested CSW speak with her daughter regarding home health. CSW spoke with patient's son in law (daughter nearby but going out of town so he will be coordinating patient's return). He requested a rolling walker for patient. CSW will reach out to [employer] for delivery to the room. CSW confirmed PCP Irven Shelling at [facility] [phone]) and address with son in law. He will come pick him up at discharge. No further questions reported at this time. CSW to continue to follow and assist with discharge planning needs.   Expected Discharge Plan: [place] Barriers to Discharge: Continued Medical Work up   Patient Goals and CMS Choice Patient states their goals for this hospitalization and ongoing recovery are:: Return home CMS Medicare.gov Compare Post Acute Care list provided to:: Patient Choice offered to / list presented to : Patient, Adult Children  Expected Discharge Plan and Services Expected Discharge Plan: [place] In-house Referral: NA Discharge Planning Services: CM Consult Post Acute Care Choice: Durable Medical Equipment, Home Health Living arrangements for the past 2  months: Single Family Home                 DME Arranged: Walker rolling DME Agency: [employer]       HH Arranged: PT          Prior Living Arrangements/Services Living arrangements for the past 2 months: Single Family Home Lives with:: Adult Children Patient language and need for interpreter reviewed:: Yes Do you feel safe going back to the place where you live?: Yes      Need for Family Participation in Patient Care: No (Comment) Care giver support system in place?: Yes (comment)   Criminal Activity/Legal Involvement Pertinent to Current Situation/Hospitalization: No - Comment as needed  Activities of Daily Living      Permission Sought/Granted Permission sought to share information with : Facility Sport and exercise psychologist, Family Supports Permission granted to share information with : Yes, Verbal Permission Granted  Share Information with NAME: Manuela Schwartz (and her husband)  Permission granted to share info w AGENCY: [place] granted to share info w Relationship: Daughter/Son in Sports coach     Emotional Assessment Appearance:: Appears stated age Attitude/Demeanor/Rapport: Engaged Affect (typically observed): Accepting, Appropriate, Pleasant Orientation: : Oriented to Self, Oriented to Place, Oriented to  Time, Oriented to Situation Alcohol / Substance Use: Not Applicable Psych Involvement: No (comment)  Admission diagnosis:  Focal neurological deficit [R29.818] Abdominal pain [R10.9] Severe anemia [D64.9] Gastrointestinal hemorrhage associated with gastric ulcer [K25.4] Patient Active Problem List   Diagnosis Date Noted  . Pyloric ulcer 03/12/2019  . Essential hypertension 03/12/2019  .  Acute blood loss anemia 03/12/2019  . Upper GI bleed 03/11/2019  . Caregiver with fatigue 09/09/2014  . Adjustment disorder with depressed mood 09/09/2014   PCP:  Patient, No Pcp Per Pharmacy:   [facility] Adline Peals, [place] - [address] [place] [address] Phone: [phone] Fax: [phone]     Social Determinants of Health (SDOH) Interventions    Readmission Risk Interventions No flowsheet data found.

## 2019-03-13 NOTE — Progress Notes (Signed)
   Subjective: Pt states that she feels improved from yesterday. No further melena or coffee-ground emesis. Pt states she continues to have back pain and lower extremity weakness close to her baseline.  Objective:  Vital signs in last 24 hours: Vitals:   03/12/19 1538 03/12/19 1746 03/13/19 0556 03/13/19 0754  BP: (!) 152/84  (!) 124/101 (!) 170/77  Pulse: 72  75 60  Resp: 17  16 14   Temp:  98.5 F (36.9 C) 99.3 F (37.4 C) 99 F (37.2 C)  TempSrc:  Oral Oral   SpO2: 99%  100% 99%  Weight:      Height:       Physical Exam: General: NAD, alert and oriented, afebrile Cardio: RRR, no murmurs, rubs or gallops Abdominal: Soft. No distension. No abdominal tenderness. MSK: Muscles strength diminished in bilateral lower extremities (RLE>LLE, distal>proximal)-4/5 bilaterally Pulmonary: CTAB, no rales, rhonchi or wheezes Psych: Affect appropriate, engages well.  Assessment/Plan:  Principal Problem:   Upper GI bleed Active Problems:   Pyloric ulcer   Essential hypertension   Acute blood loss anemia  Patient is a 73yo female with PMH of hypertension, gastric ulcers, anemia who presented with signs and symptoms consistent with an upper GI bleed (dyspnea, melena and coffee ground emesis).  Per chart review, patient presented with a hemoglobin of 6.7 in 2012 and underwent a endoscopic evaluation which revealed a pyloric channel ulcer. Colonoscopy in January 2012 was unrevealing. Source of GI bleed this admission likely pyloric ulcer identified by EGD. No bleeding noted on EGD 09/29.  # Symptomatic anemia  Upper GI Bleed: Hgb stable-7.5 from 7.9. Bleeding source likely gastric ulcer identified on EGD 09/27 AM. - IV Pantoprazole-> PO pantoprazole 40mg  BID before meals - Continue folic acid 1mg  daily, thiamine 100mg  daily, multivitamin - CBC daily  # Elevated creatinine: Creatinine improved 1.53 from 1.93 likely prerenal AKI 2/2 volume depletion. - CMP daily  # HTN: BP remains  elevated at 170/73mmHg. - Start amlodipine 5mg  daily - Continue Bystolic 10mg  daily - Continue hydralazine 5mg  injection [id number] PRN for systolic NA>355DDUK or diastolic >025KYHC - Continue labetalol 10mg  injection [id number] PRN for systolic EG>315VVOH or diastolic >607PXTG  # Depression with anxiety: Stable. Depression contributed to by passing of husband in 08/19.  - Continue home duloxetine 60 mg daily  - Continue home hydroxyzine 25 mg nightly.  # Right-sided numbness: Improved to baseline since admission. Likely worsening of baseline lower extremity weakness due to anemia.  Dispo: Admit patient to Inpatient with expected length of stay greater than 2 midnights.   Diet: Heart healthy DVT prophylaxis: SCDs Dispo: Anticipate discharge 09/29 PM if Hgb stable.  LOS: 2 days   Flonnie Hailstone, Medical Student 03/13/2019, 10:39 AM

## 2019-03-14 ENCOUNTER — Encounter (HOSPITAL_COMMUNITY): Payer: Self-pay | Admitting: Gastroenterology

## 2019-03-14 DIAGNOSIS — Z885 Allergy status to narcotic agent status: Secondary | ICD-10-CM

## 2019-03-14 DIAGNOSIS — N179 Acute kidney failure, unspecified: Secondary | ICD-10-CM

## 2019-03-14 DIAGNOSIS — Z886 Allergy status to analgesic agent status: Secondary | ICD-10-CM

## 2019-03-14 DIAGNOSIS — Z888 Allergy status to other drugs, medicaments and biological substances status: Secondary | ICD-10-CM

## 2019-03-14 LAB — CBC
HCT: 23.8 % — ABNORMAL LOW (ref 36.0–46.0)
Hemoglobin: 7.7 g/dL — ABNORMAL LOW (ref 12.0–15.0)
MCH: 30 pg (ref 26.0–34.0)
MCHC: 32.4 g/dL (ref 30.0–36.0)
MCV: 92.6 fL (ref 80.0–100.0)
Platelets: 169 10*3/uL (ref 150–400)
RBC: 2.57 MIL/uL — ABNORMAL LOW (ref 3.87–5.11)
RDW: 14.9 % (ref 11.5–15.5)
WBC: 6.4 10*3/uL (ref 4.0–10.5)
nRBC: 0.9 % — ABNORMAL HIGH (ref 0.0–0.2)

## 2019-03-14 LAB — SURGICAL PATHOLOGY

## 2019-03-14 MED ORDER — AMLODIPINE BESYLATE 5 MG PO TABS: 5.0000 mg | ORAL_TABLET | Freq: Every day | ORAL | 0 refills | Status: DC

## 2019-03-14 MED ORDER — PANTOPRAZOLE SODIUM 40 MG PO TBEC: 40.0000 mg | DELAYED_RELEASE_TABLET | Freq: Two times a day (BID) | ORAL | 1 refills | Status: DC

## 2019-03-14 NOTE — Progress Notes (Signed)
PT Cancellation Note  Patient Details Name: Kristen Campbell MRN: [id number] DOB: 1945/12/11   Cancelled Treatment:    Reason Eval/Treat Not Completed: Patient declined, no reason specified. Reports she is having abdominal pain. Maybe later. Will return later if time allows.    Macedonio Scallon 03/14/2019, 12:23 PM

## 2019-03-14 NOTE — TOC Progression Note (Signed)
Transition of Care [facility]) - Progression Note    Patient Details  Name: Kristen Campbell MRN: [id number] Date of Birth: [birth year]  Transition of Care [facility]) CM/SW [place], LCSW Phone Number: 03/14/2019, 10:18 AM  Clinical Narrative:    CSW sent referral to the following home health agencies and patient was denied due to insurance: [facility], [facility], and [facility]. Patient has been accepted by [facility] and will assume services at the end of the week. CSW reached out to [employer] for a rolling walker.    Expected Discharge Plan: [place] Barriers to Discharge: Continued Medical Work up  Expected Discharge Plan and Services Expected Discharge Plan: [place] In-house Referral: NA Discharge Planning Services: CM Consult Post Acute Care Choice: Durable Medical Equipment, Home Health Living arrangements for the past 2 months: Single Family Home                 DME Arranged: Walker rolling DME Agency: [employer]       HH Arranged: PT           Social Determinants of Health (SDOH) Interventions    Readmission Risk Interventions No flowsheet data found.

## 2019-03-14 NOTE — Discharge Summary (Addendum)
Name: Kristen Campbell MRN: [id number] DOB: 06-Sep-1945 73 y.o. PCP: Patient, No Pcp Per  Date of Admission: 03/11/2019  1:49 PM Date of Discharge: 9/29/202009/29/2020 Attending Physician: Dr. Dareen Piano  Discharge Diagnosis: 1. Upper GI bleed 2. Iron Deficiency Anemia 3. Essential Hypertension 4. Prerenal AKI 5. Depression with anixety  Discharge Medications: Allergies as of 03/14/2019      Reactions   Carbamazepine Other (See Comments)   Hallucinations/ reaction to Tegretol   Potassium Chloride Itching   Reaction to KlorCon   Ibuprofen Itching   Codeine Nausea And Vomiting   Dilantin [phenytoin] Other (See Comments)   MD said never to take it - unknown reaction      Medication List    STOP taking these medications   aspirin EC 81 MG tablet   ibuprofen 200 MG tablet Commonly known as: ADVIL   omeprazole 40 MG capsule Commonly known as: PRILOSEC     TAKE these medications   amLODipine 5 MG tablet Commonly known as: NORVASC Take 1 tablet (5 mg total) by mouth daily.   DULoxetine 60 MG capsule Commonly known as: CYMBALTA Take 60 mg by mouth every morning.   gabapentin 100 MG capsule Commonly known as: NEURONTIN Take 100 mg by mouth at bedtime. For back pain   hydrOXYzine 25 MG tablet Commonly known as: ATARAX/VISTARIL Take 1 tablet (25 mg total) by mouth at bedtime.   LORazepam 1 MG tablet Commonly known as: ATIVAN Take 1 mg by mouth at bedtime.   nebivolol 10 MG tablet Commonly known as: BYSTOLIC Take 10 mg by mouth every morning.   pantoprazole 40 MG tablet Commonly known as: PROTONIX Take 1 tablet (40 mg total) by mouth 2 (two) times daily before a meal. What changed:   medication strength  how much to take       Disposition and follow-up:   Kristen Campbell was discharged from [facility] in Stable condition.  At the hospital follow up visit please address:  1.  Evaluate stability of hemoglobin given her recent GI  bleed.  Evaluate BP response to amlodipine and previous Bystolic.  Evaluate renal function.  Patient is in need of establishing care with a PCP  2.  Labs / imaging needed at time of follow-up: CMP, CBC  3.  Pending labs/ test needing follow-up: gastric H. Pylori biopsy   Follow-up Appointments: Follow-up Information    [facility] Follow up.   Specialty: Home Health Services Why: Home Health PT services arranged and will begin at the end of the week.  Contact information: [place] [address] [phone]        [place] Follow up.   Why: Rolling walker to be delivered to room prior to discharge.         -03/17/19 at [place] at 10:15 am -[facility] follow up appointment  Hospital Course by problem list: Summary: Kristen Campbell is a 73 year old female with past medical history of hypertension, VP shunt, gastric ulcer, anemia who presented with a 3-day history of dark stools in the setting of 2 to 3 weeks of weakness, dizziness, shortness of breath who was found to have an upper GI bleed likely 2/2 a gastric ulcer.  1. Symptomatic anemia   GI Bleed: Pt presented with a Hgb of 3.8 with symptoms of anemia, dark stools and coffee-ground emesis. Pt received 2 units pRBCs, was started on IV pantoprazole and had an EGD which revealed a  non-bleeding gastric ulcer. Hgb stable (7.3-7.9) since receiving 2 units pRBCs and starting pantoprazole. Pt discharged on pantoprazole 40mg  BID for 6 weeks. She will require pantoprazole daily to help prevent recurrence of gastric bleeding after BID 6 week course. Due to iron deficiency anemia, pt received 500mg  IV iron.  2. Essential Hypertions: Blood pressure elevated throughout hospitalization. Pt started on amlodipine 5mg  03/13/19 due to persistent elevation despite resolution of active GI bleeding.  3. Acute Kidney Injury: Creatinine 1.96 on presentation. Improved at discharge to  1.53. AKI likely prerenal due to volume depletion from GI bleed.  4. Depression w/anxiety: Stable during hospitalization. Continued on home duloxetine and hydroxyzine. Contributed to by passing of husband 08/19.  5. Right-sided weakness: Initial concern on presentation due to right-sided numbness and weakness. CT head negative for acute abnormality. Pt has chronic weakness due to non-described "back issues." Improved to baseline at discharge. Aggravated right-sided weakness likely contributed to by anemia and volume depletion.  Discharge Vitals:   BP (!) 157/61 (BP Location: Left Arm)    Pulse 60    Temp 97.9 F (36.6 C) (Oral)    Resp 12    Ht 5' (1.524 m)    Wt 63.1 kg    SpO2 95%    BMI 27.17 kg/m   Pertinent Labs, Studies, and Procedures:  Lab Results  Component Value Date   WBC 6.4 03/14/2019   HGB 7.7 (L) 03/14/2019   HCT 23.8 (L) 03/14/2019   MCV 92.6 03/14/2019   PLT 169 03/14/2019   CMP Latest Ref Rng & Units 03/13/2019 03/12/2019 03/11/2019  Glucose 70 - 99 mg/dL 03/15/2019) 03/14/2019) 03/13/2019)  BUN 8 - 23 mg/dL 992(E) 268(T) 419(Q)  Creatinine 0.44 - 1.00 mg/dL 22(W) 97(L) 89(Q)  Sodium 135 - 145 mmol/L 138 140 138  Potassium 3.5 - 5.1 mmol/L 4.0 5.3(H) 4.5  Chloride 98 - 111 mmol/L 114(H) 113(H) 111  CO2 22 - 32 mmol/L 18(L) 15(L) 18(L)  Calcium 8.9 - 10.3 mg/dL 1.19(E) 9.2 9.1  Total Protein 6.5 - 8.1 g/dL 1.74(Y) 6.5 6.8  Total Bilirubin 0.3 - 1.2 mg/dL 0.6 2.1(H) 0.4  Alkaline Phos 38 - 126 U/L 66 69 68  AST 15 - 41 U/L 17 44(H) 12(L)  ALT 0 - 44 U/L 16 18 13     Discharge Instructions: Discharge Instructions    Call MD for:  difficulty breathing, headache or visual disturbances   Complete by: As directed    Call MD for:  extreme fatigue   Complete by: As directed    Call MD for:  persistant dizziness or light-headedness   Complete by: As directed    Call MD for:  persistant nausea and vomiting   Complete by: As directed    Call MD for:  severe uncontrolled pain    Complete by: As directed    Diet - low sodium heart healthy   Complete by: As directed    Discharge instructions   Complete by: As directed    It was a pleasure to take care of you!  You were found to have an upper gastrointestinal tract bleed. The gastrointestinal tract goes from the mouth to the anus. It is often called the GI tract. Bleeding in the upper GI tract can happen anywhere from the esophagus to the first part of the small intestine. In your case, you have an ulcer in your stomach that is the likely cause of your bleed. Light bleeding may not cause any symptoms at first. But if you  continue to bleed for a while, you may feel very weak, tired or short of breath. If you develop recurrent/worsening shortness of breath, dark or bloody stools, please return to your nearest emergency department. To help prevent recurrence of your GI bleed, you were placed on pantoprazole twice daily. This medication suppresses stomach acid that can irritate the ulcer in your stomach. You will take this twice daily for 6 weeks. Take this medication 30 minutes before meals. After 6 weeks, you will take this medication once daily. The ulcer in you stomach was identified as the most likely source of your GI bleed. You will follow up in our clinic [facility] Friday to make sure you haven't lost any additional blood. Your appointment [facility] 10:15am [facility]. You will be contacted by [facility] about an outpatient follow-up appointment as well.   You were also found to have elevated blood pressure during your admission. You were started on amlodipine 5mg  daily to help lower your blood pressure. You may require additional blood pressure medication depending on your response to the medication. This will be adjusted as needed at your follow up appointment.   Increase activity slowly   Complete by: As directed       It was a pleasure to take care of you!    You were found to have an upper gastrointestinal tract bleed. The gastrointestinal tract goes from the mouth to the anus. It is often called the GI tract. Bleeding in the upper GI tract can happen anywhere from the esophagus to the first part of the small intestine. In your case, you have an ulcer in your stomach that is the likely cause of your bleed. Light bleeding may not cause any symptoms at first. But if you continue to bleed for a while, you may feel very weak, tired or short of breath. If you develop recurrent/worsening shortness of breath, dark or bloody stools, please return to your nearest emergency department.  To help prevent recurrence of your GI bleed, you were placed on pantoprazole twice daily. This medication suppresses stomach acid that can irritate the ulcer in your stomach. You will take this twice daily for 6 weeks. Take this medication 30 minutes before meals. After 6 weeks, you will take this medication once daily. The ulcer in you stomach was identified as the most likely source of your GI bleed. You will follow up in our clinic on the ground floor of [facility] Friday to make sure you haven't lost any additional blood. Your appointment time is 10:15am at the [facility]. You will be [facility] by [facility] about an outpatient follow-up appointment as well.    You were also found to have elevated blood pressure during your admission. You were started on amlodipine 5mg  daily to help lower your blood pressure. You may require additional blood pressure medication depending on your response to the medication. This will be adjusted as needed at your follow up appointment.  Signed: Lanelle BalHarbrecht, Xavyer Steenson, MD 03/14/2019, 6:13 PM

## 2019-03-14 NOTE — Progress Notes (Signed)
   Subjective: Pt has no additional complaints today. No melena or coffee-ground emesis. Pt excited to go home.  Objective:  Vital signs in last 24 hours: Vitals:   03/13/19 1936 03/13/19 2319 03/14/19 0304 03/14/19 0618  BP: (!) 138/57  (!) 161/74 (!) 157/55  Pulse: (!) 56  65 (!) 48  Resp: 16  17 14   Temp: 99.1 F (37.3 C) 98.7 F (37.1 C) 98.1 F (36.7 C)   TempSrc: Oral Oral Oral   SpO2: 96%  96% 95%  Weight:   63.1 kg   Height:       Physical Exam: General: NAD, alert and oriented, afebrile Cardio: RRR, no m/r/g Abdominal: Soft. No distension. Non-tender to palpation Pulmonary: CTAB, no r/r/w Psych: Affect appropriate, engages well.  Assessment/Plan:  Principal Problem:   Upper GI bleed Active Problems:   Pyloric ulcer   Essential hypertension   Acute blood loss anemia   Acute kidney injury ([place])  Patient is a 73yo female with PMH of hypertension, gastric ulcers, anemia who presented with signs and symptoms consistent with an upper GI bleed (dyspnea, melena and coffee ground emesis).  Per chart review, patient presented with a hemoglobin of 6.7 in 2012 and underwent a endoscopic evaluation which revealed a pyloric channel ulcer. Colonoscopy in January 2012 was unrevealing. Source of GI bleed this admission likely pyloric ulcer identified by EGD. No bleeding noted on EGD 09/29.  # Symptomatic anemia  Upper GI Bleed: Hgb stable-7.7 from 7.5. - Continue PO pantoprazole 40mg  BID before meals for 6 weeks - Discontinue folic acid 1mg  daily, thiamine 100mg  daily, multivitamin at discharge - Repeat CBC Friday - Home health PT  # Elevated creatinine: Creatinine improved yesterday. - Repeat CMP Friday  # HTN: BP remains elevated at 157/70mmHg. - Continue amlodipine 5mg  daily - Continue Bystolic 10mg  daily - Follow-up BP evaluation Friday  # Depression with anxiety: Stable. Depression contributed to by passing of husband in 08/19.  - Continue home duloxetine 60 mg  daily  - Continue home hydroxyzine 25 mg nightly.  # Right-sided numbness: Resolved. Baseline weakness from chronic "back issues." -Home health PT  Dispo: Admit patient to Inpatient with expected length of stay greater than 2 midnights.   Diet: Heart healthy DVT prophylaxis: SCDs Dispo: Discharge 09/29 PM   LOS: 3 days   Flonnie Hailstone, Medical Student 03/14/2019, 8:15 AM

## 2019-03-14 NOTE — Care Management Important Message (Signed)
Important Message  Patient Details  Name: Kristen Campbell MRN: [id number] Date of Birth: 08/11/1945   Medicare Important Message Given:  Yes     Orbie Pyo 03/14/2019, 3:25 PM

## 2019-03-14 NOTE — Progress Notes (Signed)
Subjective: No hematemesis or blood in stool. Abdominal pain improving.  Objective: Vital signs in last 24 hours: Temp:  [98.1 F (36.7 C)-99.1 F (37.3 C)] 98.1 F (36.7 C) (09/29 0855) Pulse Rate:  [48-65] 60 (09/29 0855) Resp:  [11-19] 12 (09/29 0855) BP: (138-161)/(55-74) 157/61 (09/29 0855) SpO2:  [95 %-100 %] 95 % (09/29 0855) Weight:  [63.1 kg] 63.1 kg (09/29 0304) Weight change:  Last BM Date: 03/11/19  PE: GEN:  NAD, pale ABD:  Soft, mild tenderness epigastric without peritonitis  Lab Results: CBC    Component Value Date/Time   WBC 6.4 03/14/2019 0318   RBC 2.57 (L) 03/14/2019 0318   HGB 7.7 (L) 03/14/2019 0318   HCT 23.8 (L) 03/14/2019 0318   PLT 169 03/14/2019 0318   MCV 92.6 03/14/2019 0318   MCH 30.0 03/14/2019 0318   MCHC 32.4 03/14/2019 0318   RDW 14.9 03/14/2019 0318   MONOABS 0.3 09/02/2006 1209   EOSABS 0.1 09/02/2006 1209   BASOSABS 0.0 09/02/2006 1209   CMP     Component Value Date/Time   NA 138 03/13/2019 0303   K 4.0 03/13/2019 0303   CL 114 (H) 03/13/2019 0303   CO2 18 (L) 03/13/2019 0303   GLUCOSE 124 (H) 03/13/2019 0303   BUN 26 (H) 03/13/2019 0303   CREATININE 1.53 (H) 03/13/2019 0303   CALCIUM 8.7 (L) 03/13/2019 0303   PROT 5.5 (L) 03/13/2019 0303   ALBUMIN 3.2 (L) 03/13/2019 0303   AST 17 03/13/2019 0303   ALT 16 03/13/2019 0303   ALKPHOS 66 03/13/2019 0303   BILITOT 0.6 03/13/2019 0303   GFRNONAA 34 (L) 03/13/2019 0303   GFRAA 39 (L) 03/13/2019 0303    Assessment:  1.  GI bleeding, likely from gastric ulcer in setting of NSAIDs. No further overt GI bleeding. 2.  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 3.  Acute on chronic anemia.  Patient reports anemia for years.  Hgb 2008 10.6, so her current anemia seems worse than baseline.  Hgb stable for the past couple days.   Plan:  1.  No NSAIDs. 2.  Soft diet for few days, then advance as tolerated. 3.  Pantoprazole 40 mg po bid qac x 6  weeks, then 40 mg po qd thereafter indefinitely.  Will follow-up gastric biopsies for h. Pylori (results pending) as outpatient. 4.  Patient ok for discharge today from GI perspective; we will arrange outpatient follow-up with Dr. Therisa Doyne. 5.  [facility] will sign-off; please call with questions; thank you for the consultation.   Landry Dyke 03/14/2019, 9:57 AM   Cell [phone] If no answer or after 5 PM call [phone]

## 2019-03-14 NOTE — Progress Notes (Signed)
Brenna Desantiago to be D/C'd Home per MD order.  Discussed with the patient and all questions fully answered.  VSS, Skin clean, dry and intact without evidence of skin break down, no evidence of skin tears noted. IV catheter discontinued intact. Site without signs and symptoms of complications. Dressing and pressure applied.  An After Visit Summary was printed and given to the patient. Patient received prescription.  D/c education completed with patient/family including follow up instructions, medication list, d/c activities limitations if indicated, with other d/c instructions as indicated by MD - patient able to verbalize understanding, all questions fully answered.   Patient instructed to return to ED, call 911, or call MD for any changes in condition.   Patient escorted via [place], and D/C home via private auto.   [place] 03/14/2019 2:14 PM

## 2019-03-14 NOTE — Anesthesia Postprocedure Evaluation (Signed)
Anesthesia Post Note  Patient: Kristen Campbell  Procedure(s) Performed: ESOPHAGOGASTRODUODENOSCOPY (EGD) WITH PROPOFOL (N/A ) BIOPSY BALLOON DILATION (N/A )     Patient location during evaluation: Endoscopy Anesthesia Type: MAC Level of consciousness: awake and alert Pain management: pain level controlled Vital Signs Assessment: post-procedure vital signs reviewed and stable Respiratory status: spontaneous breathing, nonlabored ventilation, respiratory function stable and patient connected to nasal cannula oxygen Cardiovascular status: blood pressure returned to baseline and stable Postop Assessment: no apparent nausea or vomiting Anesthetic complications: no    Last Vitals:  Vitals:   03/14/19 0618 03/14/19 0855  BP: (!) 157/55 (!) 157/61  Pulse: (!) 48 60  Resp: 14 12  Temp:  36.7 C  SpO2: 95% 95%    Last Pain:  Vitals:   03/14/19 0855  TempSrc: Oral  PainSc:                  [place] S

## 2019-03-17 ENCOUNTER — Ambulatory Visit (INDEPENDENT_AMBULATORY_CARE_PROVIDER_SITE_OTHER): Payer: Medicare Other | Admitting: Internal Medicine

## 2019-03-17 ENCOUNTER — Encounter: Payer: Self-pay | Admitting: Internal Medicine

## 2019-03-17 ENCOUNTER — Other Ambulatory Visit: Payer: Self-pay

## 2019-03-17 VITALS — BP 167/73 | HR 57 | Temp 99.1°F | Ht 60.0 in | Wt 131.6 lb

## 2019-03-17 DIAGNOSIS — Z8719 Personal history of other diseases of the digestive system: Secondary | ICD-10-CM | POA: Diagnosis not present

## 2019-03-17 DIAGNOSIS — I10 Essential (primary) hypertension: Secondary | ICD-10-CM

## 2019-03-17 DIAGNOSIS — F4321 Adjustment disorder with depressed mood: Secondary | ICD-10-CM

## 2019-03-17 DIAGNOSIS — K922 Gastrointestinal hemorrhage, unspecified: Secondary | ICD-10-CM | POA: Diagnosis not present

## 2019-03-17 DIAGNOSIS — N179 Acute kidney failure, unspecified: Secondary | ICD-10-CM

## 2019-03-17 DIAGNOSIS — Z8711 Personal history of peptic ulcer disease: Secondary | ICD-10-CM

## 2019-03-17 DIAGNOSIS — Z79899 Other long term (current) drug therapy: Secondary | ICD-10-CM

## 2019-03-17 DIAGNOSIS — Z23 Encounter for immunization: Secondary | ICD-10-CM | POA: Diagnosis not present

## 2019-03-17 NOTE — Patient Instructions (Signed)
Thank you for allowing me to provide your care. Today we are going to check some blood work after your recent hospitalization. I will call you with the results as soon as they are available.   Try to stop the Lorazepam (Ativan). You are currently on 1 mg every night. You can cut this in half and take it for the next 3-4 night then stop completely.   Check your blood pressure at home 1-2 times per day. Call the office [phone]) next Wednesday with the values. If they are consistently >130/80 we will need to start another medication.   Continue all other medications as prescribed. Please come back in 3 months for continued medical care.

## 2019-03-17 NOTE — Assessment & Plan Note (Signed)
HPI:  Patient currently on Duloxetine, Hydroxyzine, and Ativan. She states that the Hydroxyzine and Ativan were started after the death of her husband. She is no longer taking the Hydroxyzine. Continues to take the Ativan 1 mg QHS.   A/P: - Discussed the risks/benefits of Ativan in her age group. Will try to discontinue medication.  - Continue Duloxetine.

## 2019-03-17 NOTE — Progress Notes (Signed)
   CC: GI bleed, AKI, HTN  HPI:  Kristen Campbell is a 73 y.o. female with PMHx listed below presenting for a hospital follow-up. Please see the A&P for the status of the patient's chronic medical problems.  Past Medical History:  Diagnosis Date  . Brain aneurysm   . Depression   . Hypertension    Past Surgical History:  Procedure Laterality Date  . ABDOMINAL SURGERY     fluid tumor removal  . ABDOMINAL SURGERY     bleeding ulcers  . BALLOON DILATION N/A 03/12/2019   Procedure: BALLOON DILATION;  Surgeon: Ronnette Juniper, MD;  Location: [employer];  Service: Gastroenterology;  Laterality: N/A;  . BIOPSY  03/12/2019   Procedure: BIOPSY;  Surgeon: Ronnette Juniper, MD;  Location: [facility] ENDOSCOPY;  Service: Gastroenterology;;  . CEREBRAL ANEURYSM REPAIR    . CSF SHUNT    . ESOPHAGOGASTRODUODENOSCOPY (EGD) WITH PROPOFOL N/A 03/12/2019   Procedure: ESOPHAGOGASTRODUODENOSCOPY (EGD) WITH PROPOFOL;  Surgeon: Ronnette Juniper, MD;  Location: [place];  Service: Gastroenterology;  Laterality: N/A;  . ROTATOR CUFF REPAIR     Current Outpatient Medications on File Prior to Visit  Medication Sig Dispense Refill  . amLODipine (NORVASC) 5 MG tablet Take 1 tablet (5 mg total) by mouth daily. 30 tablet 0  . DULoxetine (CYMBALTA) 60 MG capsule Take 60 mg by mouth every morning.   0  . gabapentin (NEURONTIN) 100 MG capsule Take 100 mg by mouth at bedtime. For back pain    . LORazepam (ATIVAN) 1 MG tablet Take 1 mg by mouth at bedtime.    . nebivolol (BYSTOLIC) 10 MG tablet Take 10 mg by mouth every morning.    . pantoprazole (PROTONIX) 40 MG tablet Take 1 tablet (40 mg total) by mouth 2 (two) times daily before a meal. 60 tablet 1   No current facility-administered medications on file prior to visit.    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  Husband died in  2019/10/08 and she moved to [facility] to live with her daughter Denies the use of EtOH, Tobacco, illicit substances.   Review of Systems:  Performed and all others negative.  Physical Exam: Vitals:   03/17/19 1023  BP: (!) 167/73  Pulse: (!) 57  Temp: 99.1 F (37.3 C)  TempSrc: Oral  SpO2: 100%  Weight: 131 lb 9.6 oz (59.7 kg)   General: Well nourished female in no acute distress HENT: Normocephalic, atraumatic, moist mucus membranes Pulm: Good air movement with no wheezing or crackles  CV: Bradycardic but regular rhythm, no murmurs, no rubs  Abdomen: Active bowel sounds, soft, non-distended, no tenderness to palpation  Extremities: Pulses palpable in all extremities, no LE edema  Skin: Warm and dry  Neuro: Alert and oriented x 3  Assessment & Plan:   See Encounters Tab for problem based charting.  Patient discussed with Dr. Rebeca Alert

## 2019-03-17 NOTE — Assessment & Plan Note (Signed)
HPI;  Recently admitted for UGIB 2/2 gastric ulcers. She was started on BID PPI therapy. Since being discharge home she has had no further melena. Denies SHOB, fatigue, CP. She has been taking her PPI as prescribed.   A/P: - Check CBC - Continue PPI BID for additional 5 weeks before transitioning to QD therapy

## 2019-03-17 NOTE — Assessment & Plan Note (Signed)
HPI:  Patient recently admitted for UGIB. Found to have AKI on admission, initially 1.96 but down trended to 1.53 at discharge.   A/P: - Recheck BMP today

## 2019-03-17 NOTE — Assessment & Plan Note (Signed)
HPI:  Patient with known HTN. She was started on Amlodipine 5 mg in the hospital in addition to her Bystolic. She has been taking her medication as prescribed. No apparent side effects.   A/P: - Uncontrolled  - Continue Amlodipine 5 mg QD and Bystolic 10 mg QD  - Recheck BMP  - Patient will check home BP over next 5 days and call with results. If consistently >130/80 will need to start additional therapy. Would favor HCTZ or chlorthalidone.

## 2019-03-17 NOTE — Progress Notes (Signed)
Internal Medicine Clinic Attending  Case discussed with Dr. Helberg at the time of the visit.  We reviewed the resident's history and exam and pertinent patient test results.  I agree with the assessment, diagnosis, and plan of care documented in the resident's note.  Alexander Raines, M.D., Ph.D.  

## 2019-03-18 LAB — BMP8+ANION GAP
Anion Gap: 18 mmol/L (ref 10.0–18.0)
BUN/Creatinine Ratio: 12 (ref 12–28)
BUN: 17 mg/dL (ref 8–27)
CO2: 17 mmol/L — ABNORMAL LOW (ref 20–29)
Calcium: 8.9 mg/dL (ref 8.7–10.3)
Chloride: 110 mmol/L — ABNORMAL HIGH (ref 96–106)
Creatinine, Ser: 1.39 mg/dL — ABNORMAL HIGH (ref 0.57–1.00)
GFR calc Af Amer: 44 mL/min/{1.73_m2} — ABNORMAL LOW (ref >59)
GFR calc non Af Amer: 38 mL/min/{1.73_m2} — ABNORMAL LOW (ref >59)
Glucose: 108 mg/dL — ABNORMAL HIGH (ref 65–99)
Potassium: 3.7 mmol/L (ref 3.5–5.2)
Sodium: 145 mmol/L — ABNORMAL HIGH (ref 134–144)

## 2019-03-18 LAB — CBC WITH DIFFERENTIAL/PLATELET
Basophils Absolute: 0.1 10*3/uL (ref 0.0–0.2)
Basos: 0 %
EOS (ABSOLUTE): 0.2 10*3/uL (ref 0.0–0.4)
Eos: 1 %
Hematocrit: 28.3 % — ABNORMAL LOW (ref 34.0–46.6)
Hemoglobin: 8.7 g/dL — ABNORMAL LOW (ref 11.1–15.9)
Immature Grans (Abs): 0.1 10*3/uL (ref 0.0–0.1)
Immature Granulocytes: 1 %
Lymphocytes Absolute: 0.7 10*3/uL (ref 0.7–3.1)
Lymphs: 5 %
MCH: 28 pg (ref 26.6–33.0)
MCHC: 30.7 g/dL — ABNORMAL LOW (ref 31.5–35.7)
MCV: 91 fL (ref 79–97)
Monocytes Absolute: 0.7 10*3/uL (ref 0.1–0.9)
Monocytes: 5 %
Neutrophils Absolute: 12.4 10*3/uL — ABNORMAL HIGH (ref 1.4–7.0)
Neutrophils: 88 %
Platelets: 218 10*3/uL (ref 150–450)
RBC: 3.11 x10E6/uL — ABNORMAL LOW (ref 3.77–5.28)
RDW: 16.1 % — ABNORMAL HIGH (ref 11.7–15.4)
WBC: 14.2 10*3/uL — ABNORMAL HIGH (ref 3.4–10.8)

## 2019-03-20 ENCOUNTER — Telehealth: Payer: Self-pay | Admitting: Internal Medicine

## 2019-03-20 NOTE — Telephone Encounter (Signed)
Called the patient to discuss her recent lab work. Discussed that her renal function is improving. Her hemoglobin is also improving. She did have a leukocytosis with left shift. She denies infectious symptoms at this point. I have asked her to monitor for infectious symptoms and return a call to the clinic if anything arises. She voices understanding. Plan was also discussed with her son-in-law.

## 2019-04-05 ENCOUNTER — Ambulatory Visit: Payer: Medicare Other | Admitting: Internal Medicine

## 2019-04-05 ENCOUNTER — Telehealth: Payer: Self-pay | Admitting: *Deleted

## 2019-04-05 ENCOUNTER — Other Ambulatory Visit: Payer: Self-pay

## 2019-04-05 DIAGNOSIS — R600 Localized edema: Secondary | ICD-10-CM

## 2019-04-05 NOTE — Telephone Encounter (Signed)
Spoke with PCP, he will call patient/husband this afternoon to discuss edema. Husband notified and is very Patent attorney. Hubbard Hartshorn, BSN, RN-BC

## 2019-04-05 NOTE — Telephone Encounter (Signed)
Patient's husband called in stating patient began with one leg swelling last evening and this morning both legs are swollen and one hand is swollen. Offered ACC visit this afternoon, however, patient has an MRI scheduled for 2:15. Please advise. Hubbard Hartshorn, BSN, RN-BC

## 2019-04-05 NOTE — Progress Notes (Signed)
Patient calls with 2 to 3 days of bilateral lower extremity edema and swelling in her hands. She is unable to come into an appointment today as she is getting an MRI. I spoke with the front office and they will schedule her for 915 tomorrow a.m. with me.

## 2019-04-06 ENCOUNTER — Ambulatory Visit (INDEPENDENT_AMBULATORY_CARE_PROVIDER_SITE_OTHER): Payer: Medicare Other | Admitting: Internal Medicine

## 2019-04-06 ENCOUNTER — Other Ambulatory Visit: Payer: Self-pay

## 2019-04-06 ENCOUNTER — Encounter: Payer: Self-pay | Admitting: Internal Medicine

## 2019-04-06 VITALS — BP 178/57 | HR 54 | Temp 98.4°F | Ht 60.0 in | Wt 137.0 lb

## 2019-04-06 DIAGNOSIS — Z79899 Other long term (current) drug therapy: Secondary | ICD-10-CM | POA: Diagnosis not present

## 2019-04-06 DIAGNOSIS — R600 Localized edema: Secondary | ICD-10-CM | POA: Diagnosis not present

## 2019-04-06 MED ORDER — FUROSEMIDE 40 MG PO TABS: 40.0000 mg | ORAL_TABLET | Freq: Every day | ORAL | 0 refills | Status: DC

## 2019-04-06 NOTE — Progress Notes (Signed)
   CC: Bilateral LE edema  HPI:  [name] is a 72 y.o. female with PMHx listed below presenting for bilateral LE edema. Please see the A&P for the status of the patient's chronic medical problems.  Past Medical History:  Diagnosis Date  . Brain aneurysm   . Depression   . Hypertension    Review of Systems:  Performed and all others negative.  Physical Exam: Vitals:   04/06/19 0915 04/06/19 0917  BP:  (!) 178/57  Pulse:  (!) 54  Temp:  98.4 F (36.9 C)  TempSrc:  Oral  SpO2:  100%  Weight: 137 lb (62.1 kg)   Height: 5' (1.524 m)    General: Well nourished female in no acute distress Pulm: Good air movement with no wheezing or crackles  CV: RRR, no murmurs, no rubs, no JVD Extremities: Left LE edema up to the knee (pitting), right leg unable to be assessed due to boot in place  POCUS: LVH, EPSS <1cm, Aortic valve opens, RV < LV size, IVC <2.1cm and >50% variation with respirations. IJ tapers towards the mandible and has respiratory variation   Assessment & Plan:   See Encounters Tab for problem based charting.  Patient seen with Dr. Evette Doffing

## 2019-04-06 NOTE — Patient Instructions (Signed)
Thank you for allowing me to provide your care. Today we are checking some labs. I will call you back when I have these results. I would like you to schedule an appointment in four weeks unless the lower extremity swelling has resolved. Please call me if you have any questions or concerns.

## 2019-04-07 ENCOUNTER — Encounter: Payer: Self-pay | Admitting: Internal Medicine

## 2019-04-07 DIAGNOSIS — R600 Localized edema: Secondary | ICD-10-CM | POA: Insufficient documentation

## 2019-04-07 LAB — CMP14 + ANION GAP
ALT: 12 IU/L (ref 0–32)
AST: 13 IU/L (ref 0–40)
Albumin/Globulin Ratio: 1.4 (ref 1.2–2.2)
Albumin: 3.8 g/dL (ref 3.7–4.7)
Alkaline Phosphatase: 114 IU/L (ref 39–117)
Anion Gap: 19 mmol/L — ABNORMAL HIGH (ref 10.0–18.0)
BUN/Creatinine Ratio: 13 (ref 12–28)
BUN: 15 mg/dL (ref 8–27)
Bilirubin Total: <0.2 mg/dL (ref 0.0–1.2)
CO2: 20 mmol/L (ref 20–29)
Calcium: 9.2 mg/dL (ref 8.7–10.3)
Chloride: 106 mmol/L (ref 96–106)
Creatinine, Ser: 1.12 mg/dL — ABNORMAL HIGH (ref 0.57–1.00)
GFR calc Af Amer: 57 mL/min/{1.73_m2} — ABNORMAL LOW (ref >59)
GFR calc non Af Amer: 50 mL/min/{1.73_m2} — ABNORMAL LOW (ref >59)
Globulin, Total: 2.8 g/dL (ref 1.5–4.5)
Glucose: 91 mg/dL (ref 65–99)
Potassium: 3.3 mmol/L — ABNORMAL LOW (ref 3.5–5.2)
Sodium: 145 mmol/L — ABNORMAL HIGH (ref 134–144)
Total Protein: 6.6 g/dL (ref 6.0–8.5)

## 2019-04-07 LAB — PROTEIN / CREATININE RATIO, URINE
Creatinine, Urine: 45.3 mg/dL
Protein, Ur: 23.4 mg/dL
Protein/Creat Ratio: 517 mg/g creat — ABNORMAL HIGH (ref 0–200)

## 2019-04-07 NOTE — Assessment & Plan Note (Signed)
Patient presents for evaluation of bilateral lower extremity edema. She fell approximately 3 to 4 days ago and presented to her orthopedic doctor. Images of her right ankle were negative for any acute fracture however she was placed in a walking boot. Since that time she is had decreased mobility and has noticed bilateral lower extremity edema. She feels that it is progressive. It is getting to the point where causes significant discomfort. She has not noticed any change with elevation in her legs. She denies any change to her urine including hematuria or frothy/foamy urine. She denies orthopnea or exertional dyspnea.  On physical exam she has left lower extremity up to the knee. Her right lower extremity cannot be assessed she is walking boot place. Her focus illustrated LVH within normal left ventricular ejection fraction. 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  A/P: - Significant LVH but no evidence of increased filling pressures making HFpEF unlikely - CMP with normal albumin and hepatic function  - Checking protein to creatinine ratio.  - Symptomatic management with Lasix 40 mg QD  - Likely secondary to medication side effect (amlodipine) vs immobility

## 2019-04-07 NOTE — Progress Notes (Signed)
Internal Medicine Clinic Attending  I saw and evaluated the patient.  I personally confirmed the key portions of the history and exam documented by Dr. Helberg and I reviewed pertinent patient test results.  The assessment, diagnosis, and plan were formulated together and I agree with the documentation in the resident's note. 

## 2019-04-10 ENCOUNTER — Other Ambulatory Visit: Payer: Self-pay | Admitting: Internal Medicine

## 2019-04-11 ENCOUNTER — Telehealth: Payer: Self-pay | Admitting: Internal Medicine

## 2019-04-11 NOTE — Telephone Encounter (Signed)
Called patient to discuss her recent lab results in a C however lower extremity swelling was doing. Her swelling is now minimal. She is still on the Lasix. We discussed the trial off the Lasix to see if her swelling occurs. If it does we may need to stop her amlodipine. They voiced understanding. All questions and concerns addressed.  Ina Homes, MD IMTS PGY3

## 2019-04-19 ENCOUNTER — Encounter: Payer: Self-pay | Admitting: *Deleted

## 2019-04-25 ENCOUNTER — Other Ambulatory Visit: Payer: Self-pay | Admitting: Internal Medicine

## 2019-04-25 DIAGNOSIS — R600 Localized edema: Secondary | ICD-10-CM

## 2019-04-28 ENCOUNTER — Other Ambulatory Visit: Payer: Self-pay | Admitting: Internal Medicine

## 2019-04-28 DIAGNOSIS — I10 Essential (primary) hypertension: Secondary | ICD-10-CM

## 2019-04-28 DIAGNOSIS — R600 Localized edema: Secondary | ICD-10-CM

## 2019-04-28 NOTE — Telephone Encounter (Signed)
Pt is calling regarding medicine, pls contact [phone]

## 2019-04-28 NOTE — Telephone Encounter (Signed)
RTC, spoke with husband and informed him we just received refill request for add'l Lasix from pharmacy.  Per Dr. Jerrell Mylar note on 04/11/19, if pt continued to swell MD may consider holding amlodipine, husband states she is out of amlodipine X 1 week and has no swelling, took last lasix yesterday.  Husband worried her b/p may increase.  RN informed husband will send message to PCP to advise on amlodipine and lasix. SChaplin, RN,BSN

## 2019-05-01 MED ORDER — HYDROCHLOROTHIAZIDE 12.5 MG PO CAPS: 12.5000 mg | ORAL_CAPSULE | Freq: Every day | ORAL | 3 refills | Status: DC

## 2019-05-01 NOTE — Telephone Encounter (Signed)
Patient seen for LE swelling. Difficult to determine if this was secondary to the amlodipine vs immobility. Her immobility has improved. We will discontinue lasix and amlodipine and start her on HCTZ 12.5 mg QD. She will return in 4-6 weeks for a BMP.   Thank you,  Ina Homes

## 2019-05-03 ENCOUNTER — Other Ambulatory Visit: Payer: Self-pay | Admitting: Internal Medicine

## 2019-05-03 DIAGNOSIS — R600 Localized edema: Secondary | ICD-10-CM

## 2019-05-04 ENCOUNTER — Encounter: Payer: Medicare Other | Admitting: Internal Medicine

## 2019-05-25 ENCOUNTER — Encounter: Payer: Medicare Other | Admitting: Internal Medicine

## 2019-05-25 ENCOUNTER — Encounter: Payer: Self-pay | Admitting: Internal Medicine

## 2019-05-27 ENCOUNTER — Other Ambulatory Visit: Payer: Self-pay | Admitting: Internal Medicine

## 2019-12-11 ENCOUNTER — Encounter: Payer: Self-pay | Admitting: *Deleted

## 2020-01-17 ENCOUNTER — Inpatient Hospital Stay (HOSPITAL_COMMUNITY)
Admission: EM | Admit: 2020-01-17 | Discharge: 2020-01-20 | DRG: 194 | Disposition: A | Discharge status: Home / Self Care | Payer: Medicare Other | Attending: Internal Medicine | Admitting: Internal Medicine

## 2020-01-17 ENCOUNTER — Other Ambulatory Visit: Payer: Self-pay

## 2020-01-17 ENCOUNTER — Emergency Department (HOSPITAL_COMMUNITY): Payer: Medicare Other

## 2020-01-17 DIAGNOSIS — N179 Acute kidney failure, unspecified: Secondary | ICD-10-CM | POA: Diagnosis present

## 2020-01-17 DIAGNOSIS — J13 Pneumonia due to Streptococcus pneumoniae: Secondary | ICD-10-CM | POA: Diagnosis not present

## 2020-01-17 DIAGNOSIS — Z885 Allergy status to narcotic agent status: Secondary | ICD-10-CM

## 2020-01-17 DIAGNOSIS — Z8673 Personal history of transient ischemic attack (TIA), and cerebral infarction without residual deficits: Secondary | ICD-10-CM

## 2020-01-17 DIAGNOSIS — Z8711 Personal history of peptic ulcer disease: Secondary | ICD-10-CM

## 2020-01-17 DIAGNOSIS — N1831 Chronic kidney disease, stage 3a: Secondary | ICD-10-CM | POA: Diagnosis present

## 2020-01-17 DIAGNOSIS — E860 Dehydration: Secondary | ICD-10-CM | POA: Diagnosis present

## 2020-01-17 DIAGNOSIS — Z833 Family history of diabetes mellitus: Secondary | ICD-10-CM

## 2020-01-17 DIAGNOSIS — Z888 Allergy status to other drugs, medicaments and biological substances status: Secondary | ICD-10-CM

## 2020-01-17 DIAGNOSIS — I129 Hypertensive chronic kidney disease with stage 1 through stage 4 chronic kidney disease, or unspecified chronic kidney disease: Secondary | ICD-10-CM | POA: Diagnosis present

## 2020-01-17 DIAGNOSIS — Z982 Presence of cerebrospinal fluid drainage device: Secondary | ICD-10-CM

## 2020-01-17 DIAGNOSIS — Z20822 Contact with and (suspected) exposure to covid-19: Secondary | ICD-10-CM | POA: Diagnosis present

## 2020-01-17 DIAGNOSIS — I252 Old myocardial infarction: Secondary | ICD-10-CM

## 2020-01-17 DIAGNOSIS — Z79899 Other long term (current) drug therapy: Secondary | ICD-10-CM

## 2020-01-17 DIAGNOSIS — I2510 Atherosclerotic heart disease of native coronary artery without angina pectoris: Secondary | ICD-10-CM | POA: Diagnosis present

## 2020-01-17 DIAGNOSIS — G919 Hydrocephalus, unspecified: Secondary | ICD-10-CM | POA: Diagnosis present

## 2020-01-17 DIAGNOSIS — Z825 Family history of asthma and other chronic lower respiratory diseases: Secondary | ICD-10-CM

## 2020-01-17 DIAGNOSIS — N1832 Chronic kidney disease, stage 3b: Secondary | ICD-10-CM | POA: Diagnosis present

## 2020-01-17 DIAGNOSIS — D539 Nutritional anemia, unspecified: Secondary | ICD-10-CM | POA: Diagnosis present

## 2020-01-17 DIAGNOSIS — F4321 Adjustment disorder with depressed mood: Secondary | ICD-10-CM | POA: Diagnosis present

## 2020-01-17 LAB — CBC
HCT: 39.6 % (ref 36.0–46.0)
Hemoglobin: 11.4 g/dL — ABNORMAL LOW (ref 12.0–15.0)
MCH: 34.4 pg — ABNORMAL HIGH (ref 26.0–34.0)
MCHC: 28.8 g/dL — ABNORMAL LOW (ref 30.0–36.0)
MCV: 119.6 fL — ABNORMAL HIGH (ref 80.0–100.0)
Platelets: 232 10*3/uL (ref 150–400)
RBC: 3.31 MIL/uL — ABNORMAL LOW (ref 3.87–5.11)
RDW: 15.8 % — ABNORMAL HIGH (ref 11.5–15.5)
WBC: 11.1 10*3/uL — ABNORMAL HIGH (ref 4.0–10.5)
nRBC: 0 % (ref 0.0–0.2)

## 2020-01-17 LAB — BASIC METABOLIC PANEL
Anion gap: 14 (ref 5–15)
BUN: 45 mg/dL — ABNORMAL HIGH (ref 8–23)
CO2: 12 mmol/L — ABNORMAL LOW (ref 22–32)
Calcium: 9.5 mg/dL (ref 8.9–10.3)
Chloride: 111 mmol/L (ref 98–111)
Creatinine, Ser: 2.32 mg/dL — ABNORMAL HIGH (ref 0.44–1.00)
GFR calc Af Amer: 24 mL/min — ABNORMAL LOW (ref >60)
GFR calc non Af Amer: 20 mL/min — ABNORMAL LOW (ref >60)
Glucose, Bld: 171 mg/dL — ABNORMAL HIGH (ref 70–99)
Potassium: 4.5 mmol/L (ref 3.5–5.1)
Sodium: 137 mmol/L (ref 135–145)

## 2020-01-17 MED ORDER — ALBUTEROL SULFATE (2.5 MG/3ML) 0.083% IN NEBU
2.5000 mg | INHALATION_SOLUTION | Freq: Once | RESPIRATORY_TRACT | Status: AC
  Administered 2020-01-17: 2.5 mg via RESPIRATORY_TRACT
  Filled 2020-01-17: qty 3

## 2020-01-17 MED ORDER — SODIUM CHLORIDE 0.9 % IV SOLN: Freq: Once | INTRAVENOUS | Status: AC

## 2020-01-17 MED ORDER — ONDANSETRON HCL 4 MG/2ML IJ SOLN
4.0000 mg | Freq: Once | INTRAMUSCULAR | Status: AC
  Administered 2020-01-17: 4 mg via INTRAVENOUS
  Filled 2020-01-17: qty 2

## 2020-01-17 MED ORDER — FENTANYL CITRATE (PF) 100 MCG/2ML IJ SOLN
25.0000 ug | Freq: Once | INTRAMUSCULAR | Status: AC
  Administered 2020-01-17: 25 ug via INTRAVENOUS
  Filled 2020-01-17: qty 2

## 2020-01-17 NOTE — ED Notes (Signed)
Pt daughter came inside to take patient home AMA but pt became short of breath while ambulating.  Pt and daughter have decided to stay.

## 2020-01-17 NOTE — ED Triage Notes (Signed)
Pt was sent here by urgent care for further eval of cough and exertional shortness of breath x 3 days. Denies sick contacts. Hx asthma.

## 2020-01-17 NOTE — ED Provider Notes (Addendum)
[facility] EMERGENCY DEPARTMENT Provider Note   CSN: [id number] Arrival date & time: 01/17/20  1518     History Chief Complaint  Patient presents with  . Cough  . Shortness of Breath    Kristen Campbell is a 74 y.o. female.  Patient reports nonproductive cough x4 days that has progressively gotten worse. Endorses post nasal drainage and recently has been around grandchildren who also have cold and runny nose.  Denies any fevers, anosmia, ageusia.  She reports having posttussis chest pain that is reproducible.  Worsening SOB.  Had taken some Nyquil without relief.  History of Asthma but has not used any inhalers in 6 years.        Past Medical History:  Diagnosis Date  . Brain aneurysm   . Depression   . Hypertension     Patient Active Problem List   Diagnosis Date Noted  . Bilateral leg edema 04/07/2019  . Pyloric ulcer 03/12/2019  . Essential hypertension 03/12/2019  . Caregiver with fatigue 09/09/2014  . Adjustment disorder with depressed mood 09/09/2014    Past Surgical History:  Procedure Laterality Date  . ABDOMINAL SURGERY     fluid tumor removal  . ABDOMINAL SURGERY     bleeding ulcers  . BALLOON DILATION N/A 03/12/2019   Procedure: BALLOON DILATION;  Surgeon: Kerin Salen, MD;  Location: [facility] ENDOSCOPY;  Service: Gastroenterology;  Laterality: N/A;  . BIOPSY  03/12/2019   Procedure: BIOPSY;  Surgeon: Kerin Salen, MD;  Location: [facility] ENDOSCOPY;  Service: Gastroenterology;;  . CEREBRAL ANEURYSM REPAIR    . CSF SHUNT    . ESOPHAGOGASTRODUODENOSCOPY (EGD) WITH PROPOFOL N/A 03/12/2019   Procedure: ESOPHAGOGASTRODUODENOSCOPY (EGD) WITH PROPOFOL;  Surgeon: Kerin Salen, MD;  Location: [facility] ENDOSCOPY;  Service: Gastroenterology;  Laterality: N/A;  . ROTATOR CUFF REPAIR       OB History   No obstetric history on file.     No family history on file.  Social History   Tobacco Use  . Smoking status: Never Smoker  . Smokeless tobacco: Never Used    Substance Use Topics  . Alcohol use: No  . Drug use: No    Home Medications Prior to Admission medications   Medication Sig Start Date End Date Taking? Authorizing Provider  cyclobenzaprine (FLEXERIL) 10 MG tablet Take 10 mg by mouth in the morning and at bedtime.   Yes [provider]  DULoxetine (CYMBALTA) 60 MG capsule Take 60 mg by mouth every morning.  08/09/14  Yes [provider]  hydrochlorothiazide (MICROZIDE) 12.5 MG capsule Take 1 capsule (12.5 mg total) by mouth daily. 05/01/19  Yes Helberg, Jill Alexanders, MD  nebivolol (BYSTOLIC) 10 MG tablet Take 10 mg by mouth every morning.   Yes [provider]  pantoprazole (PROTONIX) 40 MG tablet TAKE 1 TABLET BY MOUTH 2 TIMES DAILY BEFORE A MEAL Patient taking differently: Take 40 mg by mouth 2 (two) times daily before a meal.  05/29/19  Yes Helberg, Justin, MD    Allergies    Carbamazepine, Potassium chloride, Ibuprofen, Codeine, and Dilantin [phenytoin]  Review of Systems   Review of Systems  Constitutional: Negative for chills and fever.  HENT: Positive for postnasal drip. Negative for congestion, rhinorrhea, sore throat and trouble swallowing.   Respiratory: Positive for cough and shortness of breath. Negative for chest tightness.   Cardiovascular: Positive for chest pain. Negative for leg swelling.  Gastrointestinal: Positive for nausea. Negative for abdominal pain, constipation, diarrhea and vomiting.    Physical Exam  Updated Vital Signs BP (!) 116/52 (BP Location: Right Arm)   Pulse 81   Temp 98.4 F (36.9 C) (Oral)   Resp 20   SpO2 97%   Physical Exam Cardiovascular:     Rate and Rhythm: Normal rate and regular rhythm.  Pulmonary:     Effort: Tachypnea present.     Breath sounds: Examination of the right-lower field reveals wheezing. Examination of the left-lower field reveals wheezing. Wheezing present.  Chest:     Chest wall: Tenderness present. No edema.  Musculoskeletal:     Cervical  back: Normal range of motion and neck supple.     Right lower leg: No edema.     Left lower leg: No edema.  Skin:    General: Skin is warm and dry.     Capillary Refill: Capillary refill takes less than 2 seconds.  Neurological:     Mental Status: She is alert.     ED Results / Procedures / Treatments   Labs (all labs ordered are listed, but only abnormal results are displayed) Labs Reviewed  CBC - Abnormal; Notable for the following components:      Result Value   WBC 11.1 (*)    RBC 3.31 (*)    Hemoglobin 11.4 (*)    MCV 119.6 (*)    MCH 34.4 (*)    MCHC 28.8 (*)    RDW 15.8 (*)    All other components within normal limits  BASIC METABOLIC PANEL - Abnormal; Notable for the following components:   CO2 12 (*)    Glucose, Bld 171 (*)    BUN 45 (*)    Creatinine, Ser 2.32 (*)    GFR calc non Af Amer 20 (*)    GFR calc Af Amer 24 (*)    All other components within normal limits  BRAIN NATRIURETIC PEPTIDE  D-DIMER, QUANTITATIVE (NOT AT [facility])  TROPONIN I (HIGH SENSITIVITY)    EKG EKG Interpretation  Date/Time:  Wednesday January 17 2020 22:46:29 EDT Ventricular Rate:  114 PR Interval:  168 QRS Duration: 95 QT Interval:  353 QTC Calculation: 487 R Axis:   68 Text Interpretation: Sinus tachycardia Anteroseptal infarct, old Repol abnrm, severe global ischemia (LM/MVD) Confirmed by Kennis Carina [phone]) on 01/17/2020 10:51:47 PM   Radiology DG Chest 2 View  Result Date: 01/17/2020 CLINICAL DATA:  Cough with shortness of breath x3 days. EXAM: CHEST - 2 VIEW COMPARISON:  March 12, 2019 FINDINGS: There is no evidence of acute infiltrate, pleural effusion or pneumothorax. Radiopaque tubing from a ventriculoperitoneal shunt is again seen. Multiple radiopaque surgical clips are seen overlying the expected region of the gastroesophageal junction. The heart size and mediastinal contours are within normal limits. A radiopaque fusion plate and screws are seen overlying the lower  cervical spine. Multilevel degenerative changes seen throughout the thoracic spine. IMPRESSION: No active cardiopulmonary disease. Electronically Signed   By: Aram Candela M.D.   On: 01/17/2020 17:14    Procedures Procedures (including critical care time)  Medications Ordered in ED Medications  0.9 %  sodium chloride infusion (has no administration in time range)  ondansetron (ZOFRAN) injection 4 mg (4 mg Intravenous Given 01/17/20 2315)  albuterol (PROVENTIL) (2.5 MG/3ML) 0.083% nebulizer solution 2.5 mg (2.5 mg Nebulization Given 01/17/20 2134)  fentaNYL (SUBLIMAZE) injection 25 mcg (25 mcg Intravenous Given 01/17/20 2313)    ED Course  I have reviewed the triage vital signs and the nursing notes.  Pertinent labs & imaging results that were  available during my care of the patient were reviewed by me and considered in my medical decision making (see chart for details).  Chest pain likely posttussis given that pain reproducible. SOB likely secondary to viral bronchitis given recent contact with sick contacts, afebrile and history of asthma. EKG shows no STEMI.  Labs significant for NAGMA.  Will consult nephrology for evaluation, recommend Bicarb 600mg  BID and follow up with PCP.    Patient developed tachycardia. Repeat ECG shows tachycardia with possible global ischemia.  Troponin pending.  Will consults cardiology for evaluation.  Given change in patient condition I think admission is warranted pending complete workup.  Reports cough and SOB slightly better since albuterol treatment.  Sign off to oncoming team.  Clinical Course as of Jan 17 2316  Wed Jan 17, 2020  2033 ED EKG [TW]    Clinical Course User Index [TW] Jan 19, 2020, MD   MDM Rules/Calculators/A&P                           Final Clinical Impression(s) / ED Diagnoses Final diagnoses:  None    Rx / DC Orders ED Discharge Orders    None       Dana Allan, MD 01/17/20 2354    03/18/20, MD 01/17/20  2354    03/18/20, MD 01/20/20 1104

## 2020-01-18 ENCOUNTER — Encounter (HOSPITAL_COMMUNITY): Payer: Self-pay | Admitting: Student in an Organized Health Care Education/Training Program

## 2020-01-18 ENCOUNTER — Observation Stay (HOSPITAL_COMMUNITY): Payer: Medicare Other

## 2020-01-18 ENCOUNTER — Emergency Department (HOSPITAL_COMMUNITY): Payer: Medicare Other

## 2020-01-18 DIAGNOSIS — N179 Acute kidney failure, unspecified: Secondary | ICD-10-CM | POA: Diagnosis present

## 2020-01-18 DIAGNOSIS — G919 Hydrocephalus, unspecified: Secondary | ICD-10-CM | POA: Diagnosis present

## 2020-01-18 DIAGNOSIS — I2510 Atherosclerotic heart disease of native coronary artery without angina pectoris: Secondary | ICD-10-CM | POA: Diagnosis present

## 2020-01-18 DIAGNOSIS — Z825 Family history of asthma and other chronic lower respiratory diseases: Secondary | ICD-10-CM | POA: Diagnosis not present

## 2020-01-18 DIAGNOSIS — F4321 Adjustment disorder with depressed mood: Secondary | ICD-10-CM | POA: Diagnosis present

## 2020-01-18 DIAGNOSIS — I252 Old myocardial infarction: Secondary | ICD-10-CM | POA: Diagnosis not present

## 2020-01-18 DIAGNOSIS — Z982 Presence of cerebrospinal fluid drainage device: Secondary | ICD-10-CM | POA: Diagnosis not present

## 2020-01-18 DIAGNOSIS — J13 Pneumonia due to Streptococcus pneumoniae: Secondary | ICD-10-CM | POA: Diagnosis present

## 2020-01-18 DIAGNOSIS — Z833 Family history of diabetes mellitus: Secondary | ICD-10-CM | POA: Diagnosis not present

## 2020-01-18 DIAGNOSIS — Z8711 Personal history of peptic ulcer disease: Secondary | ICD-10-CM | POA: Diagnosis not present

## 2020-01-18 DIAGNOSIS — R0600 Dyspnea, unspecified: Secondary | ICD-10-CM | POA: Diagnosis not present

## 2020-01-18 DIAGNOSIS — Z79899 Other long term (current) drug therapy: Secondary | ICD-10-CM | POA: Diagnosis not present

## 2020-01-18 DIAGNOSIS — D539 Nutritional anemia, unspecified: Secondary | ICD-10-CM | POA: Diagnosis present

## 2020-01-18 DIAGNOSIS — I129 Hypertensive chronic kidney disease with stage 1 through stage 4 chronic kidney disease, or unspecified chronic kidney disease: Secondary | ICD-10-CM | POA: Diagnosis present

## 2020-01-18 DIAGNOSIS — Z888 Allergy status to other drugs, medicaments and biological substances status: Secondary | ICD-10-CM | POA: Diagnosis not present

## 2020-01-18 DIAGNOSIS — Z885 Allergy status to narcotic agent status: Secondary | ICD-10-CM | POA: Diagnosis not present

## 2020-01-18 DIAGNOSIS — N1832 Chronic kidney disease, stage 3b: Secondary | ICD-10-CM | POA: Diagnosis present

## 2020-01-18 DIAGNOSIS — E860 Dehydration: Secondary | ICD-10-CM | POA: Diagnosis present

## 2020-01-18 DIAGNOSIS — N1831 Chronic kidney disease, stage 3a: Secondary | ICD-10-CM | POA: Diagnosis present

## 2020-01-18 DIAGNOSIS — Z20822 Contact with and (suspected) exposure to covid-19: Secondary | ICD-10-CM | POA: Diagnosis present

## 2020-01-18 DIAGNOSIS — Z8673 Personal history of transient ischemic attack (TIA), and cerebral infarction without residual deficits: Secondary | ICD-10-CM | POA: Diagnosis not present

## 2020-01-18 HISTORY — DX: Pneumonia due to Streptococcus pneumoniae: J13

## 2020-01-18 LAB — D-DIMER, QUANTITATIVE: D-Dimer, Quant: 1.02 ug/mL-FEU — ABNORMAL HIGH (ref 0.00–0.50)

## 2020-01-18 LAB — TROPONIN I (HIGH SENSITIVITY)
Troponin I (High Sensitivity): 11 ng/L (ref <18)
Troponin I (High Sensitivity): 14 ng/L (ref <18)

## 2020-01-18 LAB — MAGNESIUM: Magnesium: 1.7 mg/dL (ref 1.7–2.4)

## 2020-01-18 LAB — COMPREHENSIVE METABOLIC PANEL
ALT: 24 U/L (ref 0–44)
AST: 30 U/L (ref 15–41)
Albumin: 3.7 g/dL (ref 3.5–5.0)
Alkaline Phosphatase: 64 U/L (ref 38–126)
Anion gap: 13 (ref 5–15)
BUN: 48 mg/dL — ABNORMAL HIGH (ref 8–23)
CO2: 14 mmol/L — ABNORMAL LOW (ref 22–32)
Calcium: 9.5 mg/dL (ref 8.9–10.3)
Chloride: 110 mmol/L (ref 98–111)
Creatinine, Ser: 2.18 mg/dL — ABNORMAL HIGH (ref 0.44–1.00)
GFR calc Af Amer: 26 mL/min — ABNORMAL LOW (ref >60)
GFR calc non Af Amer: 22 mL/min — ABNORMAL LOW (ref >60)
Glucose, Bld: 134 mg/dL — ABNORMAL HIGH (ref 70–99)
Potassium: 4.4 mmol/L (ref 3.5–5.1)
Sodium: 137 mmol/L (ref 135–145)
Total Bilirubin: 0.5 mg/dL (ref 0.3–1.2)
Total Protein: 7.4 g/dL (ref 6.5–8.1)

## 2020-01-18 LAB — VITAMIN B12: Vitamin B-12: 734 pg/mL (ref 180–914)

## 2020-01-18 LAB — URINALYSIS, ROUTINE W REFLEX MICROSCOPIC
Bilirubin Urine: NEGATIVE
Glucose, UA: NEGATIVE mg/dL
Ketones, ur: NEGATIVE mg/dL
Nitrite: POSITIVE — AB
Protein, ur: NEGATIVE mg/dL
Specific Gravity, Urine: 1.02 (ref 1.005–1.030)
pH: 5 (ref 5.0–8.0)

## 2020-01-18 LAB — PROTEIN / CREATININE RATIO, URINE
Creatinine, Urine: 107.48 mg/dL
Protein Creatinine Ratio: 0.41 mg/mg{Cre} — ABNORMAL HIGH (ref 0.00–0.15)
Total Protein, Urine: 44 mg/dL

## 2020-01-18 LAB — CBC WITH DIFFERENTIAL/PLATELET
Abs Immature Granulocytes: 0 10*3/uL (ref 0.00–0.07)
Basophils Absolute: 0.3 10*3/uL — ABNORMAL HIGH (ref 0.0–0.1)
Basophils Relative: 2 %
Eosinophils Absolute: 0 10*3/uL (ref 0.0–0.5)
Eosinophils Relative: 0 %
HCT: 32.2 % — ABNORMAL LOW (ref 36.0–46.0)
Hemoglobin: 10.3 g/dL — ABNORMAL LOW (ref 12.0–15.0)
Lymphocytes Relative: 10 %
Lymphs Abs: 1.6 10*3/uL (ref 0.7–4.0)
MCH: 34.7 pg — ABNORMAL HIGH (ref 26.0–34.0)
MCHC: 32 g/dL (ref 30.0–36.0)
MCV: 108.4 fL — ABNORMAL HIGH (ref 80.0–100.0)
Monocytes Absolute: 0.8 10*3/uL (ref 0.1–1.0)
Monocytes Relative: 5 %
Neutro Abs: 13.1 10*3/uL — ABNORMAL HIGH (ref 1.7–7.7)
Neutrophils Relative %: 83 %
Platelets: 255 10*3/uL (ref 150–400)
RBC: 2.97 MIL/uL — ABNORMAL LOW (ref 3.87–5.11)
RDW: 15.4 % (ref 11.5–15.5)
WBC: 15.8 10*3/uL — ABNORMAL HIGH (ref 4.0–10.5)
nRBC: 0 % (ref 0.0–0.2)
nRBC: 0 /100 WBC

## 2020-01-18 LAB — BRAIN NATRIURETIC PEPTIDE: B Natriuretic Peptide: 155.6 pg/mL — ABNORMAL HIGH (ref 0.0–100.0)

## 2020-01-18 LAB — CK: Total CK: 245 U/L — ABNORMAL HIGH (ref 38–234)

## 2020-01-18 LAB — STREP PNEUMONIAE URINARY ANTIGEN: Strep Pneumo Urinary Antigen: POSITIVE — AB

## 2020-01-18 LAB — SARS CORONAVIRUS 2 BY RT PCR (HOSPITAL ORDER, PERFORMED IN ~~LOC~~ HOSPITAL LAB): SARS Coronavirus 2: NEGATIVE

## 2020-01-18 MED ORDER — ACETAMINOPHEN 650 MG RE SUPP: 650.0000 mg | Freq: Four times a day (QID) | RECTAL | Status: DC | PRN

## 2020-01-18 MED ORDER — LEVOFLOXACIN 500 MG PO TABS
250.0000 mg | ORAL_TABLET | ORAL | Status: DC
  Administered 2020-01-20: 250 mg via ORAL
  Filled 2020-01-18: qty 1

## 2020-01-18 MED ORDER — ACETAMINOPHEN 325 MG PO TABS
650.0000 mg | ORAL_TABLET | Freq: Four times a day (QID) | ORAL | Status: DC | PRN
  Administered 2020-01-18 – 2020-01-20 (×4): 650 mg via ORAL
  Filled 2020-01-18 (×4): qty 2

## 2020-01-18 MED ORDER — LACTATED RINGERS IV SOLN: INTRAVENOUS | Status: AC

## 2020-01-18 MED ORDER — ALBUTEROL SULFATE (2.5 MG/3ML) 0.083% IN NEBU
2.5000 mg | INHALATION_SOLUTION | RESPIRATORY_TRACT | Status: DC | PRN
  Administered 2020-01-18 – 2020-01-19 (×4): 2.5 mg via RESPIRATORY_TRACT
  Filled 2020-01-18 (×4): qty 3

## 2020-01-18 MED ORDER — AMOXICILLIN 500 MG PO CAPS
500.0000 mg | ORAL_CAPSULE | Freq: Two times a day (BID) | ORAL | Status: DC
  Administered 2020-01-18: 500 mg via ORAL
  Filled 2020-01-18: qty 1

## 2020-01-18 MED ORDER — LIDOCAINE 5 % EX PTCH
1.0000 | MEDICATED_PATCH | CUTANEOUS | Status: DC
  Administered 2020-01-18 – 2020-01-19 (×2): 1 via TRANSDERMAL
  Filled 2020-01-18 (×2): qty 1

## 2020-01-18 MED ORDER — NEBIVOLOL HCL 5 MG PO TABS
10.0000 mg | ORAL_TABLET | Freq: Every morning | ORAL | Status: DC
  Administered 2020-01-18 – 2020-01-20 (×3): 10 mg via ORAL
  Filled 2020-01-18: qty 2
  Filled 2020-01-18: qty 1
  Filled 2020-01-18: qty 2

## 2020-01-18 MED ORDER — HEPARIN SODIUM (PORCINE) 5000 UNIT/ML IJ SOLN
5000.0000 [IU] | Freq: Three times a day (TID) | INTRAMUSCULAR | Status: DC
  Administered 2020-01-18 – 2020-01-20 (×7): 5000 [IU] via SUBCUTANEOUS
  Filled 2020-01-18 (×7): qty 1

## 2020-01-18 MED ORDER — ALBUTEROL SULFATE (2.5 MG/3ML) 0.083% IN NEBU
2.5000 mg | INHALATION_SOLUTION | Freq: Four times a day (QID) | RESPIRATORY_TRACT | Status: DC
  Administered 2020-01-18 (×2): 2.5 mg via RESPIRATORY_TRACT
  Filled 2020-01-18 (×2): qty 3

## 2020-01-18 MED ORDER — HEPARIN BOLUS VIA INFUSION
4000.0000 [IU] | Freq: Once | INTRAVENOUS | Status: AC
  Administered 2020-01-18: 4000 [IU] via INTRAVENOUS
  Filled 2020-01-18: qty 4000

## 2020-01-18 MED ORDER — BENZONATATE 100 MG PO CAPS
200.0000 mg | ORAL_CAPSULE | Freq: Two times a day (BID) | ORAL | Status: DC
  Administered 2020-01-18 – 2020-01-20 (×5): 200 mg via ORAL
  Filled 2020-01-18 (×5): qty 2

## 2020-01-18 MED ORDER — TECHNETIUM TO 99M ALBUMIN AGGREGATED
4.2000 | Freq: Once | INTRAVENOUS | Status: AC | PRN
  Administered 2020-01-18: 4.2 via INTRAVENOUS

## 2020-01-18 MED ORDER — GUAIFENESIN-DM 100-10 MG/5ML PO SYRP
5.0000 mL | ORAL_SOLUTION | Freq: Once | ORAL | Status: AC | PRN
  Administered 2020-01-19: 5 mL via ORAL
  Filled 2020-01-18: qty 5

## 2020-01-18 MED ORDER — HEPARIN (PORCINE) 25000 UT/250ML-% IV SOLN
1000.0000 [IU]/h | INTRAVENOUS | Status: DC
  Administered 2020-01-18: 1000 [IU]/h via INTRAVENOUS
  Filled 2020-01-18: qty 250

## 2020-01-18 MED ORDER — LEVOFLOXACIN 500 MG PO TABS
500.0000 mg | ORAL_TABLET | Freq: Once | ORAL | Status: AC
  Administered 2020-01-18: 500 mg via ORAL
  Filled 2020-01-18: qty 1

## 2020-01-18 MED ORDER — DULOXETINE HCL 60 MG PO CPEP
60.0000 mg | ORAL_CAPSULE | Freq: Every morning | ORAL | Status: DC
  Administered 2020-01-18 – 2020-01-20 (×3): 60 mg via ORAL
  Filled 2020-01-18 (×3): qty 1

## 2020-01-18 NOTE — ED Notes (Signed)
Pulse ox while ambulating to restroom with no assistance 98%

## 2020-01-18 NOTE — Hospital Course (Addendum)
-  feels sick -chest wall pain -coughing -feels okay walking, but hurts chest -DOE, feels like she can only walk to bathroom -requested cough meds, pain meds  -had pna 30 yrs ago, feels similar

## 2020-01-18 NOTE — ED Notes (Signed)
Breakfast ordered 

## 2020-01-18 NOTE — Progress Notes (Addendum)
   Subjective:  Kristen Campbell is a 74 y.o. with PMH of asthma, CVA, CAD, MI, HTN, PUD admit for cough on hospital day 0  Kristen Campbell was examined and evaluated at bedside this am. She was noted to be coughing. She mentions having improvement in her cough and nausea and was able to tolerate diet his am. She mentions that her right side of her chest is sore from repeated coughing. She mentions that she is only able to walk few steps before feeling dyspneic. At baseline, she can walk down her hall at home. She denies any history of NSAID use. Mentions having a 4 hours drive recently on a trip with her grandkids.  Objective:  Vital signs in last 24 hours: Vitals:   01/18/20 0401 01/18/20 0407 01/18/20 0700 01/18/20 0903  BP:   (!) 102/56   Pulse: 98  89   Resp:      Temp:      TempSrc:      SpO2: 97% 99% 94% 94%  Weight:      Height:       Gen: Well-developed, well nourished, NAD HEENT: NCAT head, hearing intact, MMM Pulm: Breathing comfortably on room air, no distress  Extm: ROM intact, No peripheral edema, no calf tenderness Skin: Dry, Warm, normal turgor Psych: Normal mood and affect   Assessment/Plan:  Active Problems:   AKI (acute kidney injury) (HCC)  Kristen Campbell is a 74 y.o. with PMH of asthma, CVA, CAD, MI, HTN, PUD admit for rhinorrhea and wheezing  Dyspnea 2/2 Pneumococcal Pneumonia Admit w/ 4 day hx of non-productive cough and shortness of breath after sick contact. Strep antigen positive in urine. COVID negative. D-dimer elevated at 1.02 w/ hx of recent travel. Her symptoms likely due to strep pneumo. Currently on room air. VQ scan low risk for PE. - Start levofloxacin given local resistance pattern to penicillin - Keep O2 sat >90  Acute Kidney Injury On Chronic Kidney Disease stage 3a On admission, noted to have AKI (Bun 45, Creatinine 2.32) on CKD (BUN 15, Creatinine 1.12 04/06/19). Has hx of CKD stage 3a. Likely aki due pre-renal azotemia due to dehydration  in setting of acute infection. UA w/o proteinuria , minimal rbc. Nitrites/Leuks + w/o dysuria. Renal ultrasound w/ parenchymal atrophy w/o acute hydronephrosis. Creatinine not yet return to baseline (creatinie 2.32->2.18) - C/w LR 125 cc/hr - Trend nephrotoxic meds - Trend renal fx - Follow up CK and ASO titers  Chronic Macrocytic Anemia Hemoglobin of 11.4 on admission. Trending down w/ fluid. Chart review shows chronic anemia w/ hemoglobin at baseline 8-10. MCV elevated at 110.  - Trend cbc - F/u vitmain B12, MMA  HTN Am bp 125/57 - C/w home meds: nebivolol 10mg   DVT prophx: subqhep Diet: Regular Bowel: N/A Code: Full  Prior to Admission Living Arrangement: Home Anticipated Discharge Location: Home Barriers to Discharge: Medical treatment Dispo: Anticipated discharge in approximately 0-1 day(s).   , MD 01/18/2020, 11:06 AM Pager: [phone] After 5pm on weekdays and 1pm on weekends: On Call Pager: [phone]

## 2020-01-18 NOTE — ED Notes (Signed)
Lunch Tray Ordered @ 1049. °

## 2020-01-18 NOTE — H&P (Addendum)
Date: 01/18/2020               Patient Name:  Kristen Campbell MRN: [id number]  DOB: Mar 02, 1946 Age / Sex: 74 y.o., female   PCP: Belva Agee, MD         Medical Service: Internal Medicine Teaching Service         Attending Physician: Dr. Oswaldo Done, Marquita Palms, *    First Contact: Dr. Renaldo Fiddler Pager: [phone]  Second Contact: Dr. Nedra Hai Pager: [phone]       After Hours (After 5p/  First Contact Pager: [phone]  weekends / holidays): Second Contact Pager: [phone]   Chief Complaint: Cough  History of Present Illness:   Kristen Campbell is a 74 y.o. F w/ PMHx asthma in childhood, CVA and MI in July 2016 s/p right VP shunt placement for hydrocephalus, essential HTN, PUD, and adjustment disorder with depressed mood presenting with 4 days of constant nonproductive cough associated with SOB both at rest and with exertion. She states her grand-daughters developed runny nose and cough 6 days ago. The patient's symptoms began with a runny nose, but that has resolved, and her cough is her biggest complaint. She endorses sore throat, hoarseness, and sore ribs secondary to cough. She also endorses chills and says her temperature was as high as 101 degrees over the weekend. She endorses palpitations only when walking and states she would only be able to walk a few steps without having to stop. Her cough and breathing significantly improved after albuterol in the ED. She endorses months of burning pain in her bilateral lower extremities at night as well as tingling that runs from her right toe up her leg and down her right arm and endorses a pressure sensation on the right side of her abdomen, but denies any leg swelling, leg pain, or CP. She denies any dysuria or difficulty urinating. She says that despite her cough she has been drinking plenty of water at home. No rashes or seasonal allergies.    Social Hx:  She has never smoked. She does not drink. She lives with her oldest daughter in  [place] as her family did not want her to live by herself. Her husband passed away about 1 year ago and she misses him quite a bit.    Meds:  Current Meds  Medication Sig   cyclobenzaprine (FLEXERIL) 10 MG tablet Take 10 mg by mouth in the morning and at bedtime.   DULoxetine (CYMBALTA) 60 MG capsule Take 60 mg by mouth every morning.    hydrochlorothiazide (MICROZIDE) 12.5 MG capsule Take 1 capsule (12.5 mg total) by mouth daily.   nebivolol (BYSTOLIC) 10 MG tablet Take 10 mg by mouth every morning.   pantoprazole (PROTONIX) 40 MG tablet TAKE 1 TABLET BY MOUTH 2 TIMES DAILY BEFORE A MEAL (Patient taking differently: Take 40 mg by mouth 2 (two) times daily before a meal. )    Allergies: Allergies as of 01/17/2020 - Review Complete 01/17/2020  Allergen Reaction Noted   Carbamazepine Other (See Comments) 05/24/2012   Potassium chloride Itching 11/03/2012   Ibuprofen Itching 05/24/2012   Codeine Nausea And Vomiting 09/08/2014   Dilantin [phenytoin] Other (See Comments) 09/08/2014   Past Medical History:  Diagnosis Date   Brain aneurysm    Depression    Hypertension    Past Surgeries:  R VP shunt, placed 05/15/15 Cervical spine surgery  Back surgery x 3  Left rotator cuff repair   Family History: Patient's mother, maternal grandmother,  and aunt had asthma. Patient says everyone on her father's side had diabetes.   Review of Systems: A complete ROS was negative except as per HPI.   Physical Exam: Blood pressure (!) 102/56, pulse 89, temperature 98.4 F (36.9 C), temperature source Oral, resp. rate (!) 21, height 5' (1.524 m), weight 62.1 kg, SpO2 94 %. General: Patient appears well. No acute distress. Eyes: Sclera non-icteric. No conjunctival injection.  HENT: No nasal discharge. Respiratory: Mild rhonchi and scattered, end-expiratory wheezes heard bilaterally. Patient is mildly tachypneic without increased work of breathing.  Cardiovascular: Regular rate and  rhythm. No murmurs, rubs, or gallops. Heart sounds distant. No lower extremity edema. Abdominal: Soft and non-tender to palpation. Bowel sounds intact. No rebound or guarding. Skin: There is a healed vertical scar across the abdomen. Otherwise, no lesions. No rashes.  Psych: Patient became visibly upset speaking about the death of her husband. Pleasant and cooperative.   EKG: personally reviewed my interpretation is: 01/17/20 @ 22:46 - There are non-specific ST segment elevations in leads aVR and V1 and ST segment depressions similar to prior EKG from 03/13/2019. Sinus tachycardia at a rate of about 114bpm. Normal PR interval. 01/17/20 @ 16:43 - Normal sinus rhythm at a rate of about 70bpm. T wave inversions in I and aVR. Non-specific ST elevation in V1. No other ST elevations or depressions.   CXR: personally reviewed my interpretation is no active cardiopulmonary disease.  CT Head w/o contrast: Right trans-occipital ventricular shunt with tip in the anterior right lateral ventricle. No acute intracranial abnormalities.  Assessment & Plan by Problem:  Respiratory Viral Infection Patient presents with 4 days of non-productive cough, sore throat, and shortness of breath following rhinorrhea with multiple family members ill with similar symptoms. Possibly causing asthma exacerbation given hx childhood asthma and symptom improvement s/p albuterol nebulizer in the ED. Patient has mild expiratory wheezing and rhonchi on exam. D-dimer elevated to 1.02 although wells score 0. No prolonged periods of mobilization, recent malignancy, or signs/symptoms of DVT. She does have WBC 11.1, although CXR showed no consolidations concerning for PNA.  - Continue albuterol nebulizer 2.5mg  q 6hrs x 5 doses  - Benzonatate 200mg  BID  - Given elevated D-dimer, V/Q scan ordered - COVID RT PCR ordered  - If O2 sat < 94%, give 2L Point with goal > 92%   Macrocytic Anemia Hemoglobin 11.4, MCV 119.6. Patient denies history of  alcohol use. No known liver disease. She does have complaints of burning bilateral leg pain / tingling at night which could be 2/2 B12 deficiency.  - Will check B12 and methylmalonic acid levels  - Check repeat CBC   Acute Kidney Injury (Baseline Cr ~ 1.12) Creatinine 2.32, BUN 45, GFR 20. Patient states that she has been drinking plenty of fluids and denies any trouble making urine or dysuria. She is on HCTZ at home.  - Nephrology consulted - Renal ultrasound  - Check urine protein/Cr ratio - Check urea nitrogen  - Check CMP and Mg2+  - Strict intake and output, daily weights   Adjustment Disorder with Depressed Mood Patient lost her husband last year and has been struggling with depressed mood.  - Continue home Duloxetine 60mg    Essential HTN - Continue home nebivolol 10mg  daily   Diet: Regular DVT PPx: heparin 5000 U SQ q8hrs Code Status: Full code    Dispo: Admit patient to Observation with expected length of stay less than 2 midnights.  Signed: , MD 01/18/2020, 7:35 AM  Pager: [phone] After 5pm on weekdays and 1pm on weekends: On Call pager: [phone]

## 2020-01-18 NOTE — ED Notes (Signed)
Dinner Tray Ordered @ 1740. 

## 2020-01-18 NOTE — Progress Notes (Signed)
ANTICOAGULATION CONSULT NOTE - Initial Consult  Pharmacy Consult for Heparin Indication: r/o pulmonary embolus  Allergies  Allergen Reactions  . Carbamazepine Other (See Comments)    Hallucinations/ reaction to Tegretol   . Potassium Chloride Itching    Reaction to KlorCon  . Ibuprofen Itching  . Codeine Nausea And Vomiting  . Dilantin [Phenytoin] Other (See Comments)    MD said never to take it - unknown reaction    Patient Measurements: Height: 5' (152.4 cm) Weight: 62.1 kg (136 lb 14.5 oz) IBW/kg (Calculated) : 45.5   Vital Signs: Temp: 98.4 F (36.9 C) (08/04 2103) Temp Source: Oral (08/04 2103) BP: 115/57 (08/05 0000) Pulse Rate: 97 (08/05 0001)  Labs: Recent Labs    01/17/20 1710 01/17/20 2332 01/18/20 0121  HGB 11.4*  --   --   HCT 39.6  --   --   PLT 232  --   --   CREATININE 2.32*  --   --   TROPONINIHS  --  11 14    Estimated Creatinine Clearance: 18.3 mL/min (A) (by C-G formula based on SCr of 2.32 mg/dL (H)).   Medical History: Past Medical History:  Diagnosis Date  . Brain aneurysm   . Depression   . Hypertension     Medications:  See electronic med rec  Assessment: 74 y.o. F presents with CP. To begin heparin for r/o PE. No AC PTA. CBC ok on admission.  Goal of Therapy:  Heparin level 0.3-0.7 units/ml Monitor platelets by anticoagulation protocol: Yes   Plan:  Heparin IV 4000 units bolus Heparin gtt at 1000 units/hr Will f/u heparin level in 8 hours Daily heparin level and CBC  Christoper Fabian, PharmD, BCPS Please see amion for complete clinical pharmacist phone list 01/18/2020,2:28 AM

## 2020-01-18 NOTE — Plan of Care (Signed)
Discussed with patient plan of care for the evening, pain management and breathing treatments with cough medications with some teach back displayed.

## 2020-01-19 DIAGNOSIS — R0600 Dyspnea, unspecified: Secondary | ICD-10-CM

## 2020-01-19 DIAGNOSIS — D539 Nutritional anemia, unspecified: Secondary | ICD-10-CM

## 2020-01-19 DIAGNOSIS — D631 Anemia in chronic kidney disease: Secondary | ICD-10-CM

## 2020-01-19 DIAGNOSIS — I129 Hypertensive chronic kidney disease with stage 1 through stage 4 chronic kidney disease, or unspecified chronic kidney disease: Secondary | ICD-10-CM

## 2020-01-19 DIAGNOSIS — N179 Acute kidney failure, unspecified: Secondary | ICD-10-CM

## 2020-01-19 DIAGNOSIS — J13 Pneumonia due to Streptococcus pneumoniae: Principal | ICD-10-CM

## 2020-01-19 DIAGNOSIS — N1831 Chronic kidney disease, stage 3a: Secondary | ICD-10-CM

## 2020-01-19 LAB — CBC
HCT: 27.8 % — ABNORMAL LOW (ref 36.0–46.0)
Hemoglobin: 8.7 g/dL — ABNORMAL LOW (ref 12.0–15.0)
MCH: 33.2 pg (ref 26.0–34.0)
MCHC: 31.3 g/dL (ref 30.0–36.0)
MCV: 106.1 fL — ABNORMAL HIGH (ref 80.0–100.0)
Platelets: 251 10*3/uL (ref 150–400)
RBC: 2.62 MIL/uL — ABNORMAL LOW (ref 3.87–5.11)
RDW: 15.3 % (ref 11.5–15.5)
WBC: 10.9 10*3/uL — ABNORMAL HIGH (ref 4.0–10.5)
nRBC: 0 % (ref 0.0–0.2)

## 2020-01-19 LAB — BASIC METABOLIC PANEL
Anion gap: 10 (ref 5–15)
BUN: 41 mg/dL — ABNORMAL HIGH (ref 8–23)
CO2: 17 mmol/L — ABNORMAL LOW (ref 22–32)
Calcium: 9.1 mg/dL (ref 8.9–10.3)
Chloride: 109 mmol/L (ref 98–111)
Creatinine, Ser: 1.6 mg/dL — ABNORMAL HIGH (ref 0.44–1.00)
GFR calc Af Amer: 37 mL/min — ABNORMAL LOW (ref >60)
GFR calc non Af Amer: 32 mL/min — ABNORMAL LOW (ref >60)
Glucose, Bld: 94 mg/dL (ref 70–99)
Potassium: 4.7 mmol/L (ref 3.5–5.1)
Sodium: 136 mmol/L (ref 135–145)

## 2020-01-19 LAB — ANTISTREPTOLYSIN O TITER: ASO: 40 IU/mL (ref 0.0–200.0)

## 2020-01-19 LAB — UREA NITROGEN, URINE: Urea Nitrogen, Ur: 645 mg/dL

## 2020-01-19 MED ORDER — ACETAMINOPHEN-CODEINE #3 300-30 MG PO TABS: 0.5000 | ORAL_TABLET | Freq: Four times a day (QID) | ORAL | Status: DC | PRN

## 2020-01-19 MED ORDER — DM-GUAIFENESIN ER 30-600 MG PO TB12: 1.0000 | ORAL_TABLET | Freq: Two times a day (BID) | ORAL | Status: DC

## 2020-01-19 MED ORDER — ACETAMINOPHEN-CODEINE 120-12 MG/5ML PO SOLN
5.0000 mL | Freq: Four times a day (QID) | ORAL | Status: DC | PRN
  Administered 2020-01-19 (×2): 5 mL via ORAL
  Filled 2020-01-19 (×2): qty 5

## 2020-01-19 NOTE — Plan of Care (Signed)
Discussed with patient plan of care for the evening, pain management and medications with some teach back displayed 

## 2020-01-19 NOTE — Progress Notes (Signed)
   Subjective: Patient interviewed at bedside.  States she feels sick, has significant chest wall pain due to coughing. States can only walk a short distance before her chest hurts. States she gets dizzy on exertion  She requested cough meds, pain meds. States she had pneumonia 30 yrs ago and current symptoms feels similar.   Objective:  Vital signs in last 24 hours: Vitals:   01/19/20 0100 01/19/20 0200 01/19/20 0250 01/19/20 0254  BP:    (!) 158/53  Pulse:    85  Resp: 20 17  20   Temp:    98.7 F (37.1 C)  TempSrc:    Oral  SpO2:    98%  Weight:   59.9 kg   Height:       Physical Exam Vitals and nursing note reviewed.  Constitutional:      General: She is not in acute distress.    Appearance: She is well-developed and normal weight. She is not ill-appearing or toxic-appearing.  HENT:     Head: Normocephalic and atraumatic.  Pulmonary:     Effort: Pulmonary effort is normal. No respiratory distress.     Breath sounds: Wheezing present.  Neurological:     General: No focal deficit present.     Mental Status: She is alert. Mental status is at baseline.  Psychiatric:        Mood and Affect: Mood normal.        Behavior: Behavior normal.        Thought Content: Thought content normal.      Assessment/Plan:  Principal Problem:   Pneumococcal pneumonia (HCC) Active Problems:   AKI (acute kidney injury) (HCC)   Kristen Campbell is a 74 yr old female with PMHx of asthma, CVA, CAD, MI, HTN, PUD who was admitted for rhinorrhea and wheezing.   Dyspnea 2/2 Pneumococcal Pneumonia Currently on room air. VQ scan low risk for PE. Will have next dose of Levofloxacin tomorrow. Reports moderate rib pain with coughing. Called her room and she states that she only had a reaction to codeine when she was much younger. She states she has had it since with no problems. - Started Levofloxacin 500 mg yesterday for loading dose -Started acetaminophen-codeine (120-12 mg/52ml) 5 ml q6  PRN for moderate pain and coughing  - Able to keep O2 sat >90 on room air while talking   Acute Kidney Injury On Chronic Kidney Disease stage 3a On admission Creatinine was 2.32 and Bun was 45. Today Creatinine was 1.60 and BUN was 41, trending towards baseline of ~ 1.1-1.3. Renal ultrasound yesterday showed parenchymal atrophy w/o acute hydronephrosis.  -Have stopped LR 125 cc/hr -Trend nephrotoxic meds -Trend renal function -ASO titers were 40.0 -CK was mildly elevated at 245   Chronic Macrocytic Anemia Hemoglobin today is 8.7. Trending down w/ fluid. Chart review shows chronic anemia w/ hemoglobin at baseline 8-10. MCV elevated at 110.  - Trend cbc - F/u vitmain B12, MMA  HTN - Continue nebivolol 10mg    DVT prophx: subqhep Diet: Regular   Prior to Admission Living Arrangement: Home Anticipated Discharge Location: Home Barriers to Discharge: None Dispo: Anticipated discharge in approximately 2-3 day(s).   4m, MD 01/19/2020, 6:02 AM Pager: [phone] After 5pm on weekdays and 1pm on weekends: On Call pager [phone]

## 2020-01-20 MED ORDER — LEVOFLOXACIN 250 MG PO TABS: 250.0000 mg | ORAL_TABLET | ORAL | 0 refills | Status: DC

## 2020-01-20 MED ORDER — ACETAMINOPHEN-CODEINE 120-12 MG/5ML PO SOLN: 5.0000 mL | Freq: Four times a day (QID) | ORAL | 0 refills | Status: AC | PRN

## 2020-01-20 NOTE — Discharge Summary (Signed)
Name: Kristen Campbell MRN: [id number] DOB: 11/27/1945 74 y.o. PCP: Kristen Agee, MD  Date of Admission: 01/17/2020  4:31 PM Date of Discharge: 01/20/2020 Attending Physician: Kristen Campbell, *  Discharge Diagnosis: 1. Pneumococcal Pneumonia 2. AKI in CKD Stage IIIa  Discharge Medications: Allergies as of 01/20/2020      Reactions   Carbamazepine Other (See Comments)   Hallucinations/ reaction to Tegretol   Potassium Chloride Itching   Reaction to KlorCon   Ibuprofen Itching   Codeine Nausea And Vomiting   Dilantin [phenytoin] Other (See Comments)   MD said never to take it - unknown reaction      Medication List    TAKE these medications   acetaminophen-codeine 120-12 MG/5ML solution Take 5 mLs by mouth every 6 (six) hours as needed for up to 5 days for moderate pain (coughing).   cyclobenzaprine 10 MG tablet Commonly known as: FLEXERIL Take 10 mg by mouth in the morning and at bedtime.   DULoxetine 60 MG capsule Commonly known as: CYMBALTA Take 60 mg by mouth every morning.   hydrochlorothiazide 12.5 MG capsule Commonly known as: MICROZIDE Take 1 capsule (12.5 mg total) by mouth daily.   levofloxacin 250 MG tablet Commonly known as: LEVAQUIN Take 1 tablet (250 mg total) by mouth every other day. Please take this tablet on Monday 01/22/2020 Start taking on: January 22, 2020   nebivolol 10 MG tablet Commonly known as: BYSTOLIC Take 10 mg by mouth every morning.   pantoprazole 40 MG tablet Commonly known as: PROTONIX TAKE 1 TABLET BY MOUTH 2 TIMES DAILY BEFORE A MEAL What changed: See the new instructions.       Disposition and follow-up:   KristenKristen Campbell was discharged from [facility] in Stable condition.  At the hospital follow up visit please address:  1.  Pneumococcal Pneumonia: Presented with coughing and SOB. Was started on Levofloxacin and discharged with one additional dose. She had some rib pain from her coughing.  Follow up may need to be done if her rib pain is still present.   AKI in CKD Stage IIIa: Was given fluid and Creatinine trended towards normal on discharge.  2.  Labs / imaging needed at time of follow-up: BMP  3.  Pending labs/ test needing follow-up: none  Follow-up Appointments:   Hospital Course by problem list: 1. Pneumococcal Pneumonia: Presented to ED with 4 days of constant nonproductive cough and SOB both at rest and exertion. V/Q scan was obtained and showed low risk for PE. Was started on Levofloxacin for Strep Pneumonia. Had rib pain due to coughing. Was started on acetaminophen-codeine (120-12 mg/63ml) which suppressed her coughing and allowed her to get sleep. After 3 days she was able to walk without developing dyspnea. She was discharged with one more dose of Levofloxacin to take.   2. AKI in CKD Stage IIIa: On presentation to the ED Creatinine was found to be 2.32 and BUN 45. She states she had been drinking plenty of fluids and still urinating normally. Was started on LR 125 cc/hr. Her Creatinine decreased to 1.60 which was trending to her baseline of ~1.1-1.3.  Discharge Vitals:   BP (!) 157/84 (BP Location: Left Arm)   Pulse 71   Temp (!) 97.5 F (36.4 C)   Resp 19   Ht (!) 5" (0.127 m)   Wt 60.1 kg   SpO2 99%   BMI 3726.20 kg/m   Pertinent Labs, Studies, and Procedures:  BMP Latest Ref Rng &  Units 01/19/2020 01/18/2020 01/17/2020  Glucose 70 - 99 mg/dL 94 720(N) 470(J)  BUN 8 - 23 mg/dL 62(E) 36(O) 29(U)  Creatinine 0.44 - 1.00 mg/dL 7.65(Y) 6.50(P) 5.46(F)  BUN/Creat Ratio 12 - 28 - - -  Sodium 135 - 145 mmol/L 136 137 137  Potassium 3.5 - 5.1 mmol/L 4.7 4.4 4.5  Chloride 98 - 111 mmol/L 109 110 111  CO2 22 - 32 mmol/L 17(L) 14(L) 12(L)  Calcium 8.9 - 10.3 mg/dL 9.1 9.5 9.5   CBC Latest Ref Rng & Units 01/19/2020 01/18/2020 01/17/2020  WBC 4.0 - 10.5 K/uL 10.9(H) 15.8(H) 11.1(H)  Hemoglobin 12.0 - 15.0 g/dL 6.8(L) 10.3(L) 11.4(L)  Hematocrit 36 - 46 % 27.8(L)  32.2(L) 39.6  Platelets 150 - 400 K/uL 251 255 232    Chest X- No active cardiopulmonary disease  CT Head WO Contrast- No acute intracranial abnormalities. Chronic atrophy and small vessel ischemia. Right trans occipital ventricular shunt tube with tip in the anterior right lateral ventricle. Catheter position is unchanged since prior study  Renal US- Bilateral renal parenchymal atrophy. No hydronephrosis. No focal lesions. Bladder is unremarkable.  NM Pulmonary Perfusion- Physiologic distribution of radiopharmaceutical within the bilateral lung fields. No segmental perfusion defect. No clumping of radiotracer.  Discharge Instructions: Discharge Instructions    Call MD for:   Complete by: As directed    Call MD for:  difficulty breathing, headache or visual disturbances   Complete by: As directed    Call MD for:  extreme fatigue   Complete by: As directed    Call MD for:  hives   Complete by: As directed    Call MD for:  persistant dizziness or light-headedness   Complete by: As directed    Call MD for:  persistant nausea and vomiting   Complete by: As directed    Call MD for:  redness, tenderness, or signs of infection (pain, swelling, redness, odor or green/yellow discharge around incision site)   Complete by: As directed    Call MD for:  severe uncontrolled pain   Complete by: As directed    Call MD for:  temperature >100.4   Complete by: As directed    Diet - low sodium heart healthy   Complete by: As directed    Discharge instructions   Complete by: As directed    Dear Kristen Campbell, It was a pleasure taking care of you. You were admitted for Pneumonia. Please take your last dose of antibiotic on Monday 01/22/2020. Please see your primary care provider next week. Thank you very much   Increase activity slowly   Complete by: As directed       Signed: Lauro Franklin, MD 01/20/2020, 5:08 PM   Pager: [phone]

## 2020-01-20 NOTE — Evaluation (Signed)
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 Admitted to the hospital on 01/17/20 for pneumococcal pneumonia.    Clinical Impression  Patient was laying in bed at the start of the PT session, able to answer all questions about home set-up and PLOF. She was independent with supine to sit at EOB transfer and required supervision for sit <> stand and gait training for safety. She stated that she has a RW and SPC at home, however only uses it when she feels unsteady. Ambulated 10' in hospital room due to history of SOB and inability to disconnect from cardiac monitoring on wall) with RW to help maintain balance. She was left in the chair with all needs within reach. She stated she felt like her mobility level is at baseline, that she tries to walk as much as possible, and has good support from her family. At this time, pt does not require further f/u with physical therapy, but advised her to continue exercising on a regular basis and use RW or SPC if feeling unsteady or fatigued. Signing off for physical therapy, thank you for the opportunity to work with this patient!    Follow Up Recommendations No PT follow up    Equipment Recommendations  None recommended by PT    Recommendations for Other Services       Precautions / Restrictions Precautions Precautions: Fall Restrictions Weight Bearing Restrictions: No      Mobility  Bed Mobility Overal bed mobility: Independent                Transfers Overall transfer level: Needs assistance Equipment used: Rolling walker (2 wheeled) Transfers: Sit to/from Stand Sit to Stand: Supervision         General transfer comment: RW used for safety. Patient was supervision with sit <> stand transfer, did not require cueing, no  LOB.  Ambulation/Gait Ambulation/Gait assistance: Supervision Gait Distance (Feet): 10 Feet Assistive device: Rolling walker (2 wheeled) Gait Pattern/deviations: Step-through pattern;Decreased stride length Gait velocity: decreased   General Gait Details: Limited to hospital room gait training due to line attachments. Utilized RW for safety. Supervision level assistance given.  Stairs            Wheelchair Mobility    Modified Rankin (Stroke Patients Only)       Balance Overall balance assessment: Independent                                           Pertinent Vitals/Pain Pain Assessment: No/denies pain    Home Living Family/patient expects to be discharged to:: Private residence Living Arrangements: Children Available Help at Discharge: Family Type of Home: House Home Access: Stairs to enter   [employer] of Steps: doesn't have to use stairs Home Layout: Two level;Able to live on main level with bedroom/bathroom Home Equipment: Gilmer Mor - single point;Bedside commode;Grab bars - tub/shower;Walker - 4 wheels;Shower seat Additional Comments: lives w oldest daughter who is around to help (works from home) granddaughter helps as well    Prior Function Level of Independence: Independent               Hand Dominance   Dominant Hand: Right  Extremity/Trunk Assessment   Upper Extremity Assessment Upper Extremity Assessment: Defer to OT evaluation    Lower Extremity Assessment Lower Extremity Assessment: Overall WFL for tasks assessed    Cervical / Trunk Assessment Cervical / Trunk Assessment: Normal  Communication   Communication: No difficulties  Cognition Arousal/Alertness: Awake/alert Behavior During Therapy: WFL for tasks assessed/performed Overall Cognitive Status: Within Functional Limits for tasks assessed                                        General Comments General comments (skin integrity,  edema, etc.): no LOB throughout PT session    Exercises     Assessment/Plan    PT Assessment Patent does not need any further PT services  PT Problem List         PT Treatment Interventions      PT Goals (Current goals can be found in the Care Plan section)  Acute Rehab PT Goals Patient Stated Goal: go home PT Goal Formulation: With patient Time For Goal Achievement: 02/03/20 Potential to Achieve Goals: Good    Frequency     Barriers to discharge        Co-evaluation               AM-PAC PT "6 Clicks" Mobility  Outcome Measure Help needed turning from your back to your side while in a flat bed without using bedrails?: None Help needed moving from lying on your back to sitting on the side of a flat bed without using bedrails?: None Help needed moving to and from a bed to a chair (including a wheelchair)?: A Little Help needed standing up from a chair using your arms (e.g., wheelchair or bedside chair)?: None Help needed to walk in hospital room?: None Help needed climbing 3-5 steps with a railing? : A Little 6 Click Score: 22    End of Session Equipment Utilized During Treatment: Gait belt Activity Tolerance: Patient tolerated treatment well Patient left: in chair;with call bell/phone within reach Nurse Communication: Mobility status PT Visit Diagnosis: Other abnormalities of gait and mobility (R26.89)    Time:  -      Charges:         Elisha Ponder, SPT, ATC

## 2020-01-20 NOTE — Progress Notes (Signed)
   Subjective: Interviewed patient at bedside.  Reports she was able to sleep better. She reports feeling better. She reports coughing fits are less often and shorter.   Objective:  Vital signs in last 24 hours: Vitals:   01/19/20 2142 01/20/20 0308 01/20/20 0358 01/20/20 0500  BP: (!) 156/62     Pulse: 96     Resp: 20 17  16   Temp: 98.3 F (36.8 C)     TempSrc: Oral     SpO2: 98%     Weight:   60.1 kg   Height:       Physical Exam Vitals and nursing note reviewed.  Constitutional:      General: She is not in acute distress.    Appearance: She is well-developed. She is not ill-appearing or toxic-appearing.  HENT:     Head: Normocephalic and atraumatic.  Cardiovascular:     Rate and Rhythm: Normal rate and regular rhythm.     Pulses: Normal pulses.          Radial pulses are 2+ on the right side and 2+ on the left side.       Dorsalis pedis pulses are 2+ on the right side and 2+ on the left side.     Heart sounds: Normal heart sounds, S1 normal and S2 normal. No murmur heard.   Pulmonary:     Effort: Pulmonary effort is normal.     Breath sounds: Wheezing present.     Comments: Diffuse wheezing present through all lung fields Musculoskeletal:     Right lower leg: No edema.     Left lower leg: No edema.  Neurological:     Mental Status: She is alert.      Assessment/Plan:  Principal Problem:   Pneumococcal pneumonia (HCC) Active Problems:   AKI (acute kidney injury) (HCC)   Kristen Rollinsis a 74 yr old femalewith PMHx of asthma, CVA, CAD, MI, HTN, PUDwho was admitted for rhinorrhea and wheezing.   Dyspnea 2/2 Pneumococcal Pneumonia Currently on room air. Will have next dose of Levofloxacin today. Reports moderate rib pain with coughing. Called her room and she states that she only had a reaction to codeine when she was much younger. She states she has had it since with no problems. Yesterday stated she could only walk a short distance before becoming  short of breath. -Will get Levofloxacin 250 mg today -Continue acetaminophen-codeine (120-12 mg/29ml) 5 ml q6 PRN for moderate pain and coughing  -Continue nebulizer treatments q4 hours during the day - Able to keep O2 sat >90 on room air while talking   Acute Kidney Injury On Chronic Kidney Disease stage 3a On admission Creatinine was 2.32 and Bun was 45. Yesterday Creatinine was 1.6 and BUN was 41, trending towards baseline of ~ 1.1-1.3.  -Continue to trend renal function   Chronic Macrocytic Anemia Hemoglobin yesterday is 8.7. Trending down w/ fluid. Chart review shows chronic anemia w/ hemoglobin at baseline 8-10. MCV elevated at 106.1.  -Continue to trend cbc   HTN - Continue nebivolol 10mg    DVT prophx:subqhep Diet:Regular   Prior to Admission Living Arrangement: Home Anticipated Discharge Location: Home Barriers to Discharge: Shortness of Breath Dispo: Anticipated discharge today.   4m, MD 01/20/2020, 6:03 AM Pager: [phone] After 5pm on weekdays and 1pm on weekends: On Call pager [phone]

## 2020-01-22 LAB — METHYLMALONIC ACID, SERUM: Methylmalonic Acid, Quantitative: 361 nmol/L (ref 0–378)

## 2020-03-05 ENCOUNTER — Telehealth: Payer: Self-pay

## 2020-03-05 NOTE — Telephone Encounter (Signed)
Patient's daughter contacteed the office to set up a new patient appt for patient. This has been scheduled in first available slot for 04/15/20.  The daughter states that patient is on a medication for depression/anixety - but she could not recall the name of the medication. She states her mother has issues with getting very worked up and anxious at times, and she states she is wondering if this medication needs to be adjusted.  Eunice Blase, is this something I can schedule for a 30 min visit prior to her NP appt?

## 2020-03-06 NOTE — Telephone Encounter (Signed)
See if she wants to come October 4 at 10 o'clock. Thanks.

## 2020-03-06 NOTE — Telephone Encounter (Signed)
Appointment made Daughter susan aware

## 2020-03-18 ENCOUNTER — Other Ambulatory Visit: Payer: Self-pay

## 2020-03-18 ENCOUNTER — Ambulatory Visit (INDEPENDENT_AMBULATORY_CARE_PROVIDER_SITE_OTHER): Payer: Medicare Other | Admitting: Family Medicine

## 2020-03-18 ENCOUNTER — Encounter: Payer: Self-pay | Admitting: Family Medicine

## 2020-03-18 VITALS — BP 92/58 | HR 65 | Temp 96.2°F | Ht 58.75 in | Wt 133.5 lb

## 2020-03-18 DIAGNOSIS — Z7689 Persons encountering health services in other specified circumstances: Secondary | ICD-10-CM

## 2020-03-18 DIAGNOSIS — Z23 Encounter for immunization: Secondary | ICD-10-CM | POA: Diagnosis not present

## 2020-03-18 DIAGNOSIS — F419 Anxiety disorder, unspecified: Secondary | ICD-10-CM | POA: Diagnosis not present

## 2020-03-18 DIAGNOSIS — E2839 Other primary ovarian failure: Secondary | ICD-10-CM

## 2020-03-18 DIAGNOSIS — R7989 Other specified abnormal findings of blood chemistry: Secondary | ICD-10-CM

## 2020-03-18 DIAGNOSIS — D62 Acute posthemorrhagic anemia: Secondary | ICD-10-CM

## 2020-03-18 LAB — CBC WITH DIFFERENTIAL/PLATELET
Basophils Absolute: 0 10*3/uL (ref 0.0–0.1)
Basophils Relative: 0.3 % (ref 0.0–3.0)
Eosinophils Absolute: 0.3 10*3/uL (ref 0.0–0.7)
Eosinophils Relative: 5.6 % — ABNORMAL HIGH (ref 0.0–5.0)
HCT: 34.3 % — ABNORMAL LOW (ref 36.0–46.0)
Hemoglobin: 11.4 g/dL — ABNORMAL LOW (ref 12.0–15.0)
Lymphocytes Relative: 13.7 % (ref 12.0–46.0)
Lymphs Abs: 0.8 10*3/uL (ref 0.7–4.0)
MCHC: 33.2 g/dL (ref 30.0–36.0)
MCV: 104.3 fl — ABNORMAL HIGH (ref 78.0–100.0)
Monocytes Absolute: 0.6 10*3/uL (ref 0.1–1.0)
Monocytes Relative: 9.8 % (ref 3.0–12.0)
Neutro Abs: 4.3 10*3/uL (ref 1.4–7.7)
Neutrophils Relative %: 70.6 % (ref 43.0–77.0)
Platelets: 165 10*3/uL (ref 150.0–400.0)
RBC: 3.29 Mil/uL — ABNORMAL LOW (ref 3.87–5.11)
RDW: 13.7 % (ref 11.5–15.5)
WBC: 6.1 10*3/uL (ref 4.0–10.5)

## 2020-03-18 LAB — COMPREHENSIVE METABOLIC PANEL
ALT: 17 U/L (ref 0–35)
AST: 24 U/L (ref 0–37)
Albumin: 4.3 g/dL (ref 3.5–5.2)
Alkaline Phosphatase: 69 U/L (ref 39–117)
BUN: 49 mg/dL — ABNORMAL HIGH (ref 6–23)
CO2: 23 mEq/L (ref 19–32)
Calcium: 9.7 mg/dL (ref 8.4–10.5)
Chloride: 99 mEq/L (ref 96–112)
Creatinine, Ser: 2.37 mg/dL — ABNORMAL HIGH (ref 0.40–1.20)
GFR: 20.13 mL/min — ABNORMAL LOW (ref >60.00)
Glucose, Bld: 92 mg/dL (ref 70–99)
Potassium: 4.8 mEq/L (ref 3.5–5.1)
Sodium: 135 mEq/L (ref 135–145)
Total Bilirubin: 0.4 mg/dL (ref 0.2–1.2)
Total Protein: 7.5 g/dL (ref 6.0–8.3)

## 2020-03-18 LAB — TSH: TSH: 6.16 u[IU]/mL — ABNORMAL HIGH (ref 0.35–4.50)

## 2020-03-18 LAB — VITAMIN D 25 HYDROXY (VIT D DEFICIENCY, FRACTURES): VITD: 26.31 ng/mL — ABNORMAL LOW (ref 30.00–100.00)

## 2020-03-18 MED ORDER — BUSPIRONE HCL 5 MG PO TABS: 5.0000 mg | ORAL_TABLET | Freq: Two times a day (BID) | ORAL | 1 refills | Status: DC

## 2020-03-18 MED ORDER — NEBIVOLOL HCL 5 MG PO TABS: 10.0000 mg | ORAL_TABLET | Freq: Every morning | ORAL | 1 refills | Status: DC

## 2020-03-18 MED ORDER — DULOXETINE HCL 30 MG PO CPEP: 30.0000 mg | ORAL_CAPSULE | Freq: Every morning | ORAL | 1 refills | Status: DC

## 2020-03-18 NOTE — Progress Notes (Signed)
Subjective:    Patient ID: Kristen Campbell, female    DOB: November 14, 1945, 74 y.o.   MRN: [id number]  HPI Chief Complaint  Patient presents with  . Anxiety    pt states lost husband last yr and she can't get over it.  Daughter mad appt for to discuss her anxiety.   This is a 74 yo female. She is alone today. She lives with her daughter. Moved in with her daughter last year. Does some household chores, watches TV, enjoys drawing. 6 grands, 4 great grands, she helps with child care and transportation.   Last CPE- one year ago Mammo- 6 months ago, [place] [facility]- Tdap- unsure Flu- annual Pneumonia vaccines- unknown Covid 19 vaccine- fully vaccinated Eye- annual, wears glasses, last eye exam 6 months ago Dental- regular Exercise- household, some walking Sleep- ok, usually 8 hours at night  SOB- if she walks a long distance  Cyclobenzaprine- takes if she feels "hyper," it calms her down..   Denies feeling depressed. Feels more anxious and nervous.   Was admitted to hospital 8/4- 01/20/20 for pneumonia, AKI in CKD.   Review of Systems No headaches, no chest pain, no abdominal pain, diarrhea/ constipation/ blood or dark stools. No dysuria, occasional nocturia. No muscle or joint pain.     Objective:   Physical Exam Physical Exam  Constitutional: Oriented to person, place, and time. Appears well-developed and well-nourished.  HENT:  Head: Normocephalic and atraumatic.  Eyes: Conjunctivae are normal.  Neck: Normal range of motion. Neck supple.  Cardiovascular: Normal rate, regular rhythm and normal heart sounds.   Pulmonary/Chest: Effort normal and breath sounds normal.  Musculoskeletal: No lower extremity edema.   Neurological: Alert and oriented to person, place, and time.  Skin: Skin is warm and dry.  Psychiatric: Normal mood and affect. Behavior is normal. Judgment and thought content normal.  Vitals reviewed.     BP (!) 92/58   Pulse 65   Temp (!) 96.2 F (35.7  C) (Temporal)   Ht 4' 10.75" (1.492 m)   Wt 133 lb 8 oz (60.6 kg)   SpO2 93%   BMI 27.19 kg/m      Depression screen [facility] 2/9 03/18/2020 04/06/2019  Decreased Interest 0 3  Down, Depressed, Hopeless 0 1  PHQ - 2 Score 0 4  Altered sleeping - 2  Tired, decreased energy - 3  Change in appetite - 0  Feeling bad or failure about yourself  - 1  Trouble concentrating - 0  Moving slowly or fidgety/restless - 0  Suicidal thoughts - 0  PHQ-9 Score - 10  Difficult doing work/chores - Somewhat difficult   GAD 7 : Generalized Anxiety Score 03/18/2020  Nervous, Anxious, on Edge 2  Control/stop worrying 1  Worry too much - different things 0  Trouble relaxing 2  Restless 1  Easily annoyed or irritable 0  Afraid - awful might happen 2  Total GAD 7 Score 8  Anxiety Difficulty Somewhat difficult     Assessment & Plan:  1. Encounter to establish care - will get outside records and review health maintenance - establish care appointment scheduled for 04/15/20  2. Anxiety - busPIRone (BUSPAR) 5 MG tablet; Take 1 tablet (5 mg total) by mouth 2 (two) times daily.  Dispense: 60 tablet; Refill: 1 - TSH  3. Anemia due to acute blood loss - CBC with Differential  4. Elevated serum creatinine - Comprehensive metabolic panel  5. Estrogen deficiency - VITAMIN D 25 Hydroxy (Vit-D Deficiency,  Fractures)  6. Need for influenza vaccination - Flu Vaccine QUAD High Dose(Fluad)  This visit occurred during the SARS-CoV-2 public health emergency.  Safety protocols were in place, including screening questions prior to the visit, additional usage of staff PPE, and extensive cleaning of exam room while observing appropriate contact time as indicated for disinfecting solutions.    Olean Ree, FNP-BC  [facility], [facility]  03/18/2020 9:28 PM

## 2020-03-18 NOTE — Patient Instructions (Signed)
Please follow up in 4-6 weeks  I have sent in a new medication to help with your anxiety- buspirone. Please take twice a day, in the morning and in the afternoon.   We will notify you of your lab results in the next couple of days

## 2020-03-18 NOTE — Addendum Note (Signed)
Addended by: Olean Ree B on: 03/18/2020 09:59 PM   Modules accepted: Orders

## 2020-03-19 ENCOUNTER — Telehealth: Payer: Self-pay

## 2020-03-19 ENCOUNTER — Other Ambulatory Visit (INDEPENDENT_AMBULATORY_CARE_PROVIDER_SITE_OTHER): Payer: Medicare Other

## 2020-03-19 DIAGNOSIS — R7989 Other specified abnormal findings of blood chemistry: Secondary | ICD-10-CM

## 2020-03-19 LAB — T3: T3, Total: 59 ng/dL — ABNORMAL LOW (ref 76–181)

## 2020-03-19 LAB — T4, FREE: Free T4: 0.96 ng/dL (ref 0.60–1.60)

## 2020-03-19 NOTE — Telephone Encounter (Signed)
-----   Message from Emi Belfast, FNP sent at 03/18/2020  9:59 PM EDT ----- Please call patient and let her know that her kidney function is worse. I have made changes in the dose of two her medications that may be affecting her kidneys- bystolic and duloxetine. I have sent in new prescriptions for reduced doses and will recheck her kidney function at her follow up visit in November. Is she taking any anti inflammatory medicines like ibuprofen, naproxen, Advil, Alleve. If so, she needs to stop and can take Tylenol for pain. Her blood work indicated that she needs to increase her water intake by several glasses a day.

## 2020-03-19 NOTE — Telephone Encounter (Signed)
Called pt and left message on vmail to call office and ask to speak to Kristen Campbell.

## 2020-03-20 NOTE — Progress Notes (Unsigned)
c-met  

## 2020-03-20 NOTE — Telephone Encounter (Signed)
See Results Note.  Results and medication changes discussed with daughter.

## 2020-03-29 ENCOUNTER — Emergency Department (HOSPITAL_COMMUNITY): Payer: Medicare Other

## 2020-03-29 ENCOUNTER — Emergency Department (HOSPITAL_COMMUNITY)
Admission: EM | Admit: 2020-03-29 | Discharge: 2020-03-29 | Disposition: A | Discharge status: Home / Self Care | Payer: Medicare Other | Attending: Emergency Medicine | Admitting: Emergency Medicine

## 2020-03-29 ENCOUNTER — Other Ambulatory Visit: Payer: Self-pay

## 2020-03-29 ENCOUNTER — Encounter (HOSPITAL_COMMUNITY): Payer: Self-pay | Admitting: Emergency Medicine

## 2020-03-29 DIAGNOSIS — Z8673 Personal history of transient ischemic attack (TIA), and cerebral infarction without residual deficits: Secondary | ICD-10-CM | POA: Diagnosis not present

## 2020-03-29 DIAGNOSIS — R531 Weakness: Secondary | ICD-10-CM | POA: Insufficient documentation

## 2020-03-29 DIAGNOSIS — I10 Essential (primary) hypertension: Secondary | ICD-10-CM | POA: Insufficient documentation

## 2020-03-29 DIAGNOSIS — Z79899 Other long term (current) drug therapy: Secondary | ICD-10-CM | POA: Insufficient documentation

## 2020-03-29 DIAGNOSIS — R079 Chest pain, unspecified: Secondary | ICD-10-CM

## 2020-03-29 DIAGNOSIS — R200 Anesthesia of skin: Secondary | ICD-10-CM | POA: Diagnosis not present

## 2020-03-29 DIAGNOSIS — R519 Headache, unspecified: Secondary | ICD-10-CM

## 2020-03-29 LAB — COMPREHENSIVE METABOLIC PANEL
ALT: 15 U/L (ref 0–44)
AST: 20 U/L (ref 15–41)
Albumin: 4 g/dL (ref 3.5–5.0)
Alkaline Phosphatase: 70 U/L (ref 38–126)
Anion gap: 10 (ref 5–15)
BUN: 31 mg/dL — ABNORMAL HIGH (ref 8–23)
CO2: 22 mmol/L (ref 22–32)
Calcium: 9.7 mg/dL (ref 8.9–10.3)
Chloride: 109 mmol/L (ref 98–111)
Creatinine, Ser: 1.6 mg/dL — ABNORMAL HIGH (ref 0.44–1.00)
GFR, Estimated: 32 mL/min — ABNORMAL LOW (ref >60)
Glucose, Bld: 102 mg/dL — ABNORMAL HIGH (ref 70–99)
Potassium: 5.3 mmol/L — ABNORMAL HIGH (ref 3.5–5.1)
Sodium: 141 mmol/L (ref 135–145)
Total Bilirubin: 0.7 mg/dL (ref 0.3–1.2)
Total Protein: 7.9 g/dL (ref 6.5–8.1)

## 2020-03-29 LAB — DIFFERENTIAL
Abs Immature Granulocytes: 0.03 10*3/uL (ref 0.00–0.07)
Basophils Absolute: 0.1 10*3/uL (ref 0.0–0.1)
Basophils Relative: 1 %
Eosinophils Absolute: 0.1 10*3/uL (ref 0.0–0.5)
Eosinophils Relative: 1 %
Immature Granulocytes: 0 %
Lymphocytes Relative: 17 %
Lymphs Abs: 1.2 10*3/uL (ref 0.7–4.0)
Monocytes Absolute: 0.4 10*3/uL (ref 0.1–1.0)
Monocytes Relative: 6 %
Neutro Abs: 5.2 10*3/uL (ref 1.7–7.7)
Neutrophils Relative %: 75 %

## 2020-03-29 LAB — CBC
HCT: 36.7 % (ref 36.0–46.0)
Hemoglobin: 11.3 g/dL — ABNORMAL LOW (ref 12.0–15.0)
MCH: 32.4 pg (ref 26.0–34.0)
MCHC: 30.8 g/dL (ref 30.0–36.0)
MCV: 105.2 fL — ABNORMAL HIGH (ref 80.0–100.0)
Platelets: 444 10*3/uL — ABNORMAL HIGH (ref 150–400)
RBC: 3.49 MIL/uL — ABNORMAL LOW (ref 3.87–5.11)
RDW: 12.8 % (ref 11.5–15.5)
WBC: 7 10*3/uL (ref 4.0–10.5)
nRBC: 0 % (ref 0.0–0.2)

## 2020-03-29 LAB — I-STAT CHEM 8, ED
BUN: 34 mg/dL — ABNORMAL HIGH (ref 8–23)
Calcium, Ion: 1.24 mmol/L (ref 1.15–1.40)
Chloride: 111 mmol/L (ref 98–111)
Creatinine, Ser: 1.6 mg/dL — ABNORMAL HIGH (ref 0.44–1.00)
Glucose, Bld: 98 mg/dL (ref 70–99)
HCT: 34 % — ABNORMAL LOW (ref 36.0–46.0)
Hemoglobin: 11.6 g/dL — ABNORMAL LOW (ref 12.0–15.0)
Potassium: 5.2 mmol/L — ABNORMAL HIGH (ref 3.5–5.1)
Sodium: 140 mmol/L (ref 135–145)
TCO2: 22 mmol/L (ref 22–32)

## 2020-03-29 LAB — APTT: aPTT: 29 seconds (ref 24–36)

## 2020-03-29 LAB — PROTIME-INR
INR: 1 (ref 0.8–1.2)
Prothrombin Time: 12.5 seconds (ref 11.4–15.2)

## 2020-03-29 LAB — TROPONIN I (HIGH SENSITIVITY): Troponin I (High Sensitivity): 11 ng/L (ref <18)

## 2020-03-29 LAB — CBG MONITORING, ED: Glucose-Capillary: 83 mg/dL (ref 70–99)

## 2020-03-29 MED ORDER — PANTOPRAZOLE SODIUM 40 MG IV SOLR
40.0000 mg | Freq: Once | INTRAVENOUS | Status: AC
  Administered 2020-03-29: 40 mg via INTRAVENOUS
  Filled 2020-03-29: qty 40

## 2020-03-29 MED ORDER — LORAZEPAM 2 MG/ML IJ SOLN
1.0000 mg | Freq: Once | INTRAMUSCULAR | Status: AC | PRN
  Administered 2020-03-29: 1 mg via INTRAVENOUS
  Filled 2020-03-29: qty 1

## 2020-03-29 MED ORDER — FENTANYL CITRATE (PF) 100 MCG/2ML IJ SOLN
50.0000 ug | Freq: Once | INTRAMUSCULAR | Status: AC
  Administered 2020-03-29: 50 ug via INTRAVENOUS
  Filled 2020-03-29: qty 2

## 2020-03-29 MED ORDER — LORAZEPAM 2 MG/ML IJ SOLN: 1.0000 mg | Freq: Once | INTRAMUSCULAR | Status: DC

## 2020-03-29 NOTE — ED Triage Notes (Addendum)
Pt arrives via [facility] from home with numbness weakness and tingling x3 weeks, R hand grip weaker than left, decreased sensation to R arm , leg and cheek, now having dizziness and burning sensation to R leg as well as nausea. 20G LAC, pt received 4mg  zofran, a/ox4, speech mildly slurred,  face symmetrical.

## 2020-03-29 NOTE — ED Provider Notes (Signed)
[facility] EMERGENCY DEPARTMENT Provider Note   CSN: [id number] Arrival date & time: 03/29/20  1213     History Chief Complaint  Patient presents with  . Weakness    Kristen Campbell is a 74 y.o. female history of brain aneurysm, VP shunt, CVA, hyperlipidemia, hypertension, seizures.  Patient arrives via EMS for right-sided arm and leg numbness and weakness x2-3 weeks.  Patient reports symptoms have been constant since onset, no aggravating or alleviating factors.  She denies similar in the past.  She had hoped that symptoms would improve but as the had not she called EMS for evaluation today.  She reports some lightheadedness over the past few days but no other concerns.  Associated symptom mild generalized headache without radiation or aggravating/alleviating factors.  Denies fever/chills, fall/injuries, vision change, neck pain, back pain, chest pain, abdominal pain, vomiting, diarrhea or any additional concerns.  HPI     Past Medical History:  Diagnosis Date  . Brain aneurysm   . Depression   . Hypertension     Patient Active Problem List   Diagnosis Date Noted  . AKI (acute kidney injury) (HCC) 01/18/2020  . Pneumococcal pneumonia (HCC) 01/18/2020  . Bilateral leg edema 04/07/2019  . Pyloric ulcer 03/12/2019  . Essential hypertension 03/12/2019  . 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 Esophagitis 06/04/2017  . Anemia 06/02/2017  . History of CVA (cerebrovascular accident) 06/02/2017  . Hyperlipidemia 06/02/2017  . History of esophageal ulcer 08/10/2015  . Screen for colon cancer 08/10/2015  . 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 Cerebral embolism with cerebral infarction (HCC) 12/12/2014  . Caregiver with fatigue 09/09/2014  . Adjustment disorder with depressed mood 09/09/2014  . Chest pain 02/11/2013  . Seizure disorder (HCC) 02/11/2013    Past Surgical History:  Procedure Laterality Date  . ABDOMINAL SURGERY     fluid tumor removal  . ABDOMINAL  SURGERY     bleeding ulcers  . BALLOON DILATION N/A 03/12/2019   Procedure: BALLOON DILATION;  Surgeon: Kerin Salen, MD;  Location: [facility] ENDOSCOPY;  Service: Gastroenterology;  Laterality: N/A;  . BIOPSY  03/12/2019   Procedure: BIOPSY;  Surgeon: Kerin Salen, MD;  Location: [facility] ENDOSCOPY;  Service: Gastroenterology;;  . CEREBRAL ANEURYSM REPAIR    . CSF SHUNT    . ESOPHAGOGASTRODUODENOSCOPY (EGD) WITH PROPOFOL N/A 03/12/2019   Procedure: ESOPHAGOGASTRODUODENOSCOPY (EGD) WITH PROPOFOL;  Surgeon: Kerin Salen, MD;  Location: [facility] ENDOSCOPY;  Service: Gastroenterology;  Laterality: N/A;  . ROTATOR CUFF REPAIR       OB History   No obstetric history on file.     No family history on file.  Social History   Tobacco Use  . Smoking status: Never Smoker  . Smokeless tobacco: Never Used  Substance Use Topics  . Alcohol use: No  . Drug use: No    Home Medications Prior to Admission medications   Medication Sig Start Date End Date Taking? Authorizing Provider  busPIRone (BUSPAR) 5 MG tablet Take 1 tablet (5 mg total) by mouth 2 (two) times daily. 03/18/20   Emi Belfast, FNP  cyclobenzaprine (FLEXERIL) 10 MG tablet Take 10 mg by mouth in the morning and at bedtime.    [provider]  DULoxetine (CYMBALTA) 30 MG capsule Take 1 capsule (30 mg total) by mouth every morning. 03/18/20   Emi Belfast, FNP  hydrochlorothiazide (MICROZIDE) 12.5 MG capsule Take 1 capsule (12.5 mg total) by mouth daily. 05/01/19   Levora Dredge, MD  nebivolol (BYSTOLIC) 5 MG  tablet Take 2 tablets (10 mg total) by mouth every morning. 03/18/20   Emi Belfast, FNP  pantoprazole (PROTONIX) 40 MG tablet TAKE 1 TABLET BY MOUTH 2 TIMES DAILY BEFORE A MEAL Patient taking differently: Take 40 mg by mouth 2 (two) times daily before a meal.  05/29/19   Levora Dredge, MD    Allergies    Carbamazepine, Potassium chloride, Ibuprofen, Codeine, and Dilantin [phenytoin]  Review of Systems   Review of  Systems Ten systems are reviewed and are negative for acute change except as noted in the HPI  Physical Exam Updated Vital Signs BP 125/82 (BP Location: Right Arm)   Pulse 63   Temp 99.3 F (37.4 C) (Oral)   Resp 18   Ht 5' (1.524 m)   Wt 59 kg   SpO2 99%   BMI 25.39 kg/m   Physical Exam Constitutional:      General: She is not in acute distress.    Appearance: Normal appearance. She is well-developed. She is not ill-appearing or diaphoretic.  HENT:     Head: Normocephalic and atraumatic.  Eyes:     General: Vision grossly intact. Gaze aligned appropriately.     Pupils: Pupils are equal, round, and reactive to light.  Neck:     Trachea: Trachea and phonation normal.     Meningeal: Brudzinski's sign absent.  Pulmonary:     Effort: Pulmonary effort is normal. No respiratory distress.  Abdominal:     General: There is no distension.     Palpations: Abdomen is soft.     Tenderness: There is no abdominal tenderness. There is no guarding or rebound.  Musculoskeletal:        General: Normal range of motion.     Cervical back: Normal range of motion and neck supple. No spinous process tenderness or muscular tenderness.  Skin:    General: Skin is warm and dry.  Neurological:     Mental Status: She is alert.     GCS: GCS eye subscore is 4. GCS verbal subscore is 5. GCS motor subscore is 6.     Comments: Speech is clear and goal oriented, follows commands Major Cranial nerves without deficit, no facial droop 4/5 strength right arm and leg compared to left.  Psychiatric:        Behavior: Behavior normal.     ED Results / Procedures / Treatments   Labs (all labs ordered are listed, but only abnormal results are displayed) Labs Reviewed  CBC - Abnormal; Notable for the following components:      Result Value   RBC 3.49 (*)    Hemoglobin 11.3 (*)    MCV 105.2 (*)    Platelets 444 (*)    All other components within normal limits  COMPREHENSIVE METABOLIC PANEL - Abnormal;  Notable for the following components:   Potassium 5.3 (*)    Glucose, Bld 102 (*)    BUN 31 (*)    Creatinine, Ser 1.60 (*)    GFR, Estimated 32 (*)    All other components within normal limits  I-STAT CHEM 8, ED - Abnormal; Notable for the following components:   Potassium 5.2 (*)    BUN 34 (*)    Creatinine, Ser 1.60 (*)    Hemoglobin 11.6 (*)    HCT 34.0 (*)    All other components within normal limits  PROTIME-INR  APTT  DIFFERENTIAL  CBG MONITORING, ED    EKG EKG Interpretation  Date/Time:  Friday March 29 2020 12:11:42 EDT Ventricular Rate:  63 PR Interval:  158 QRS Duration: 96 QT Interval:  448 QTC Calculation: 458 R Axis:   20 Text Interpretation: Normal sinus rhythm Minimal voltage criteria for LVH, may be normal variant ( Cornell product ) Anterior infarct , age undetermined Abnormal ECG Confirmed by Vanetta Mulders [phone]) on 03/29/2020 2:24:20 PM   Radiology No results found.  Procedures Procedures (including critical care time)  Medications Ordered in ED Medications - No data to display  ED Course  I have reviewed the triage vital signs and the nursing notes.  Pertinent labs & imaging results that were available during my care of the patient were reviewed by me and considered in my medical decision making (see chart for details).    MDM Rules/Calculators/A&P                         Additional history obtained from: 1. Nursing notes from this visit. 2. Electronic medical record. ------------------------------ 74 year old female presented with 2 to 3 weeks of right arm and leg weakness and numbness.  Symptoms have been constant since onset.  She is also endorsing a headache and some lightheadedness as well.  On examination she has mild weakness of the right upper and lower extremities compared to left, no obvious cranial nerve deficits.  No meningeal signs.  She has no other complaints today vital signs stable on arrival.  CVA work-up was initiated  in triage reviewed below.  CMP shows mild hyperkalemia 5.3, baseline creatinine of 1.6, normal LFTs, no gap. CBG 83. aPTT and PT/INR within normal limits. CBC shows baseline hemoglobin of 11.3, no leukocytosis to suggest infection. CT head:  IMPRESSION:  1. No acute intracranial findings.  2. Chronic microvascular ischemic change and cerebral volume loss.  3. Right sphenoid sinus disease.  4. Unchanged positioning of ventriculostomy catheter.   EKG: Normal sinus rhythm Minimal voltage criteria for LVH, may be normal variant ( Cornell product ) Anterior infarct , age undetermined Abnormal ECG Confirmed by Vanetta Mulders [phone]) on 03/29/2020 2:24:20 PM - Discussed case with neurologist Dr. Loleta Books, advised MRI brain and cervical spine without contrast. - Advised by MRI technician that patient's VP shunt is programmable and will need neurosurgery consultation for follow-up as MRI could reset this device. - Discussed case with neurosurgeon Dr. Jake Samples, asks that we obtain AP plain films of patient's skull.  If patient is to be discharged she should be referred to his office for follow-up on Monday to ensure device is reset. Asks for call back prior to disposition. - Patient reassessed resting comfortably no acute distress reports continued headache, additional pain medication ordered. - Patient seen and evaluated by Dr. Rosalia Hammers, patient now admits to chest pain onset yesterday.  Troponin and chest x-ray were added to patient's work-up. - CXR:  IMPRESSION:  1. No acute cardiopulmonary disease.  2. Visualized portions shunt catheters appear intact and are  unchanged from the prior chest radiograph   High-sensitivity troponin within normal limits, no indication for delta troponin given onset was yesterday. - 5:56 PM: Patient reevaluated, resting comfortably in bed, NAD. Reports some improvement in chest pain, she reports it feels similar to her acid reflux. Protonix ordered. She reports  ongoing headache as well initially improved earlier with fentanyl, additional dose ordered. - Care handoff given to Trisha Mangle, PA-C at shift change.  Plan of care is following up on pending studies.  Disposition per oncoming team.  Of note neurosurgery will  need call back if discharged for the VP shunt reset.   Note: Portions of this report may have been transcribed using voice recognition software. Every effort was made to ensure accuracy; however, inadvertent computerized transcription errors may still be present. Final Clinical Impression(s) / ED Diagnoses Final diagnoses:  None    Rx / DC Orders ED Discharge Orders    None       Elizabeth PalauMorelli, Ericson Nafziger A, PA-C 03/29/20 1835    Margarita Grizzleay, Danielle, MD 03/30/20 0002

## 2020-03-29 NOTE — Discharge Instructions (Signed)
Follow up with your Physician for recheck  

## 2020-03-29 NOTE — ED Notes (Signed)
Patient verbalizes understanding of discharge instructions. Opportunity for questioning and answers were provided. Armband removed by staff, pt discharged from ED ambulatory.   

## 2020-03-29 NOTE — ED Provider Notes (Signed)
Pt's care assumed at 7pm.  Pt reports her headache is better.  Pt reports she still has tingling in her right arm.   MRi head and c spine.  No acute changes I contacted Dr. Jake Samples Neurosurgeon.  He reviewed MRI and [facility] notes.  Pt does not have a programmable shunt.  She does not need to follow up with him     Osie Cheeks 03/29/20 2145    Margarita Grizzle, MD 03/30/20 0002

## 2020-04-15 ENCOUNTER — Other Ambulatory Visit: Payer: Self-pay

## 2020-04-15 ENCOUNTER — Encounter: Payer: Self-pay | Admitting: Family Medicine

## 2020-04-15 ENCOUNTER — Ambulatory Visit (INDEPENDENT_AMBULATORY_CARE_PROVIDER_SITE_OTHER): Payer: Medicare Other | Admitting: Family Medicine

## 2020-04-15 VITALS — BP 126/74 | HR 75 | Temp 97.6°F | Ht 58.5 in | Wt 135.0 lb

## 2020-04-15 DIAGNOSIS — Z23 Encounter for immunization: Secondary | ICD-10-CM | POA: Diagnosis not present

## 2020-04-15 DIAGNOSIS — M62838 Other muscle spasm: Secondary | ICD-10-CM

## 2020-04-15 DIAGNOSIS — R7989 Other specified abnormal findings of blood chemistry: Secondary | ICD-10-CM

## 2020-04-15 DIAGNOSIS — I10 Essential (primary) hypertension: Secondary | ICD-10-CM | POA: Diagnosis not present

## 2020-04-15 DIAGNOSIS — Z7689 Persons encountering health services in other specified circumstances: Secondary | ICD-10-CM | POA: Diagnosis not present

## 2020-04-15 DIAGNOSIS — F419 Anxiety disorder, unspecified: Secondary | ICD-10-CM | POA: Diagnosis not present

## 2020-04-15 MED ORDER — CYCLOBENZAPRINE HCL 10 MG PO TABS: 10.0000 mg | ORAL_TABLET | Freq: Two times a day (BID) | ORAL | 1 refills | Status: DC

## 2020-04-15 NOTE — Progress Notes (Signed)
Subjective:    Patient ID: Kristen Campbell, female    DOB: 12-13-1945, 74 y.o.   MRN: [id number]  HPI Chief Complaint  Patient presents with  . New Patient (Initial Visit)  . Medication Refill    flexeril   This is a 74 yo female who presents today to establish care. She had an acute visit with me last month for anxiety.    Last CPE- 02/2020 Mammo- 4/21 Pap- aged out of screening Colonoscopy- per patient, has had cologuard Tdap- unsure, will check records Flu- annual, has already had Covid 19 vaccine- fully vaccinated Eye- up to date, wears glasses Dental- regular Exercise- household chores, some walking  Anxiety- reports that she is doing better on buspirone 5 mg bid. Anxiety related to loss of her husband Mar 03, 2023. He passed away one evening in their bed unexpectedly. They had been married for 52 years and had just celebrated their anniversary.  She reports that she has good support.  She lives with her daughter her daughter and helps with care of her with her daughter and helps great-grandchildren.  History of CVA/ IV shunt- no deficits but has right sided arm and leg muscle cramping on occasion. Has used cyclobenzaprine in the past with good results. Denies fatigue, dizziness/ light headedness with use. She follows up with [facility] neurology.   Headache- had recent headache with ER visit, negative head CT, headache resolved.   Review of Systems Denies recent headache, vision change, chest pain, shortness of breath, abdominal pain, diarrhea/constipation, dysuria, hematuria, urinary frequency, muscle/joint pain, leg swelling.     Objective:   Physical Exam Physical Exam  Constitutional: Oriented to person, place, and time. Appears well-developed and well-nourished.  HENT:  Head: Normocephalic and atraumatic.  Eyes: Conjunctivae are normal.  Neck: Normal range of motion. Neck supple.  Cardiovascular: Normal rate, regular rhythm and normal heart sounds.   Pulmonary/Chest: Effort  normal and breath sounds normal.  Musculoskeletal: No lower extremity edema.   Neurological: Alert and oriented to person, place, and time.  Skin: Skin is warm and dry.  Psychiatric: Normal mood and affect. Behavior is normal. Judgment and thought content normal.  Vitals reviewed.     BP (!) 140/96   Pulse 75   Temp 97.6 F (36.4 C) (Temporal)   Ht 4' 10.5" (1.486 m)   Wt 135 lb (61.2 kg)   SpO2 92%   BMI 27.73 kg/m  Wt Readings from Last 3 Encounters:  04/15/20 135 lb (61.2 kg)  03/29/20 130 lb (59 kg)  03/18/20 133 lb 8 oz (60.6 kg)   BP Readings from Last 3 Encounters:  04/15/20 126/74  03/29/20 (!) 155/78  03/18/20 (!) 92/58   .    Assessment & Plan:  1. Encounter to establish care - will request records from Dr. Mercy Moore  2. Muscle spasm - patient reports use of cyclobenzaprine with good results.  Advised her of lightheadedness, sedation potential - cyclobenzaprine (FLEXERIL) 10 MG tablet; Take 1 tablet (10 mg total) by mouth in the morning and at bedtime.  Dispense: 60 tablet; Refill: 1  3. Need for vaccination with 13-polyvalent pneumococcal conjugate vaccine - Pneumococcal conjugate vaccine 13-valent  4. Essential hypertension -Mildly elevated when she first came into the office but improved with recheck  5. Anxiety -She reports improvement with buspirone 5 mg twice daily.  Discussed grieving process and upcoming holidays.  She has good family support.  6. Elevated serum creatinine -Noted at last visit, had decreased her by systolic and duloxetine.  Repeat creatinine in ER was back to her baseline. Will check outside records for additional information, ? Nephrology referral.   -Follow-up in 6 months  This visit occurred during the SARS-CoV-2 public health emergency.  Safety protocols were in place, including screening questions prior to the visit, additional usage of staff PPE, and extensive cleaning of exam room while observing appropriate contact  time as indicated for disinfecting solutions.    Olean Ree, FNP-BC  [facility], [facility]  04/15/2020 1:19 PM

## 2020-04-15 NOTE — Patient Instructions (Addendum)
Good to see you today  Please follow up in 6 months  You can get your shingles vaccine at your local pharmacy, wait for 4 weeks after pneumonia vaccine

## 2020-04-16 ENCOUNTER — Encounter: Payer: Self-pay | Admitting: Family Medicine

## 2020-04-18 ENCOUNTER — Other Ambulatory Visit: Payer: Self-pay | Admitting: Family Medicine

## 2020-04-18 DIAGNOSIS — I10 Essential (primary) hypertension: Secondary | ICD-10-CM

## 2020-04-19 NOTE — Telephone Encounter (Signed)
Pharmacy requests refill on: Hydrochlorothiazide 12.5 mg  LAST REFILL: 05/01/2019 LAST OV: 04/15/2020 NEXT OV: Not Scheduled Kristen Heys, NP recommended F/U in 6 months from LOV) PHARMACY: [facility]

## 2020-04-29 ENCOUNTER — Ambulatory Visit: Payer: Medicare Other | Admitting: Family Medicine

## 2021-11-07 ENCOUNTER — Encounter: Payer: Self-pay | Admitting: Physician Assistant

## 2021-11-14 ENCOUNTER — Encounter: Payer: Self-pay | Admitting: Physician Assistant

## 2021-11-14 ENCOUNTER — Ambulatory Visit: Payer: Medicare Other | Admitting: Physician Assistant

## 2021-11-14 VITALS — BP 150/87 | HR 101 | Resp 18 | Ht 60.0 in | Wt 143.0 lb

## 2021-11-14 DIAGNOSIS — R413 Other amnesia: Secondary | ICD-10-CM | POA: Diagnosis not present

## 2021-11-14 DIAGNOSIS — G309 Alzheimer's disease, unspecified: Secondary | ICD-10-CM | POA: Diagnosis not present

## 2021-11-14 DIAGNOSIS — F0150 Vascular dementia without behavioral disturbance: Secondary | ICD-10-CM | POA: Diagnosis not present

## 2021-11-14 DIAGNOSIS — R4020 Unspecified coma: Secondary | ICD-10-CM | POA: Diagnosis not present

## 2021-11-14 DIAGNOSIS — F0280 Dementia in other diseases classified elsewhere without behavioral disturbance: Secondary | ICD-10-CM
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
## 2021-11-14 NOTE — Patient Instructions (Addendum)
It was a pleasure to see you today at our office.   Recommendations:  Neurocognitive evaluation at our office MRI of the brain, the radiology office will call you to arrange you appointment EEG Check labs today Talk with your doctor about psychotherapy  Follow up in 20month  Whom to call:  Memory  decline, memory medications: Call our office [phone]   For psychiatric meds, mood meds: Please have your primary care physician manage these medications.   Counseling regarding caregiver distress, including caregiver depression, anxiety and issues regarding community resources, adult day care programs, adult living facilities, or memory care questions:   Feel free to contact [name] Lisabeth Register, Social Worker at [phone]   For assessment of decision of mental capacity and competency:  Call Dr. Erick Blinks, geriatric psychiatrist at [phone]  For guidance in geriatric dementia issues please call [facility] [phone]  For guidance regarding [facility] and if placement were needed at the facility, contact Sidney Ace, Social Worker tel: [phone]  If you have any severe symptoms of a stroke, or other severe issues such as confusion,severe chills or fever, etc call 911 or go to the ER as you may need to be evaluated further   Consider [facility]  [address], [place] [address] [phone]  Hours of Operation Mondays to Thursdays: 8 am to 8 pm,Fridays: 9 am to 8 pm, Saturdays: 9 am to 1 pm Sundays: Closed  [email or web address]-[email or web address]-[email or web address]      RECOMMENDATIONS FOR ALL PATIENTS WITH MEMORY PROBLEMS: 1. Continue to exercise (Recommend 30 minutes of walking everyday, or 3 hours every week) 2. Increase social interactions - continue going to [place] and enjoy social gatherings with friends and family 3. Eat healthy, avoid fried foods and eat  more fruits and vegetables 4. Maintain adequate blood pressure, blood sugar, and blood cholesterol level. Reducing the risk of stroke and cardiovascular disease also helps promoting better memory. 5. Avoid stressful situations. Live a simple life and avoid aggravations. Organize your time and prepare for the next day in anticipation. 6. Sleep well, avoid any interruptions of sleep and avoid any distractions in the bedroom that may interfere with adequate sleep quality 7. Avoid sugar, avoid sweets as there is a strong link between excessive sugar intake, diabetes, and cognitive impairment We discussed the Mediterranean diet, which has been shown to help patients reduce the risk of progressive memory disorders and reduces cardiovascular risk. This includes eating fish, eat fruits and green leafy vegetables, nuts like almonds and hazelnuts, walnuts, and also use olive oil. Avoid fast foods and fried foods as much as possible. Avoid sweets and sugar as sugar use has been linked to worsening of memory function.  There is always a concern of gradual progression of memory problems. If this is the case, then we may need to adjust level of care according to patient needs. Support, both to the patient and caregiver, should then be put into place.      You have been referred for a neuropsychological evaluation (i.e., evaluation of memory and thinking abilities). Please bring someone with you to this appointment if possible, as it is helpful for the doctor to hear from both you and another adult who knows you well. Please bring eyeglasses and hearing aids if you wear them.    The evaluation will take approximately 3 hours and has two parts:   The first part is a clinical interview with the neuropsychologist (Dr. Milbert Coulter or Dr. Roseanne Reno). During  the interview, the neuropsychologist will speak with you and the individual you brought to the appointment.    The second part of the evaluation is testing with the  doctor's technician Hinton Dyer or Maudie Mercury). During the testing, the technician will ask you to remember different types of material, solve problems, and answer some questionnaires. Your family member will not be present for this portion of the evaluation.   Please note: We must reserve several hours of the neuropsychologist's time and the psychometrician's time for your evaluation appointment. As such, there is a No-Show fee of $100. If you are unable to attend any of your appointments, please contact our office as soon as possible to reschedule.    FALL PRECAUTIONS: Be cautious when walking. Scan the area for obstacles that may increase the risk of trips and falls. When getting up in the mornings, sit up at the edge of the bed for a few minutes before getting out of bed. Consider elevating the bed at the head end to avoid drop of blood pressure when getting up. Walk always in a well-lit room (use night lights in the walls). Avoid area rugs or power cords from appliances in the middle of the walkways. Use a walker or a cane if necessary and consider physical therapy for balance exercise. Get your eyesight checked regularly.  FINANCIAL OVERSIGHT: Supervision, especially oversight when making financial decisions or transactions is also recommended.  HOME SAFETY: Consider the safety of the kitchen when operating appliances like stoves, microwave oven, and blender. Consider having supervision and share cooking responsibilities until no longer able to participate in those. Accidents with firearms and other hazards in the house should be identified and addressed as well.   ABILITY TO BE LEFT ALONE: If patient is unable to contact 911 operator, consider using LifeLine, or when the need is there, arrange for someone to stay with patients. Smoking is a fire hazard, consider supervision or cessation. Risk of wandering should be assessed by caregiver and if detected at any point, supervision and safe proof recommendations  should be instituted.  MEDICATION SUPERVISION: Inability to self-administer medication needs to be constantly addressed. Implement a mechanism to ensure safe administration of the medications.   DRIVING: Regarding driving, in patients with progressive memory problems, driving will be impaired. We advise to have someone else do the driving if trouble finding directions or if minor accidents are reported. Independent driving assessment is available to determine safety of driving.   If you are interested in the driving assessment, you can contact the following:  [employer] in [place]  [place] [phone] or [phone]    [place] refers to food and lifestyle choices that are based on the traditions of countries located on the [employer]. This way of eating has been shown to help prevent certain conditions and improve outcomes for people who have chronic diseases, like kidney disease and heart disease. What are tips for following this plan? Lifestyle  Cook and eat meals together with your family, when possible. Drink enough fluid to keep your urine clear or pale yellow. Be physically active every day. This includes: Aerobic exercise like running or swimming. Leisure activities like gardening, walking, or housework. Get 7-8 hours of sleep each night. If recommended by your health care provider, drink red wine in moderation. This means 1 glass a day for nonpregnant women and 2 glasses a day for men. A glass  of wine equals 5 oz (150 mL). Reading food labels  Check the serving size of packaged foods. For foods such as rice and pasta, the serving size refers to the amount of cooked product, not dry. Check the total fat in packaged foods. Avoid foods that have saturated fat or trans fats. Check the ingredients list for added sugars,  such as corn syrup. Shopping  At the grocery store, buy most of your food from the areas near the walls of the store. This includes: Fresh fruits and vegetables (produce). Grains, beans, nuts, and seeds. Some of these may be available in unpackaged forms or large amounts (in bulk). Fresh seafood. Poultry and eggs. Low-fat dairy products. Buy whole ingredients instead of prepackaged foods. Buy fresh fruits and vegetables in-season from local farmers markets. Buy frozen fruits and vegetables in resealable bags. If you do not have access to quality fresh seafood, buy precooked frozen shrimp or canned fish, such as tuna, salmon, or sardines. Buy small amounts of raw or cooked vegetables, salads, or olives from the deli or salad bar at your store. Stock your pantry so you always have certain foods on hand, such as olive oil, canned tuna, canned tomatoes, rice, pasta, and beans. Cooking  Cook foods with extra-virgin olive oil instead of using butter or other vegetable oils. Have meat as a side dish, and have vegetables or grains as your main dish. This means having meat in small portions or adding small amounts of meat to foods like pasta or stew. Use beans or vegetables instead of meat in common dishes like chili or lasagna. Experiment with different cooking methods. Try roasting or broiling vegetables instead of steaming or sauteing them. Add frozen vegetables to soups, stews, pasta, or rice. Add nuts or seeds for added healthy fat at each meal. You can add these to yogurt, salads, or vegetable dishes. Marinate fish or vegetables using olive oil, lemon juice, garlic, and fresh herbs. Meal planning  Plan to eat 1 vegetarian meal one day each week. Try to work up to 2 vegetarian meals, if possible. Eat seafood 2 or more times a week. Have healthy snacks readily available, such as: Vegetable sticks with hummus. Greek yogurt. Fruit and nut trail mix. Eat balanced meals throughout the week. This  includes: Fruit: 2-3 servings a day Vegetables: 4-5 servings a day Low-fat dairy: 2 servings a day Fish, poultry, or lean meat: 1 serving a day Beans and legumes: 2 or more servings a week Nuts and seeds: 1-2 servings a day Whole grains: 6-8 servings a day Extra-virgin olive oil: 3-4 servings a day Limit red meat and sweets to only a few servings a month What are my food choices? Mediterranean diet Recommended Grains: Whole-grain pasta. Brown rice. Bulgar wheat. Polenta. Couscous. Whole-wheat bread. [place]. Vegetables: Artichokes. Beets. Broccoli. Cabbage. Carrots. Eggplant. Green beans. Chard. Kale. Spinach. Onions. Leeks. Peas. Squash. Tomatoes. Peppers. Radishes. Fruits: Apples. Apricots. Avocado. Berries. Bananas. Cherries. Dates. Figs. Grapes. Lemons. Melon. Oranges. Peaches. Plums. Pomegranate. Meats and other protein foods: Beans. Almonds. Sunflower seeds. Pine nuts. Peanuts. [place]. Salmon. Scallops. Shrimp. [place]. Tilapia. Clams. Oysters. Eggs. Dairy: Low-fat milk. Cheese. Greek yogurt. Beverages: Water. Red wine. Herbal tea. Fats and oils: Extra virgin olive oil. Avocado oil. Grape seed oil. Sweets and desserts: [place] yogurt with honey. Baked apples. Poached pears. Trail mix. Seasoning and other foods: Basil. Cilantro. Coriander. Cumin. Mint. Parsley. Sage. Rosemary. Tarragon. Garlic. Oregano. Thyme. Pepper. Balsalmic vinegar. Tahini. Hummus. Tomato sauce. Olives. Mushrooms. Limit these Grains: Prepackaged  pasta or rice dishes. Prepackaged cereal with added sugar. Vegetables: Deep fried potatoes (french fries). Fruits: Fruit canned in syrup. Meats and other protein foods: Beef. Pork. Lamb. Poultry with skin. Hot dogs. [place] Salines. Dairy: Ice cream. Sour cream. Whole milk. Beverages: Juice. Sugar-sweetened soft drinks. Beer. Liquor and spirits. Fats and oils: Butter. Canola oil. Vegetable oil. Beef fat (tallow). Lard. Sweets and desserts: Cookies. Cakes. Pies. Candy. Seasoning  and other foods: Mayonnaise. Premade sauces and marinades. The items listed may not be a complete list. Talk with your dietitian about what dietary choices are right for you. Summary The Mediterranean diet includes both food and lifestyle choices. Eat a variety of fresh fruits and vegetables, beans, nuts, seeds, and whole grains. Limit the amount of red meat and sweets that you eat. Talk with your health care provider about whether it is safe for you to drink red wine in moderation. This means 1 glass a day for nonpregnant women and 2 glasses a day for men. A glass of wine equals 5 oz (150 mL). This information is not intended to replace advice given to you by your health care provider. Make sure you discuss any questions you have with your health care provider. Document Released: 01/23/2016 Document Revised: 02/25/2016 Document Reviewed: 01/23/2016 Elsevier Interactive Patient Education  2017 [employer].  EEG will be scheduled at [facility] they will contact you to schedule. We have sent a referral to [facility] for your MRI and they will call you directly to schedule your appointment. They are located at [place]. If you need to contact them directly please call [phone].

## 2021-12-10 ENCOUNTER — Telehealth: Payer: Self-pay | Admitting: Physician Assistant

## 2021-12-10 NOTE — Telephone Encounter (Signed)
Patient's granddaughter [place] called about the patient's MRI scheduled 11/19/21 at [facility].  She said the patient is claustrophobic and needs an Rx to help with this.  [facility] in [place]

## 2021-12-11 ENCOUNTER — Other Ambulatory Visit: Payer: Self-pay | Admitting: Physician Assistant

## 2021-12-11 MED ORDER — DIAZEPAM 5 MG PO TABS: ORAL_TABLET | ORAL | 0 refills | Status: DC

## 2021-12-17 ENCOUNTER — Telehealth: Payer: Self-pay | Admitting: Physician Assistant

## 2021-12-17 ENCOUNTER — Other Ambulatory Visit: Payer: Self-pay | Admitting: Physician Assistant

## 2021-12-17 ENCOUNTER — Other Ambulatory Visit: Payer: Self-pay

## 2021-12-17 MED ORDER — DIAZEPAM 5 MG PO TABS: ORAL_TABLET | ORAL | 0 refills | Status: DC

## 2021-12-17 NOTE — Telephone Encounter (Signed)
Pt's granddaughter called in stating [facility] in [place] didn't have a prescription for the pt's MRI medication. She is now going out of town and needs someone else to pick it up. She would like to see if it can be sent to [facility]?

## 2021-12-19 ENCOUNTER — Ambulatory Visit
Admission: RE | Admit: 2021-12-19 | Discharge: 2021-12-19 | Disposition: A | Discharge status: Home / Self Care | Payer: Medicare Other | Source: Ambulatory Visit | Attending: Physician Assistant | Admitting: Physician Assistant

## 2021-12-19 ENCOUNTER — Ambulatory Visit (HOSPITAL_COMMUNITY)
Admission: RE | Admit: 2021-12-19 | Discharge: 2021-12-19 | Disposition: A | Discharge status: Home / Self Care | Payer: Medicare Other | Source: Ambulatory Visit | Attending: Family Medicine | Admitting: Family Medicine

## 2021-12-19 DIAGNOSIS — R4020 Unspecified coma: Secondary | ICD-10-CM | POA: Insufficient documentation

## 2021-12-19 NOTE — [place] Notes (Signed)
EEG complete - results pending 
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
## 2021-12-26 ENCOUNTER — Ambulatory Visit: Payer: Medicare Other | Admitting: Physician Assistant

## 2021-12-26 ENCOUNTER — Encounter: Payer: Self-pay | Admitting: Physician Assistant

## 2021-12-26 VITALS — BP 111/72 | HR 59 | Resp 18 | Ht 59.0 in

## 2021-12-26 DIAGNOSIS — G309 Alzheimer's disease, unspecified: Secondary | ICD-10-CM

## 2021-12-26 DIAGNOSIS — F0150 Vascular dementia without behavioral disturbance: Secondary | ICD-10-CM

## 2021-12-26 DIAGNOSIS — F0280 Dementia in other diseases classified elsewhere without behavioral disturbance: Secondary | ICD-10-CM | POA: Diagnosis not present
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
## 2021-12-26 NOTE — Patient Instructions (Addendum)
It was a pleasure to see you today at our office.   Recommendations:  Neurocognitive evaluation at our office Talk with your doctor about psychotherapy   Start Memantine 10 mg: Take 1 tablet (10 mg at night) for 2 weeks, then increase to 1 tablet (10 mg) twice a day.    Follow up in  6 months   Whom to call:  Memory  decline, memory medications: Call our office [phone]   For psychiatric meds, mood meds: Please have your primary care physician manage these medications.   Counseling regarding caregiver distress, including caregiver depression, anxiety and issues regarding community resources, adult day care programs, adult living facilities, or memory care questions:   Feel free to contact [name] Lisabeth Register, Social Worker at [phone]   For assessment of decision of mental capacity and competency:  Call Dr. Erick Blinks, geriatric psychiatrist at [phone]  For guidance in geriatric dementia issues please call [facility] [phone]  For guidance regarding [facility] and if placement were needed at the facility, contact Sidney Ace, Social Worker tel: [phone]  If you have any severe symptoms of a stroke, or other severe issues such as confusion,severe chills or fever, etc call 911 or go to the ER as you may need to be evaluated further   Consider [facility]  [address], [place] [address] [phone]  Hours of Operation Mondays to Thursdays: 8 am to 8 pm,Fridays: 9 am to 8 pm, Saturdays: 9 am to 1 pm Sundays: Closed  [email or web address]      RECOMMENDATIONS FOR ALL PATIENTS WITH MEMORY PROBLEMS: 1. Continue to exercise (Recommend 30 minutes of walking everyday, or 3 hours every week) 2. Increase social interactions - continue going to [place] and enjoy social gatherings with friends and family 3. Eat healthy, avoid  fried foods and eat more fruits and vegetables 4. Maintain adequate blood pressure, blood sugar, and blood cholesterol level. Reducing the risk of stroke and cardiovascular disease also helps promoting better memory. 5. Avoid stressful situations. Live a simple life and avoid aggravations. Organize your time and prepare for the next day in anticipation. 6. Sleep well, avoid any interruptions of sleep and avoid any distractions in the bedroom that may interfere with adequate sleep quality 7. Avoid sugar, avoid sweets as there is a strong link between excessive sugar intake, diabetes, and cognitive impairment We discussed the Mediterranean diet, which has been shown to help patients reduce the risk of progressive memory disorders and reduces cardiovascular risk. This includes eating fish, eat fruits and green leafy vegetables, nuts like almonds and hazelnuts, walnuts, and also use olive oil. Avoid fast foods and fried foods as much as possible. Avoid sweets and sugar as sugar use has been linked to worsening of memory function.  There is always a concern of gradual progression of memory problems. If this is the case, then we may need to adjust level of care according to patient needs. Support, both to the patient and caregiver, should then be put into place.      You have been referred for a neuropsychological evaluation (i.e., evaluation of memory and thinking abilities). Please bring someone with you to this appointment if possible, as it is helpful for the doctor to hear from both you and another adult who knows you well. Please bring eyeglasses and hearing aids if you wear them.    The evaluation will take approximately 3 hours and has two parts:   The first part  is a clinical interview with the neuropsychologist (Dr. Milbert Coulter or Dr. Roseanne Reno). During the interview, the neuropsychologist will speak with you and the individual you brought to the appointment.    The second part of the evaluation is  testing with the doctor's technician Annabelle Harman or Selena Batten). During the testing, the technician will ask you to remember different types of material, solve problems, and answer some questionnaires. Your family member will not be present for this portion of the evaluation.   Please note: We must reserve several hours of the neuropsychologist's time and the psychometrician's time for your evaluation appointment. As such, there is a No-Show fee of $100. If you are unable to attend any of your appointments, please contact our office as soon as possible to reschedule.    FALL PRECAUTIONS: Be cautious when walking. Scan the area for obstacles that may increase the risk of trips and falls. When getting up in the mornings, sit up at the edge of the bed for a few minutes before getting out of bed. Consider elevating the bed at the head end to avoid drop of blood pressure when getting up. Walk always in a well-lit room (use night lights in the walls). Avoid area rugs or power cords from appliances in the middle of the walkways. Use a walker or a cane if necessary and consider physical therapy for balance exercise. Get your eyesight checked regularly.  FINANCIAL OVERSIGHT: Supervision, especially oversight when making financial decisions or transactions is also recommended.  HOME SAFETY: Consider the safety of the kitchen when operating appliances like stoves, microwave oven, and blender. Consider having supervision and share cooking responsibilities until no longer able to participate in those. Accidents with firearms and other hazards in the house should be identified and addressed as well.   ABILITY TO BE LEFT ALONE: If patient is unable to contact 911 operator, consider using LifeLine, or when the need is there, arrange for someone to stay with patients. Smoking is a fire hazard, consider supervision or cessation. Risk of wandering should be assessed by caregiver and if detected at any point, supervision and safe proof  recommendations should be instituted.  MEDICATION SUPERVISION: Inability to self-administer medication needs to be constantly addressed. Implement a mechanism to ensure safe administration of the medications.   DRIVING: Regarding driving, in patients with progressive memory problems, driving will be impaired. We advise to have someone else do the driving if trouble finding directions or if minor accidents are reported. Independent driving assessment is available to determine safety of driving.   If you are interested in the driving assessment, you can contact the following:  [facility] [employer] [facility] in [place] [phone]  [facility] [phone]  [facility] [phone] or [phone]    Mediterranean Diet A Mediterranean diet refers to food and lifestyle choices that are based on the traditions of countries located on the [employer]. This way of eating has been shown to help prevent certain conditions and improve outcomes for people who have chronic diseases, like kidney disease and heart disease. What are tips for following this plan? Lifestyle  Cook and eat meals together with your family, when possible. Drink enough fluid to keep your urine clear or pale yellow. Be physically active every day. This includes: Aerobic exercise like running or swimming. Leisure activities like gardening, walking, or housework. Get 7-8 hours of sleep each night. If recommended by your health care provider, drink red wine in moderation. This means 1 glass a  day for nonpregnant women and 2 glasses a day for men. A glass of wine equals 5 oz (150 mL). Reading food labels  Check the serving size of packaged foods. For foods such as rice and pasta, the serving size refers to the amount of cooked product, not dry. Check the total fat in packaged foods. Avoid foods that have saturated fat or trans fats. Check the ingredients list for  added sugars, such as corn syrup. Shopping  At the grocery store, buy most of your food from the areas near the walls of the store. This includes: Fresh fruits and vegetables (produce). Grains, beans, nuts, and seeds. Some of these may be available in unpackaged forms or large amounts (in bulk). Fresh seafood. Poultry and eggs. Low-fat dairy products. Buy whole ingredients instead of prepackaged foods. Buy fresh fruits and vegetables in-season from local farmers markets. Buy frozen fruits and vegetables in resealable bags. If you do not have access to quality fresh seafood, buy precooked frozen shrimp or canned fish, such as tuna, salmon, or sardines. Buy small amounts of raw or cooked vegetables, salads, or olives from the deli or salad bar at your store. Stock your pantry so you always have certain foods on hand, such as olive oil, canned tuna, canned tomatoes, rice, pasta, and beans. Cooking  Cook foods with extra-virgin olive oil instead of using butter or other vegetable oils. Have meat as a side dish, and have vegetables or grains as your main dish. This means having meat in small portions or adding small amounts of meat to foods like pasta or stew. Use beans or vegetables instead of meat in common dishes like chili or lasagna. Experiment with different cooking methods. Try roasting or broiling vegetables instead of steaming or sauteing them. Add frozen vegetables to soups, stews, pasta, or rice. Add nuts or seeds for added healthy fat at each meal. You can add these to yogurt, salads, or vegetable dishes. Marinate fish or vegetables using olive oil, lemon juice, garlic, and fresh herbs. Meal planning  Plan to eat 1 vegetarian meal one day each week. Try to work up to 2 vegetarian meals, if possible. Eat seafood 2 or more times a week. Have healthy snacks readily available, such as: Vegetable sticks with hummus. Greek yogurt. Fruit and nut trail mix. Eat balanced meals throughout  the week. This includes: Fruit: 2-3 servings a day Vegetables: 4-5 servings a day Low-fat dairy: 2 servings a day Fish, poultry, or lean meat: 1 serving a day Beans and legumes: 2 or more servings a week Nuts and seeds: 1-2 servings a day Whole grains: 6-8 servings a day Extra-virgin olive oil: 3-4 servings a day Limit red meat and sweets to only a few servings a month What are my food choices? Mediterranean diet Recommended Grains: Whole-grain pasta. Brown rice. Bulgar wheat. Polenta. Couscous. Whole-wheat bread. Orpah Cobb. Vegetables: Artichokes. Beets. Broccoli. Cabbage. Carrots. Eggplant. Green beans. Chard. Kale. Spinach. Onions. Leeks. Peas. Squash. Tomatoes. Peppers. Radishes. Fruits: Apples. Apricots. Avocado. Berries. Bananas. Cherries. Dates. Figs. Grapes. Lemons. Melon. Oranges. Peaches. Plums. Pomegranate. Meats and other protein foods: Beans. Almonds. Sunflower seeds. Pine nuts. Peanuts. Cod. Salmon. Scallops. Shrimp. Tuna. Tilapia. Clams. Oysters. Eggs. Dairy: Low-fat milk. Cheese. Greek yogurt. Beverages: Water. Red wine. Herbal tea. Fats and oils: Extra virgin olive oil. Avocado oil. Grape seed oil. Sweets and desserts: [place] yogurt with honey. Baked apples. Poached pears. Trail mix. Seasoning and other foods: Basil. Cilantro. Coriander. Cumin. Mint. Parsley. Sage. Rosemary. Tarragon. Garlic. Oregano. Thyme.  Pepper. Balsalmic vinegar. Tahini. Hummus. Tomato sauce. Olives. Mushrooms. Limit these Grains: Prepackaged pasta or rice dishes. Prepackaged cereal with added sugar. Vegetables: Deep fried potatoes (french fries). Fruits: Fruit canned in syrup. Meats and other protein foods: Beef. Pork. Lamb. Poultry with skin. Hot dogs. Tomasa Blase. Dairy: Ice cream. Sour cream. Whole milk. Beverages: Juice. Sugar-sweetened soft drinks. Beer. Liquor and spirits. Fats and oils: Butter. Canola oil. Vegetable oil. Beef fat (tallow). Lard. Sweets and desserts: Cookies. Cakes. Pies.  Candy. Seasoning and other foods: Mayonnaise. Premade sauces and marinades. The items listed may not be a complete list. Talk with your dietitian about what dietary choices are right for you. Summary The Mediterranean diet includes both food and lifestyle choices. Eat a variety of fresh fruits and vegetables, beans, nuts, seeds, and whole grains. Limit the amount of red meat and sweets that you eat. Talk with your health care provider about whether it is safe for you to drink red wine in moderation. This means 1 glass a day for nonpregnant women and 2 glasses a day for men. A glass of wine equals 5 oz (150 mL). This information is not intended to replace advice given to you by your health care provider. Make sure you discuss any questions you have with your health care provider. Document Released: 01/23/2016 Document Revised: 02/25/2016 Document Reviewed: 01/23/2016 Elsevier Interactive Patient Education  2017 [employer].

## 2021-12-29 ENCOUNTER — Telehealth: Payer: Self-pay | Admitting: Physician Assistant

## 2021-12-29 ENCOUNTER — Other Ambulatory Visit: Payer: Self-pay

## 2021-12-29 MED ORDER — MEMANTINE HCL 10 MG PO TABS
ORAL_TABLET | ORAL | 11 refills | Status: DC
Start: 2021-12-29 — End: 2023-04-07

## 2021-12-29 NOTE — Progress Notes (Signed)
Please inform the patient that the EEG is normal, no seizure activity seen.  Thank

## 2021-12-29 NOTE — Telephone Encounter (Addendum)
Patient's granddaughter called and said the pharmacy does not have the prescriptions for patient's Vascular Dementa medicine.  [facility] in [place]

## 2022-02-28 IMAGING — CR DG CHEST 1V PORT
1 series · 1 of 1 positions shown · non-contrast
Comparison: 01/17/2020

CLINICAL DATA: Pt c/o CP, numbness weakness and tingling x3 weeks,
having dizziness and burning sensation to R leg as well as nausea.VP
shunt in place for skull images

EXAM:
PORTABLE CHEST 1 VIEW

[chest ap]
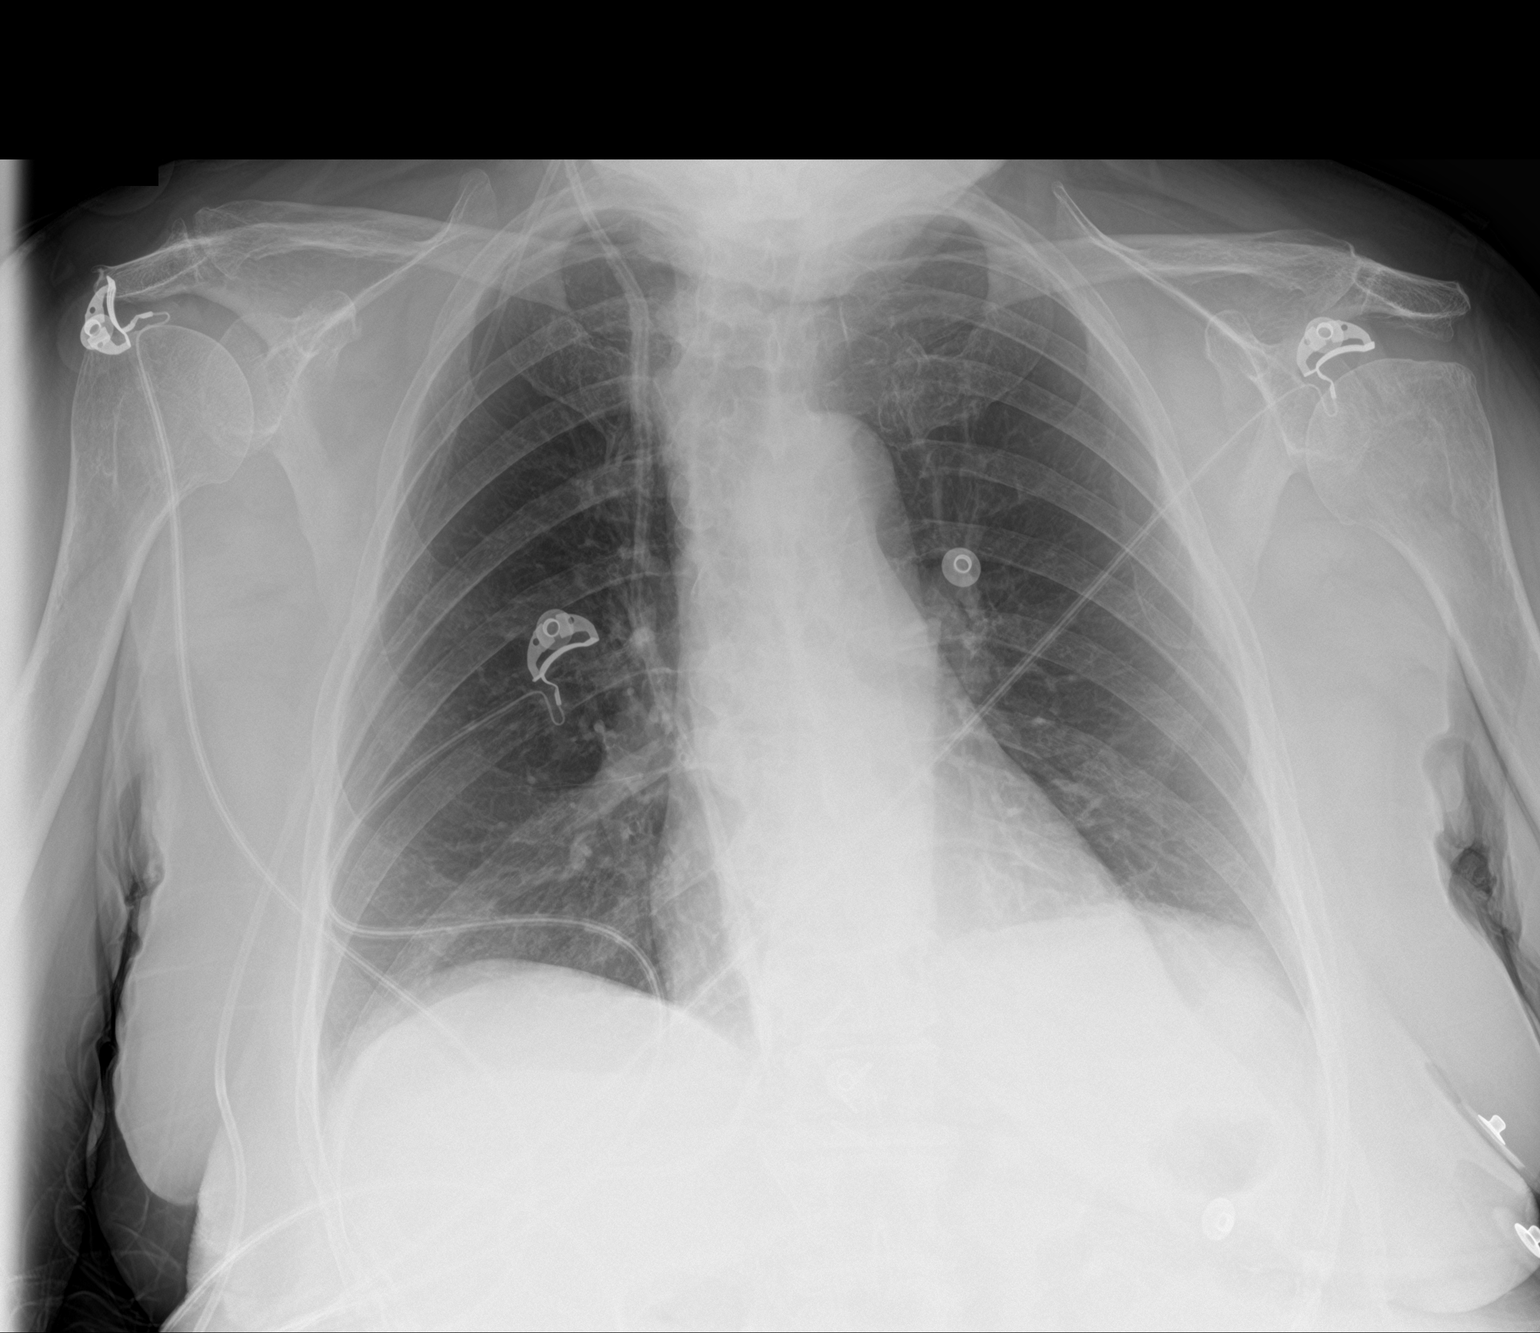

[1 of 1 positions shown; findings below may reference images not displayed]

FINDINGS: Cardiac silhouette normal in size. No mediastinal or hilar masses or
evidence of adenopathy. Lungs are clear.

Two ventriculostomy catheters extend from the right neck, 1 across
the more central chest other across the more lateral chest, both
stable from the prior chest radiograph.

No acute skeletal abnormality.
IMPRESSION: 1. No acute cardiopulmonary disease.
2. Visualized portions shunt catheters appear intact and are
unchanged from the prior chest radiograph

## 2022-02-28 IMAGING — MR MR CERVICAL SPINE W/O CM
4 of 6 series · 19 of 48 positions shown · non-contrast
Comparison: 11/01/[address] report

CLINICAL DATA: Cervical radiculopathy. Right arm and leg weakness
for 2 weeks

EXAM:
MRI CERVICAL SPINE WITHOUT CONTRAST
TECHNIQUE: Multiplanar, multisequence MR imaging of the cervical spine was
performed. No intravenous contrast was administered.

[Series 3: T2 · sagittal · 3.0mm · 0.39mm/px · 4 of 16 slices shown (1 of 2)]
[im 1/16]
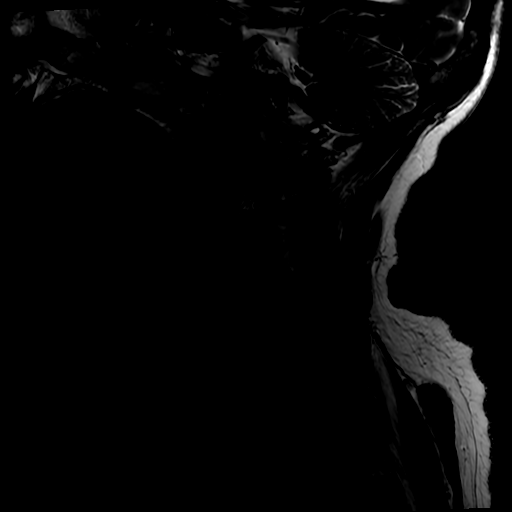
[im 6/16]
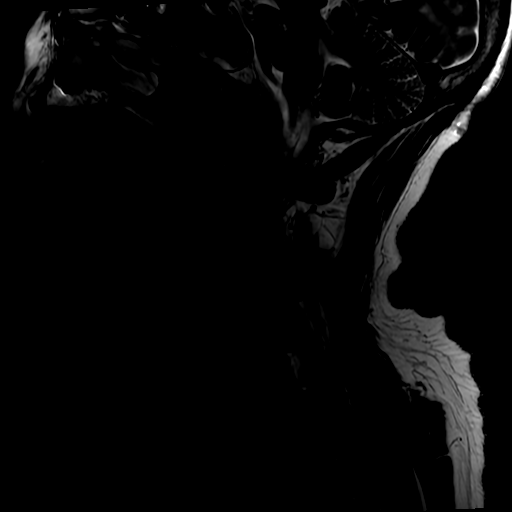
[im 11/16]
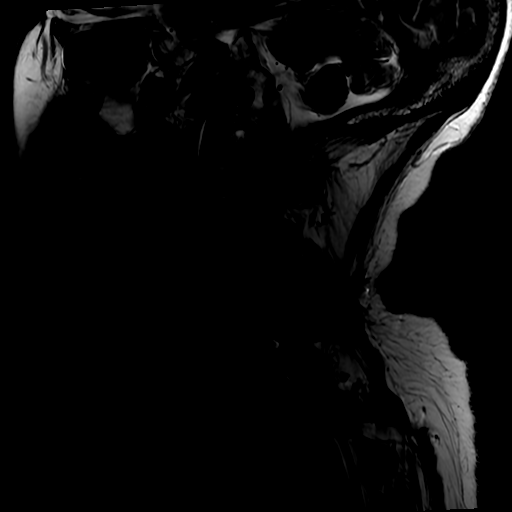
[im 16/16]
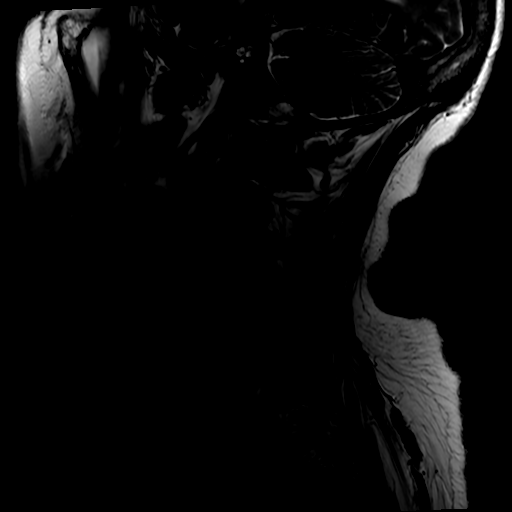

[Series 5: STIR · sagittal · 3.0mm · 0.39mm/px · 3 of 16 slices shown]
[im 1/16]
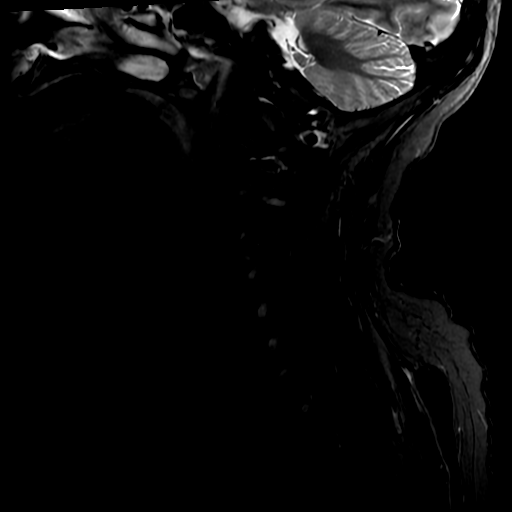
[im 8/16]
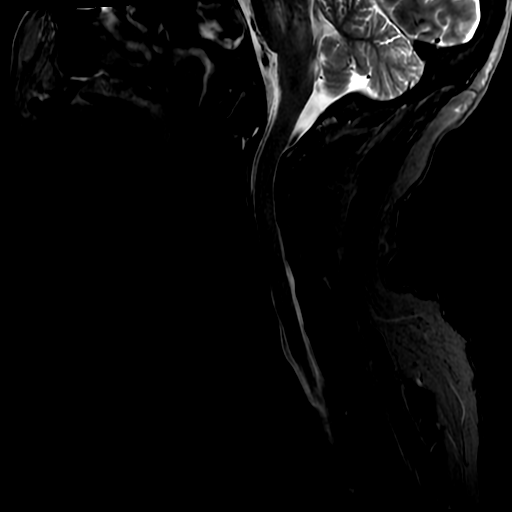
[im 16/16]
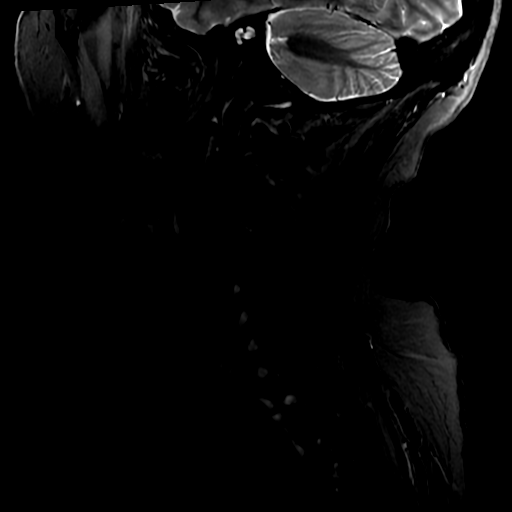

[Series 7: T2 · axial · 3.0mm · 0.35mm/px · z∈[-237,-122]mm · 8 of 41 slices shown (2 of 2)]
[im 1/41]
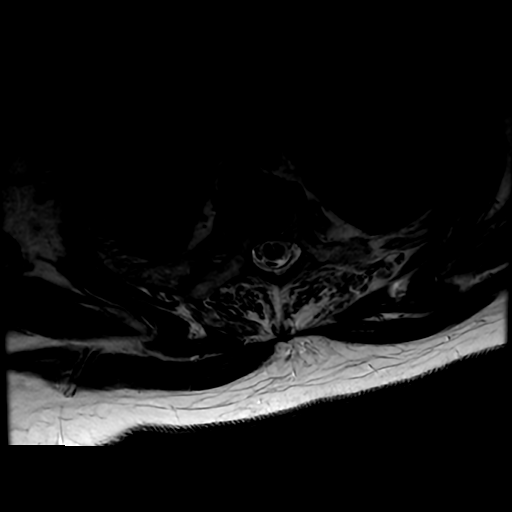
[im 6/41]
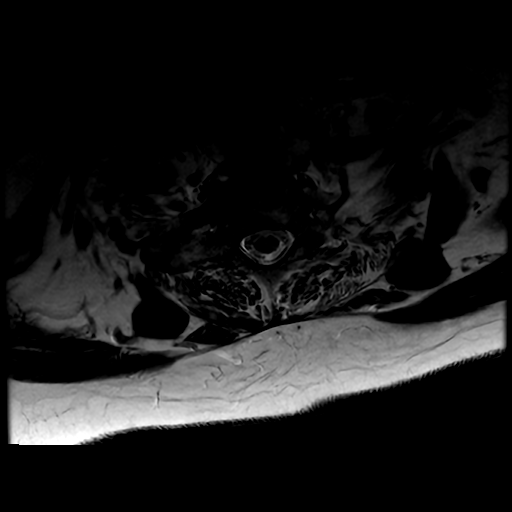
[im 12/41]
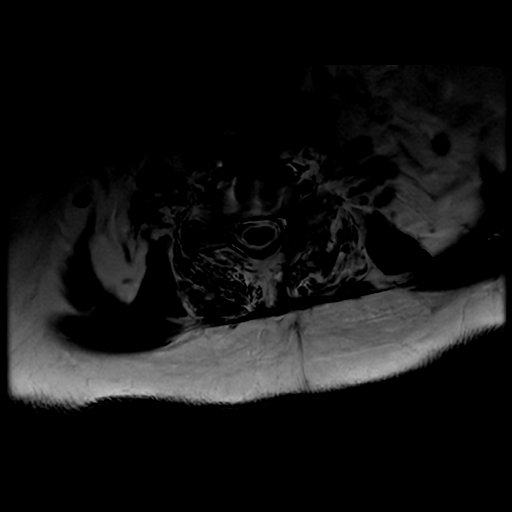
[im 18/41]
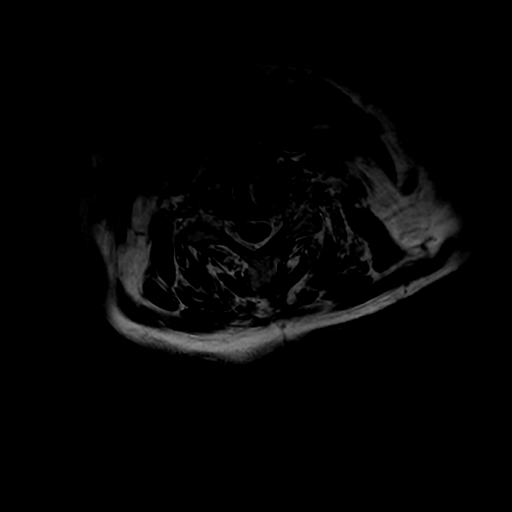
[im 23/41]
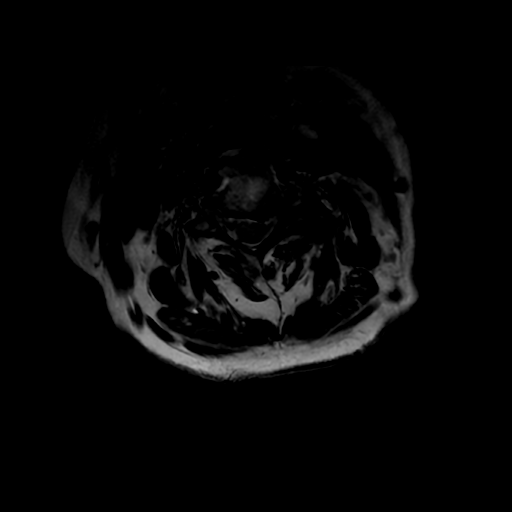
[im 29/41]
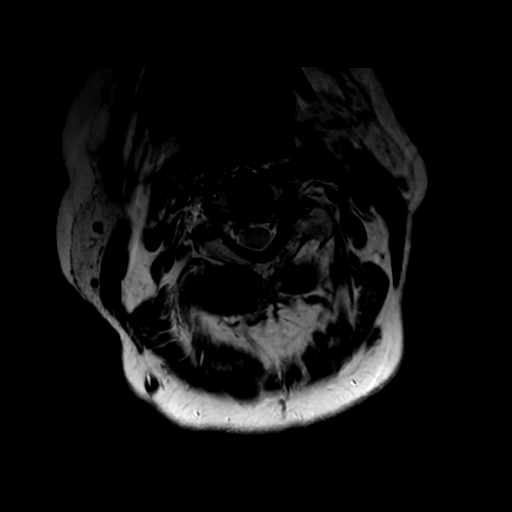
[im 35/41]
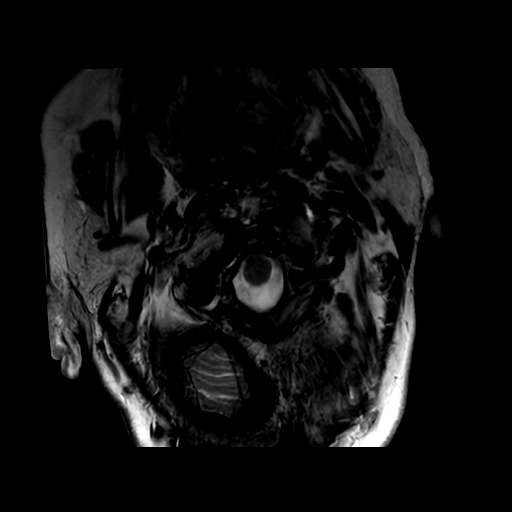
[im 41/41]
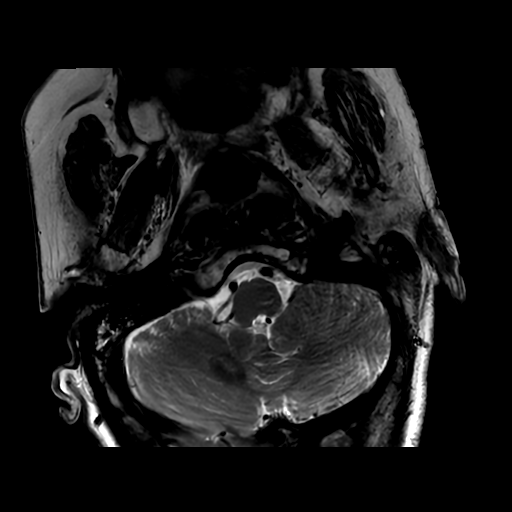

[Series 8: T1 · axial · non-contrast · 3.0mm · 0.35mm/px · z∈[-241,-143]mm · 4 of 41 slices shown]
[im 1/41]
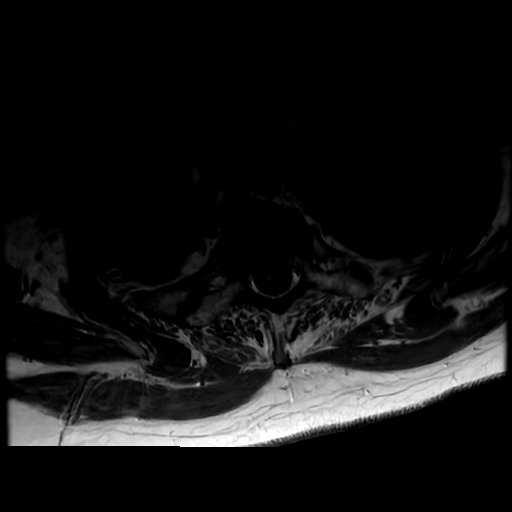
[im 6/41]
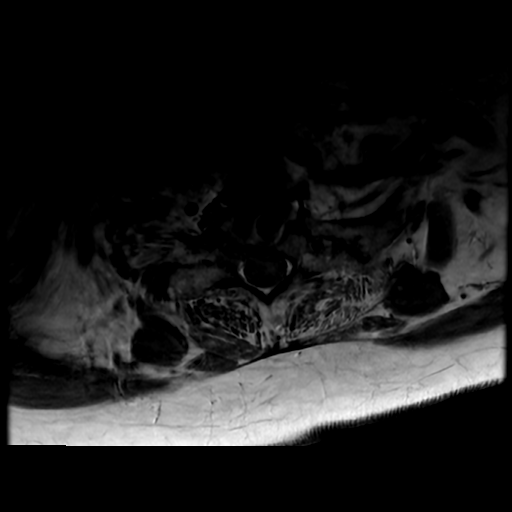
[im 23/41]
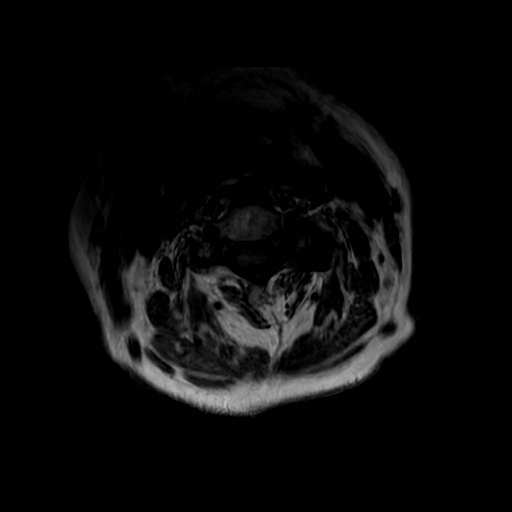
[im 35/41]
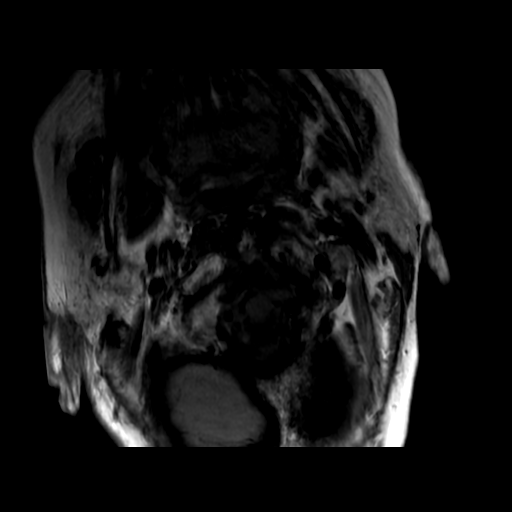

[19 of 48 positions shown; findings below may reference images not displayed]

FINDINGS: Alignment: Physiologic.

Vertebrae: No acute fracture, evidence of discitis, or bone lesion.
Postoperative changes are described below. Somewhat distorted
appearance of the base of dens which could be from ganglion
formation or healed fracture.

Cord: Normal signal and morphology.

Posterior Fossa, vertebral arteries, paraspinal tissues: Negative.

Disc levels:

C1-2: Degenerative facet spurring on the right more than left.

C2-3: Hypertrophic facets with ankylosis on the left. Uncovertebral
ridging on the left. Mild left foraminal narrowing

C3-4: Facet spurring which is bulky on the left. Asymmetric left
uncovertebral spurring and foraminal impingement. Right foraminal
narrowing that is mild based on gradient imaging. Patent canal.

C4-5: Degenerative facet spurring asymmetric to the left. Left
foraminal narrowing is mild to moderate. Patent canal

C5-6: ACDF with solid arthrodesis and no impingement

C6-7: ACDF with solid arthrodesis and no impingement

C7-T1:Unremarkable.
IMPRESSION: 1. Facet osteoarthritis asymmetric to the left at C2-3 to C4-5, with
C2-3 ankylosis. Facet and uncovertebral spurring causes left
foraminal impingement at C3-4 and mild-to-moderate left foraminal
narrowing C4-5.
2. C5-C7 ACDF with solid arthrodesis and no impingement.
3. Up to mild foraminal narrowing on the symptomatic right side at
C3-4. Diffusely patent spinal canal.

## 2022-02-28 IMAGING — MR MR HEAD W/O CM
7 of 11 series · 25 of 48 positions shown · non-contrast
Comparison: Head CT from earlier today.

CLINICAL DATA: Neuro deficit with stroke suspected

EXAM:
MRI HEAD WITHOUT CONTRAST
TECHNIQUE: Multiplanar, multiecho pulse sequences of the brain and surrounding
structures were obtained without intravenous contrast.

[Series 2: DWI · axial · 3.0mm · 0.94mm/px · z∈[-106,+39]mm · 7 of 100 slices shown (1 of 2)]
[im 1/100]
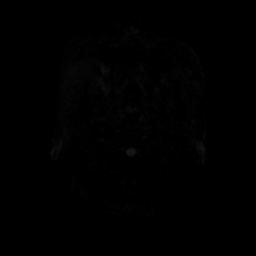
[im 17/100]
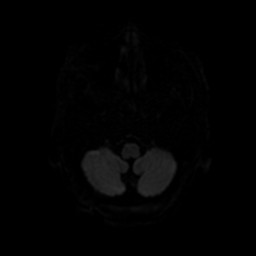
[im 34/100]
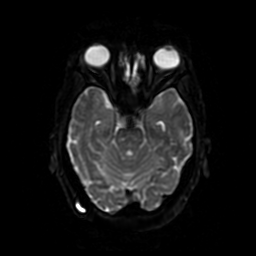
[im 50/100]
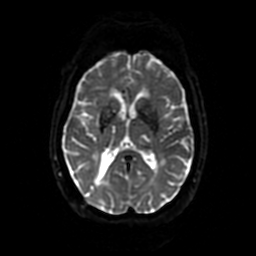
[im 67/100]
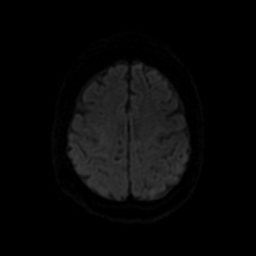
[im 83/100]
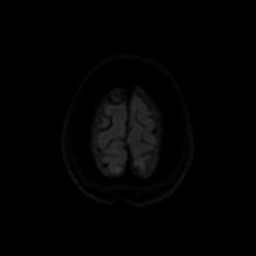
[im 100/100]
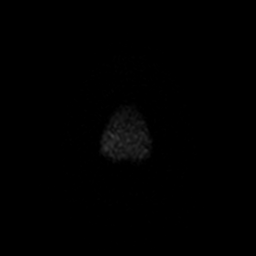

[Series 3: DWI · coronal · 4.0mm · 0.94mm/px · 6 of 76 slices shown (2 of 2)]
[im 1/76]
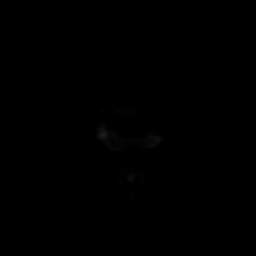
[im 16/76]
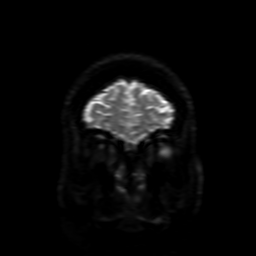
[im 31/76]
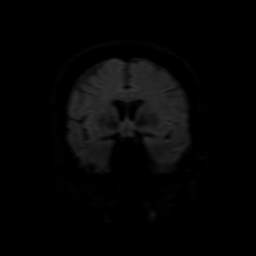
[im 46/76]
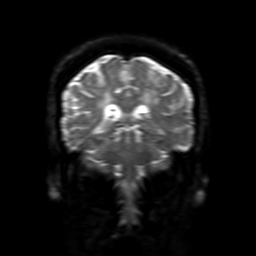
[im 61/76]
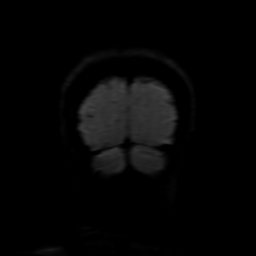
[im 76/76]
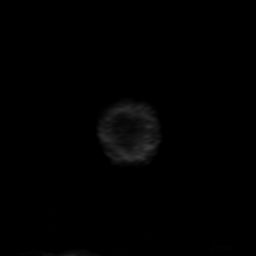

[Series 4: FLAIR · sagittal · 5.0mm · 0.23mm/px · 2 of 25 slices shown (1 of 2)]
[im 1/25]
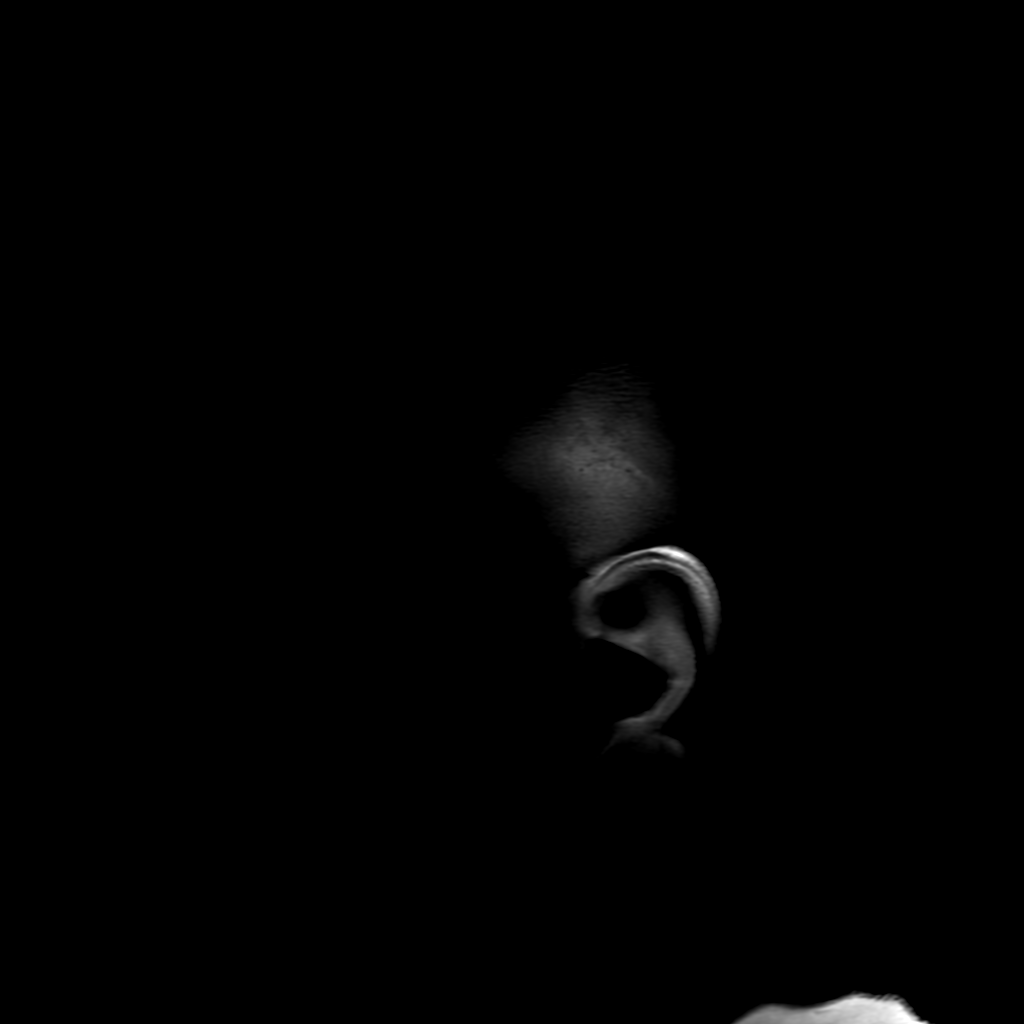
[im 25/25]
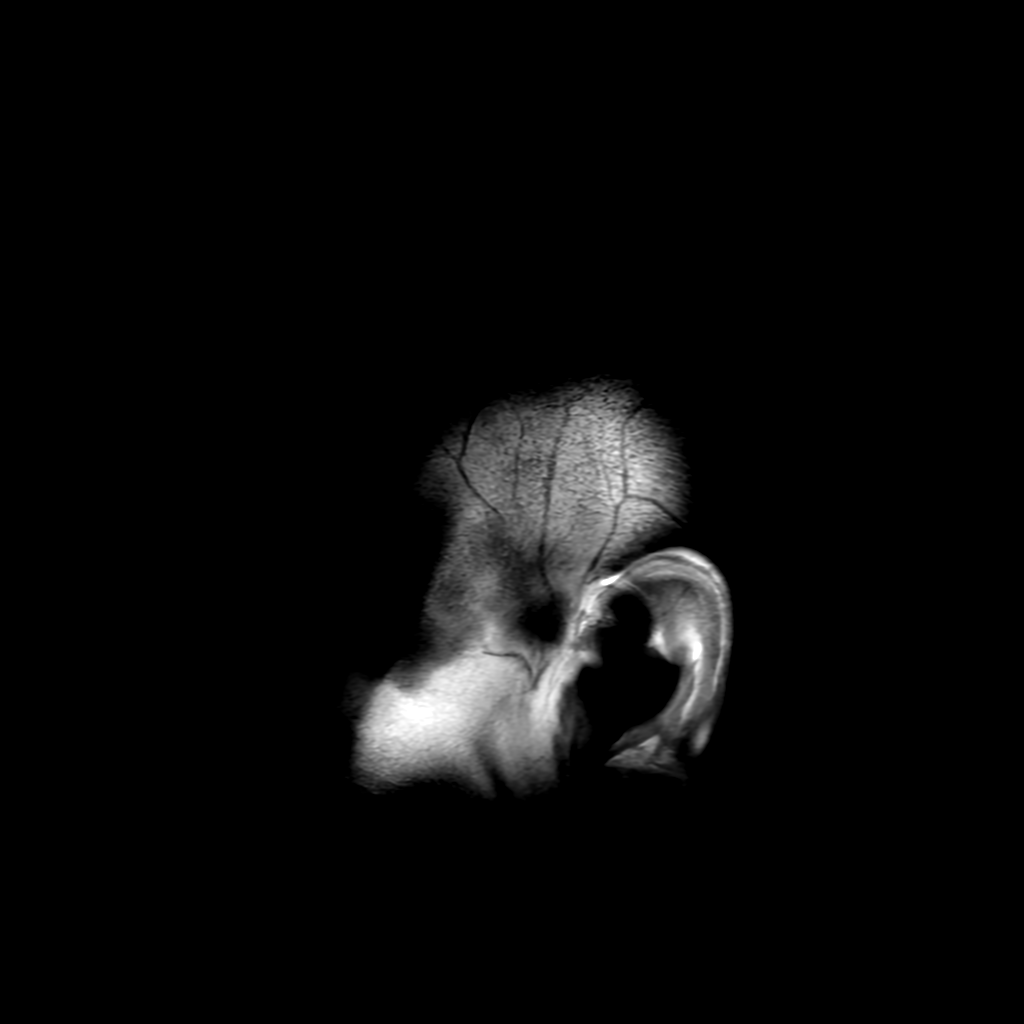

[Series 5: T2 · axial · 5.0mm · 0.23mm/px · 1 of 26 slices shown]
[im 1/26]
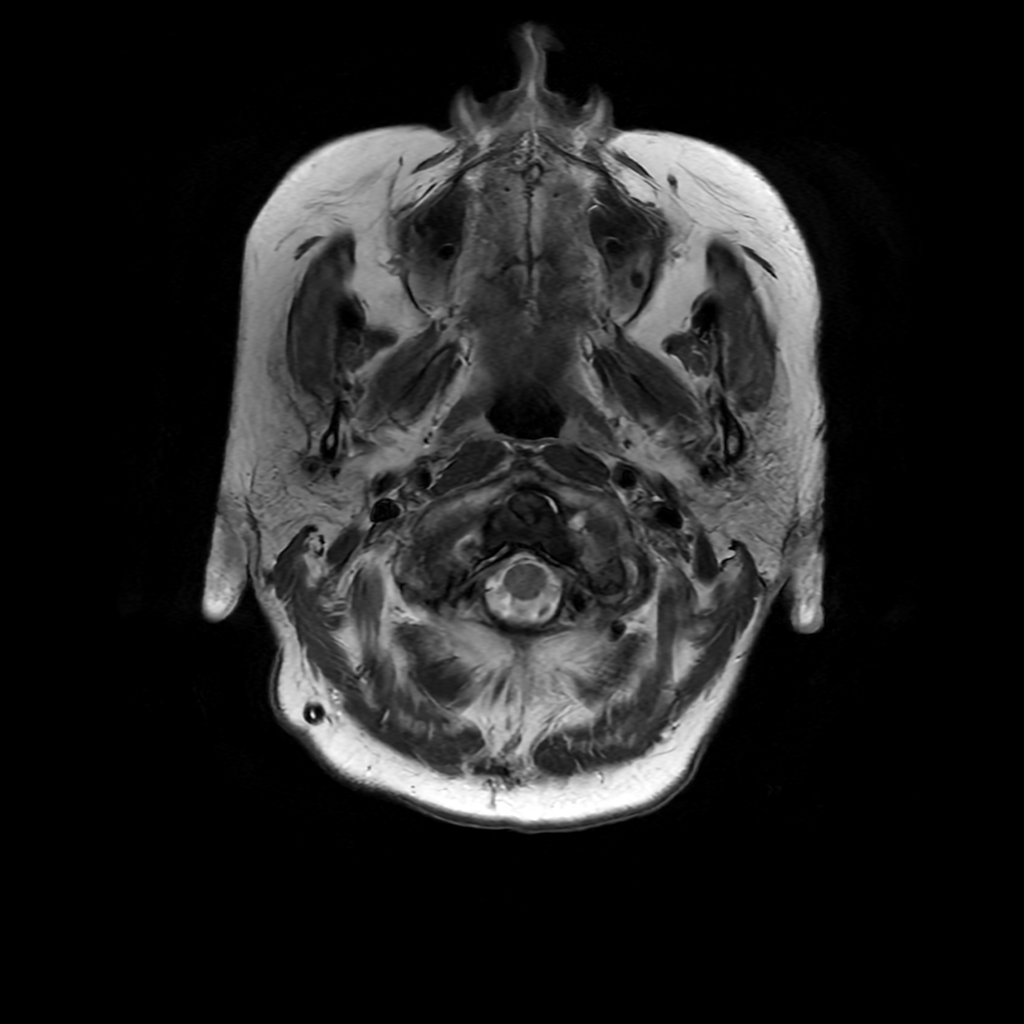

[Series 6: FLAIR · axial · 3.0mm · 0.41mm/px · z∈[-102,+41]mm · 2 of 25 slices shown (2 of 2)]
[im 1/25]
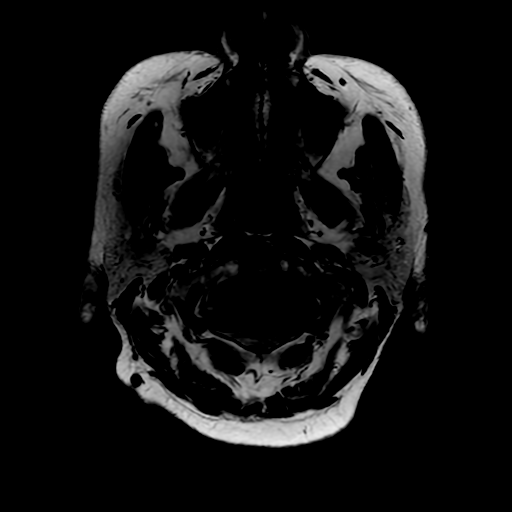
[im 25/25]
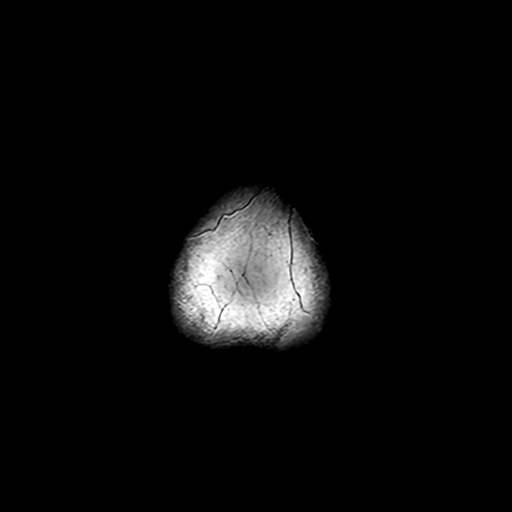

[Series 250: ADC · axial · 3.0mm · 0.94mm/px · z∈[-106,+39]mm · 4 of 50 slices shown (1 of 2)]
[im 1/50]
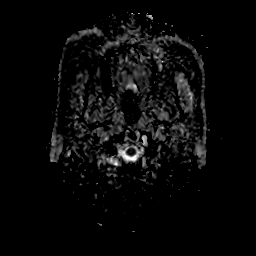
[im 17/50]
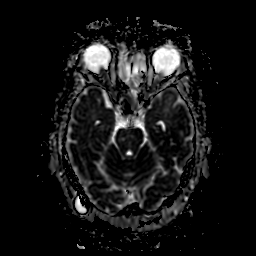
[im 33/50]
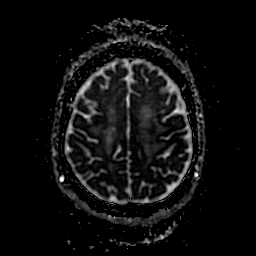
[im 50/50]
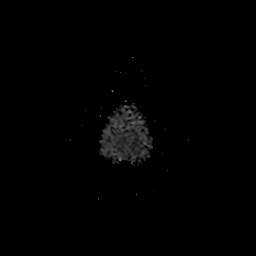

[Series 350: ADC · coronal · 4.0mm · 0.94mm/px · 3 of 37 slices shown (2 of 2)]
[im 1/37]
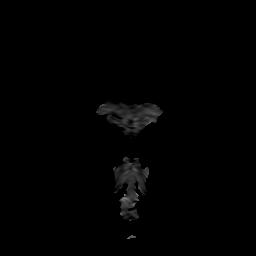
[im 19/37]
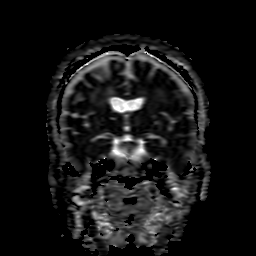
[im 37/37]
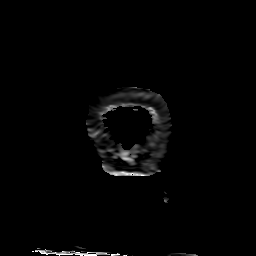

[25 of 48 positions shown; findings below may reference images not displayed]

FINDINGS: Brain: No acute infarction, hemorrhage, hydrocephalus, or mass.

Right parietal approach ventriculostomy catheter with no
ventriculomegaly or ventricular effacement.

Confluent FLAIR hyperintensity in the cerebral white matter, also
moderately extensive with indistinct appearance in the pons. Chronic
lacunes at the corpus callosum and deep gray nuclei. Brain volume is
normal. Generalized extra-axial signal abnormality which is likely
dural thickening but could also be thin collections measuring no
more than 2 mm in thickness. These cause no brain mass effect and
are chronic when compared with prior CTs.

Vascular: Preserved flow voids

Skull and upper cervical spine: Normal marrow signal. There is retro
dental ligamentous thickening and degenerative facet ankylosis at
C2-3.

Sinuses/Orbits: Overall mild patchy mucosal thickening in paranasal
sinuses, mainly frontal and left posterior ethmoid.
IMPRESSION: 1. No acute finding, including infarct.
2. VP shunt with no ventriculomegaly. Although the ventricles are
not slit like, there is generalized dural thickening or trace
collections which is chronic.
3. Advanced chronic small vessel disease.

## 2022-05-21 DIAGNOSIS — I340 Nonrheumatic mitral (valve) insufficiency: Secondary | ICD-10-CM

## 2022-05-21 DIAGNOSIS — I361 Nonrheumatic tricuspid (valve) insufficiency: Secondary | ICD-10-CM

## 2022-06-29 ENCOUNTER — Ambulatory Visit: Payer: Medicare Other | Admitting: Physician Assistant

## 2022-08-14 ENCOUNTER — Encounter: Payer: Medicare Other | Admitting: Psychology

## 2022-08-21 ENCOUNTER — Encounter: Payer: Medicare Other | Admitting: Psychology

## 2022-08-28 ENCOUNTER — Ambulatory Visit: Payer: Medicare Other | Admitting: Physician Assistant

## 2023-01-24 ENCOUNTER — Emergency Department
Admission: EM | Admit: 2023-01-24 | Discharge: 2023-01-24 | Disposition: A | Discharge status: Home / Self Care | Payer: Medicare Other | Attending: Emergency Medicine | Admitting: Emergency Medicine

## 2023-01-24 ENCOUNTER — Emergency Department: Payer: Medicare Other

## 2023-01-24 ENCOUNTER — Other Ambulatory Visit: Payer: Self-pay

## 2023-01-24 DIAGNOSIS — W06XXXA Fall from bed, initial encounter: Secondary | ICD-10-CM | POA: Insufficient documentation

## 2023-01-24 DIAGNOSIS — I509 Heart failure, unspecified: Secondary | ICD-10-CM | POA: Insufficient documentation

## 2023-01-24 DIAGNOSIS — Y92129 Unspecified place in nursing home as the place of occurrence of the external cause: Secondary | ICD-10-CM | POA: Diagnosis not present

## 2023-01-24 DIAGNOSIS — I110 Hypertensive heart disease with heart failure: Secondary | ICD-10-CM | POA: Diagnosis not present

## 2023-01-24 DIAGNOSIS — I679 Cerebrovascular disease, unspecified: Secondary | ICD-10-CM

## 2023-01-24 DIAGNOSIS — F0390 Unspecified dementia without behavioral disturbance: Secondary | ICD-10-CM | POA: Diagnosis not present

## 2023-01-24 DIAGNOSIS — S0990XA Unspecified injury of head, initial encounter: Secondary | ICD-10-CM | POA: Diagnosis not present

## 2023-01-24 HISTORY — DX: Cerebrovascular disease, unspecified: I67.9

## 2023-01-24 HISTORY — DX: Chronic kidney disease, unspecified: N18.9

## 2023-01-24 HISTORY — DX: Vascular dementia, unspecified severity, without behavioral disturbance, psychotic disturbance, mood disturbance, and anxiety: F01.50

## 2023-01-24 HISTORY — DX: Atherosclerotic heart disease of native coronary artery without angina pectoris: I25.10

## 2023-01-24 HISTORY — DX: Heart failure, unspecified: I50.9

## 2023-01-24 NOTE — ED Triage Notes (Signed)
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

## 2023-01-24 NOTE — ED Provider Notes (Signed)
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 female with a past medical history of prior CVA, hypertension, CHF, vascular dementia, VP shunt, presents to the emergency department for a fall.  According to the patient she was at her nursing facility when she was reaching for the TV remote and slid out of bed.  Patient states she did hit her head when she fell out of bed.  States very mild headache currently.  Denies any other injuries denies any other pain.  Patient is awake alert able to give a good history.  Physical Exam   Triage Vital Signs: ED Triage Vitals  Encounter Vitals Group     BP 01/24/23 1016 (!) 140/49     Systolic BP Percentile --      Diastolic BP Percentile --      Pulse Rate 01/24/23 1016 70     Resp 01/24/23 1016 17     Temp 01/24/23 1016 99 F (37.2 C)     Temp Source 01/24/23 1016 Oral     SpO2 01/24/23 1016 95 %     Weight 01/24/23 1017 143 lb 1.3 oz (64.9 kg)     Height 01/24/23 1017 4\' 11"  (1.499 m)     Head Circumference --      Peak Flow --      Pain Score 01/24/23 1017 8     Pain Loc --      Pain Education --      Exclude from Growth Chart --     Most recent vital signs: Vitals:   01/24/23 1016  BP: (!) 140/49  Pulse: 70  Resp: 17  Temp: 99 F (37.2 C)  SpO2: 95%    General: Awake, no distress.  CV:  Good peripheral perfusion.  Regular rate and rhythm  Resp:  Normal effort.  Equal breath sounds bilaterally.  Abd:  No distention.  Soft,  nontender.  No rebound or guarding.  ED Results / Procedures / Treatments   MEDICATIONS ORDERED IN ED: Medications - No data to display   IMPRESSION / MDM / ASSESSMENT AND PLAN / ED COURSE  I reviewed the triage vital signs and the nursing notes.  Patient's presentation is most consistent with acute presentation with potential threat to life or bodily function.  Patient presents to the emergency department after a fall out of bed.  Patient states it was a mild fall she fell to the ground while trying to reach for the TV remote.  She did hit her head however and has a history of a VP shunt so they sent her to the emergency department for evaluation.  Overall the patient appears well.  Denies any pain besides a slight headache.  Patient has great range of motion all extremities.  No chest or abdominal tenderness to palpation.  We will obtain CT imaging of the head and C-spine as a precaution.  As long as CT scans  appear well anticipate likely discharge home.  I have reviewed and interpreted the CT head images.  Patient has a right-sided VP shunt but I do not see any acute abnormality. Radiology has read the CT as no acute traumatic injury chronic CSF shunt with larger ventricles.  Chronically apparent cerebral white matter disease.  CT cervical spine is negative.  Given the patient's reassuring CT imaging we will discharge from the emergency department with PCP follow-up as needed.  Patient agreeable to plan.  FINAL CLINICAL IMPRESSION(S) / ED DIAGNOSES   Fall Head injury   Note:  This document was prepared using Dragon voice recognition software and may include unintentional dictation errors.   Minna Antis, MD 01/24/23 1204

## 2023-02-28 ENCOUNTER — Emergency Department
Admission: EM | Admit: 2023-02-28 | Discharge: 2023-02-28 | Disposition: A | Discharge status: Home / Self Care | Payer: Medicare Other | Attending: Emergency Medicine | Admitting: Emergency Medicine

## 2023-02-28 ENCOUNTER — Other Ambulatory Visit: Payer: Self-pay

## 2023-02-28 ENCOUNTER — Encounter: Payer: Self-pay | Admitting: Emergency Medicine

## 2023-02-28 ENCOUNTER — Emergency Department: Payer: Medicare Other

## 2023-02-28 DIAGNOSIS — S0990XA Unspecified injury of head, initial encounter: Secondary | ICD-10-CM | POA: Insufficient documentation

## 2023-02-28 DIAGNOSIS — Y92003 Bedroom of unspecified non-institutional (private) residence as the place of occurrence of the external cause: Secondary | ICD-10-CM | POA: Insufficient documentation

## 2023-02-28 DIAGNOSIS — W19XXXA Unspecified fall, initial encounter: Secondary | ICD-10-CM

## 2023-02-28 DIAGNOSIS — W06XXXA Fall from bed, initial encounter: Secondary | ICD-10-CM | POA: Diagnosis not present

## 2023-02-28 MED ORDER — ACETAMINOPHEN 500 MG PO TABS
1000.0000 mg | ORAL_TABLET | Freq: Once | ORAL | Status: AC
  Administered 2023-02-28: 1000 mg via ORAL
  Filled 2023-02-28: qty 2

## 2023-02-28 NOTE — ED Notes (Signed)
See triage note. States she slid out of bed this am  States she hit her head  Having some pain to left temporal area  No LOC

## 2023-02-28 NOTE — ED Provider Notes (Signed)
[facility] Provider Note    Event Date/Time   First MD Initiated Contact with Patient 02/28/23 1153     (approximate)   History   Fall   HPI  Kristen Campbell is a 77 y.o. female presents to the emergency department after having a fall at her nursing facility.  Reaching for something and fell out of bed hitting her head on the left side.  No change in vision.  Does not take anything for pain.  Denies any extremity numbness or weakness.  Denies any chest pain, shortness of breath, dizziness or weakness.  Denies any burning with urination or symptoms of urinary tract infection.     Physical Exam   Triage Vital Signs: ED Triage Vitals  Encounter Vitals Group     BP 02/28/23 1124 131/83     Systolic BP Percentile --      Diastolic BP Percentile --      Pulse Rate 02/28/23 1121 68     Resp 02/28/23 1121 17     Temp 02/28/23 1121 97.8 F (36.6 C)     Temp Source 02/28/23 1121 Oral     SpO2 02/28/23 1121 99 %     Weight 02/28/23 1122 150 lb (68 kg)     Height 02/28/23 1122 (!) 5" (0.127 m)     Head Circumference --      Peak Flow --      Pain Score 02/28/23 1122 5     Pain Loc --      Pain Education --      Exclude from Growth Chart --     Most recent vital signs: Vitals:   02/28/23 1121 02/28/23 1124  BP:  131/83  Pulse: 68   Resp: 17   Temp: 97.8 F (36.6 C)   SpO2: 99%     Physical Exam HENT:     Head: Atraumatic.     Comments: Mild tenderness to the left forehead    Right Ear: External ear normal.     Left Ear: External ear normal.  Eyes:     Extraocular Movements: Extraocular movements intact.     Pupils: Pupils are equal, round, and reactive to light.  Cardiovascular:     Rate and Rhythm: Normal rate.  Pulmonary:     Effort: No respiratory distress.  Abdominal:     General: There is no distension.  Musculoskeletal:        General: No tenderness. Normal range of motion.     Cervical back: No tenderness.  Skin:     Capillary Refill: Capillary refill takes less than 2 seconds.  Neurological:     General: No focal deficit present.     Mental Status: She is alert.      IMPRESSION / MDM / ASSESSMENT AND PLAN / ED COURSE  I reviewed the triage vital signs and the nursing notes.  Differential diagnosis including concussion, skull fracture, intracranial hemorrhage, cervical fracture   RADIOLOGY I independently reviewed imaging, my interpretation of imaging: CT scan of the head with VP shunt in place but no signs of intracranial hemorrhage or infarction.  No signs of a skull fracture.  CT scan of the cervical spine with no acute fracture or dislocation.   Labs (all labs ordered are listed, but only abnormal results are displayed) Labs interpreted as -    Labs Reviewed - No data to display    Tylenol for pain control.  Discussed return precautions for any worsening symptoms.  No other signs of trauma.  Discharged back to nursing facility.   PROCEDURES:  Critical Care performed: No  Procedures  Patient's presentation is most consistent with acute presentation with potential threat to life or bodily function.   MEDICATIONS ORDERED IN ED: Medications  acetaminophen (TYLENOL) tablet 1,000 mg (has no administration in time range)    FINAL CLINICAL IMPRESSION(S) / ED DIAGNOSES   Final diagnoses:  Fall, initial encounter  Injury of head, initial encounter     Rx / DC Orders   ED Discharge Orders     None        Note:  This document was prepared using Dragon voice recognition software and may include unintentional dictation errors.   Corena Herter, MD 02/28/23 1226

## 2023-02-28 NOTE — ED Notes (Signed)
First nurse note: Pt here via [facility] with c/o of fall, pt from [place] [facility] [address] Fall unwitnessed. Pt has slight brusie to L side of head. VSS per EMS.

## 2023-02-28 NOTE — ED Triage Notes (Signed)
Pt arrives via EMS from the [facility] and sts that she went to reach for something while laying in bed and fell out of it and hit her head on the bedside table. Pt sts that she did not have any LOC. Pt sts that she is not dizzy and does not have any blurred vision. Pt is not on a blood thinners. A/Ox4

## 2023-02-28 NOTE — Discharge Instructions (Signed)
You are seen in the emergency department following a fall with head injury.  Your CT scan of your head and your neck did not show any broken bones or internal bleeding.  You can take Tylenol as needed for pain control.

## 2023-03-08 ENCOUNTER — Emergency Department
Admission: EM | Admit: 2023-03-08 | Discharge: 2023-03-08 | Disposition: A | Discharge status: Home / Self Care | Payer: Medicare Other | Attending: Emergency Medicine | Admitting: Emergency Medicine

## 2023-03-08 ENCOUNTER — Emergency Department: Payer: Medicare Other

## 2023-03-08 ENCOUNTER — Other Ambulatory Visit: Payer: Self-pay

## 2023-03-08 DIAGNOSIS — I10 Essential (primary) hypertension: Secondary | ICD-10-CM | POA: Insufficient documentation

## 2023-03-08 DIAGNOSIS — Z8673 Personal history of transient ischemic attack (TIA), and cerebral infarction without residual deficits: Secondary | ICD-10-CM | POA: Diagnosis not present

## 2023-03-08 DIAGNOSIS — W06XXXA Fall from bed, initial encounter: Secondary | ICD-10-CM | POA: Insufficient documentation

## 2023-03-08 DIAGNOSIS — S0990XA Unspecified injury of head, initial encounter: Secondary | ICD-10-CM | POA: Diagnosis not present

## 2023-03-08 DIAGNOSIS — R519 Headache, unspecified: Secondary | ICD-10-CM | POA: Diagnosis present

## 2023-03-08 DIAGNOSIS — W19XXXA Unspecified fall, initial encounter: Secondary | ICD-10-CM

## 2023-03-08 MED ORDER — ACETAMINOPHEN 325 MG PO TABS
650.0000 mg | ORAL_TABLET | Freq: Once | ORAL | Status: AC
  Administered 2023-03-08: 650 mg via ORAL
  Filled 2023-03-08: qty 2

## 2023-03-08 NOTE — ED Triage Notes (Signed)
Pt comes via EMS from [place] [facility] [place] with c/o fall. Pt rolled out of bed and fell on her face. Pt states no loc or thinners. Pt is A*OX3 and as baseline.

## 2023-03-08 NOTE — ED Provider Notes (Signed)
[facility] Provider Note    Event Date/Time   First MD Initiated Contact with Patient 03/08/23 1224     (approximate)   History   Fall   HPI  Kristen Campbell is a 77 y.o. female with a past medical history of CVA, hyperlipidemia, seizure disorder, VP shunt who presents today for evaluation of head strike after she fell out of her bed.  Patient reports that she was reaching for something when she rolled out of bed and hit her head on the floor.  There was no loss of consciousness.  She reports that she has been able to ambulate since it happened.  She reports that she was on the ground for only a couple of seconds.  She has not had any nausea or vomiting.  She reports that she has a headache.  No hip pain, extremity pain, chest pain, shortness of breath, or abdominal pain.  She denies any visual changes.  She does not take anticoagulation.  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   Cerebral embolism with cerebral infarction (HCC) 12/12/2014   Caregiver with fatigue 09/09/2014   Adjustment disorder with depressed mood 09/09/2014   Chest pain 02/11/2013   Seizure disorder (HCC) 02/11/2013          Physical Exam   Triage Vital Signs: ED Triage Vitals  Encounter Vitals Group     BP 03/08/23 1130 (!) 147/73     Systolic BP Percentile --      Diastolic BP Percentile --      Pulse Rate 03/08/23 1130 70     Resp 03/08/23 1130 17     Temp 03/08/23 1130 97.8 F (36.6 C)      Temp Source 03/08/23 1130 Oral     SpO2 03/08/23 1130 100 %     Weight --      Height --      Head Circumference --      Peak Flow --      Pain Score 03/08/23 1150 3     Pain Loc --      Pain Education --      Exclude from Growth Chart --     Most recent vital signs: Vitals:   03/08/23 1130  BP: (!) 147/73  Pulse: 70  Resp: 17  Temp: 97.8 F (36.6 C)  SpO2: 100%    Physical Exam Vitals and nursing note reviewed.  Constitutional:      General: Awake and alert. No acute distress.    Appearance: Normal appearance. The patient is normal weight.  HENT:     Head: Normocephalic and atraumatic.     Mouth: Mucous membranes are moist.  Eyes:     General: PERRL. Normal EOMs        Right eye: No discharge.        Left eye: No discharge.     Conjunctiva/sclera: Conjunctivae normal.  Cardiovascular:     Rate and Rhythm: Normal rate and regular rhythm.     Pulses:  Normal pulses.     Heart sounds: Normal heart sounds Pulmonary:     Effort: Pulmonary effort is normal. No respiratory distress.     Breath sounds: Normal breath sounds.  Abdominal:     Abdomen is soft. There is no abdominal tenderness. No rebound or guarding. No distention. Musculoskeletal:        General: No swelling. Normal range of motion.     Cervical back: Normal range of motion and neck supple. No midline cervical spine tenderness.  Full range of motion of neck.  Negative Spurling test.  Negative Lhermitte sign.  Normal strength and sensation in bilateral upper extremities. Normal grip strength bilaterally.  Normal intrinsic muscle function of the hand bilaterally.  Normal radial pulses bilaterally. Skin:    General: Skin is warm and dry.     Capillary Refill: Capillary refill takes less than 2 seconds.     Findings: No rash.  Neurological:     Mental Status: The patient is awake and alert.  Neurological: GCS 15 alert and oriented x3 Normal speech, no expressive or receptive aphasia or  dysarthria Cranial nerves II through XII intact Normal visual fields 5 out of 5 strength in all 4 extremities with intact sensation throughout No extremity drift Normal finger-to-nose testing, no limb or truncal ataxia     ED Results / Procedures / Treatments   Labs (all labs ordered are listed, but only abnormal results are displayed) Labs Reviewed - No data to display   EKG     RADIOLOGY I independently reviewed and interpreted imaging and agree with radiologists findings.     PROCEDURES:  Critical Care performed:   Procedures   MEDICATIONS ORDERED IN ED: Medications  acetaminophen (TYLENOL) tablet 650 mg (650 mg Oral Given 03/08/23 1341)     IMPRESSION / MDM / ASSESSMENT AND PLAN / ED COURSE  I reviewed the triage vital signs and the nursing notes.   Differential diagnosis includes, but is not limited to, contusion, concussion, intracranial hemorrhage, skull fracture, cervical spine injury.  Patient is awake and alert, hemodynamically stable and afebrile.  She is alert and oriented x 3.  She is at her mental baseline.  She has no focal neurological deficits.  Given her age, CT head and neck obtained.  She has no complaints elsewhere.  She is able to ambulate with a steady gait.  There is no evidence of injury elsewhere.  CT head and neck are normal.  Patient is reassured these findings.  She was treated with Tylenol with improvement of her symptoms.  Recommended close outpatient follow-up and return precautions.  Patient understands and agrees with plan.  She was discharged in stable condition.   Patient's presentation is most consistent with acute complicated illness / injury requiring diagnostic workup.     FINAL CLINICAL IMPRESSION(S) / ED DIAGNOSES   Final diagnoses:  Injury of head, initial encounter  Fall, initial encounter     Rx / DC Orders   ED Discharge Orders     None        Note:  This document was prepared using Dragon voice  recognition software and may include unintentional dictation errors.   Keturah Shavers 03/08/23 1430    Trinna Post, MD 03/09/23 [phone]

## 2023-03-08 NOTE — Discharge Instructions (Signed)
Your CT scans were normal.  Please follow-up with your outpatient provider.  Please return for any new, worsening, or change in symptoms or other concerns.  It was a pleasure caring for you today.

## 2023-04-07 ENCOUNTER — Encounter: Payer: Self-pay | Admitting: Family Medicine

## 2023-04-07 ENCOUNTER — Inpatient Hospital Stay
Admission: EM | Admit: 2023-04-07 | Discharge: 2023-04-12 | DRG: 682 | Disposition: A | Discharge status: Skilled Nursing Facility | Payer: Medicare Other | Source: Skilled Nursing Facility | Attending: Internal Medicine | Admitting: Internal Medicine

## 2023-04-07 ENCOUNTER — Observation Stay: Payer: Medicare Other

## 2023-04-07 ENCOUNTER — Other Ambulatory Visit: Payer: Self-pay

## 2023-04-07 ENCOUNTER — Emergency Department: Payer: Medicare Other

## 2023-04-07 DIAGNOSIS — G9340 Encephalopathy, unspecified: Secondary | ICD-10-CM

## 2023-04-07 DIAGNOSIS — Z791 Long term (current) use of non-steroidal anti-inflammatories (NSAID): Secondary | ICD-10-CM

## 2023-04-07 DIAGNOSIS — F32A Depression, unspecified: Secondary | ICD-10-CM | POA: Diagnosis present

## 2023-04-07 DIAGNOSIS — Z888 Allergy status to other drugs, medicaments and biological substances status: Secondary | ICD-10-CM

## 2023-04-07 DIAGNOSIS — I10 Essential (primary) hypertension: Secondary | ICD-10-CM | POA: Diagnosis present

## 2023-04-07 DIAGNOSIS — R471 Dysarthria and anarthria: Secondary | ICD-10-CM | POA: Diagnosis present

## 2023-04-07 DIAGNOSIS — I129 Hypertensive chronic kidney disease with stage 1 through stage 4 chronic kidney disease, or unspecified chronic kidney disease: Secondary | ICD-10-CM | POA: Diagnosis present

## 2023-04-07 DIAGNOSIS — G8929 Other chronic pain: Secondary | ICD-10-CM | POA: Diagnosis present

## 2023-04-07 DIAGNOSIS — I959 Hypotension, unspecified: Secondary | ICD-10-CM

## 2023-04-07 DIAGNOSIS — Z982 Presence of cerebrospinal fluid drainage device: Secondary | ICD-10-CM

## 2023-04-07 DIAGNOSIS — N179 Acute kidney failure, unspecified: Principal | ICD-10-CM

## 2023-04-07 DIAGNOSIS — Z8673 Personal history of transient ischemic attack (TIA), and cerebral infarction without residual deficits: Secondary | ICD-10-CM

## 2023-04-07 DIAGNOSIS — E875 Hyperkalemia: Secondary | ICD-10-CM | POA: Diagnosis not present

## 2023-04-07 DIAGNOSIS — R29810 Facial weakness: Secondary | ICD-10-CM | POA: Diagnosis present

## 2023-04-07 DIAGNOSIS — D72819 Decreased white blood cell count, unspecified: Secondary | ICD-10-CM | POA: Diagnosis not present

## 2023-04-07 DIAGNOSIS — F0390 Unspecified dementia without behavioral disturbance: Secondary | ICD-10-CM | POA: Diagnosis not present

## 2023-04-07 DIAGNOSIS — G9341 Metabolic encephalopathy: Secondary | ICD-10-CM | POA: Diagnosis present

## 2023-04-07 DIAGNOSIS — Z6831 Body mass index (BMI) 31.0-31.9, adult: Secondary | ICD-10-CM

## 2023-04-07 DIAGNOSIS — R531 Weakness: Secondary | ICD-10-CM

## 2023-04-07 DIAGNOSIS — F0150 Vascular dementia without behavioral disturbance: Secondary | ICD-10-CM | POA: Diagnosis present

## 2023-04-07 DIAGNOSIS — D696 Thrombocytopenia, unspecified: Secondary | ICD-10-CM | POA: Diagnosis not present

## 2023-04-07 DIAGNOSIS — E669 Obesity, unspecified: Secondary | ICD-10-CM | POA: Diagnosis present

## 2023-04-07 DIAGNOSIS — I2510 Atherosclerotic heart disease of native coronary artery without angina pectoris: Secondary | ICD-10-CM | POA: Diagnosis present

## 2023-04-07 DIAGNOSIS — M79604 Pain in right leg: Secondary | ICD-10-CM | POA: Diagnosis present

## 2023-04-07 DIAGNOSIS — Z885 Allergy status to narcotic agent status: Secondary | ICD-10-CM

## 2023-04-07 DIAGNOSIS — Z79899 Other long term (current) drug therapy: Secondary | ICD-10-CM

## 2023-04-07 DIAGNOSIS — N1832 Chronic kidney disease, stage 3b: Secondary | ICD-10-CM | POA: Diagnosis present

## 2023-04-07 DIAGNOSIS — R633 Feeding difficulties, unspecified: Secondary | ICD-10-CM | POA: Diagnosis present

## 2023-04-07 DIAGNOSIS — N189 Chronic kidney disease, unspecified: Secondary | ICD-10-CM

## 2023-04-07 LAB — DIFFERENTIAL
Abs Immature Granulocytes: 0.03 10*3/uL (ref 0.00–0.07)
Basophils Absolute: 0 10*3/uL (ref 0.0–0.1)
Basophils Relative: 0 %
Eosinophils Absolute: 0.1 10*3/uL (ref 0.0–0.5)
Eosinophils Relative: 2 %
Immature Granulocytes: 1 %
Lymphocytes Relative: 18 %
Lymphs Abs: 0.8 10*3/uL (ref 0.7–4.0)
Monocytes Absolute: 0.5 10*3/uL (ref 0.1–1.0)
Monocytes Relative: 11 %
Neutro Abs: 3.3 10*3/uL (ref 1.7–7.7)
Neutrophils Relative %: 68 %

## 2023-04-07 LAB — URINE DRUG SCREEN, QUALITATIVE (ARMC ONLY)
Amphetamines, Ur Screen: NOT DETECTED
Barbiturates, Ur Screen: NOT DETECTED
Benzodiazepine, Ur Scrn: NOT DETECTED
Cannabinoid 50 Ng, Ur ~~LOC~~: NOT DETECTED
Cocaine Metabolite,Ur ~~LOC~~: NOT DETECTED
MDMA (Ecstasy)Ur Screen: NOT DETECTED
Methadone Scn, Ur: NOT DETECTED
Opiate, Ur Screen: POSITIVE — AB
Phencyclidine (PCP) Ur S: NOT DETECTED
Tricyclic, Ur Screen: NOT DETECTED

## 2023-04-07 LAB — URINALYSIS, ROUTINE W REFLEX MICROSCOPIC
Bilirubin Urine: NEGATIVE
Glucose, UA: 50 mg/dL — AB
Hgb urine dipstick: NEGATIVE
Ketones, ur: NEGATIVE mg/dL
Leukocytes,Ua: NEGATIVE
Nitrite: NEGATIVE
Protein, ur: NEGATIVE mg/dL
Specific Gravity, Urine: 1.008 (ref 1.005–1.030)
pH: 7 (ref 5.0–8.0)

## 2023-04-07 LAB — APTT: aPTT: 25 s (ref 24–36)

## 2023-04-07 LAB — COMPREHENSIVE METABOLIC PANEL
ALT: 10 U/L (ref 0–44)
AST: 14 U/L — ABNORMAL LOW (ref 15–41)
Albumin: 3.9 g/dL (ref 3.5–5.0)
Alkaline Phosphatase: 129 U/L — ABNORMAL HIGH (ref 38–126)
Anion gap: 7 (ref 5–15)
BUN: 94 mg/dL — ABNORMAL HIGH (ref 8–23)
CO2: 23 mmol/L (ref 22–32)
Calcium: 8.8 mg/dL — ABNORMAL LOW (ref 8.9–10.3)
Chloride: 108 mmol/L (ref 98–111)
Creatinine, Ser: 3.12 mg/dL — ABNORMAL HIGH (ref 0.44–1.00)
GFR, Estimated: 15 mL/min — ABNORMAL LOW (ref >60)
Glucose, Bld: 103 mg/dL — ABNORMAL HIGH (ref 70–99)
Potassium: 5.7 mmol/L — ABNORMAL HIGH (ref 3.5–5.1)
Sodium: 138 mmol/L (ref 135–145)
Total Bilirubin: 0.6 mg/dL (ref 0.3–1.2)
Total Protein: 7.4 g/dL (ref 6.5–8.1)

## 2023-04-07 LAB — TROPONIN I (HIGH SENSITIVITY)
Troponin I (High Sensitivity): 12 ng/L (ref <18)
Troponin I (High Sensitivity): 13 ng/L (ref <18)

## 2023-04-07 LAB — ETHANOL: Alcohol, Ethyl (B): <10 mg/dL (ref <10)

## 2023-04-07 LAB — CBC
HCT: 30.8 % — ABNORMAL LOW (ref 36.0–46.0)
Hemoglobin: 9.5 g/dL — ABNORMAL LOW (ref 12.0–15.0)
MCH: 27.5 pg (ref 26.0–34.0)
MCHC: 30.8 g/dL (ref 30.0–36.0)
MCV: 89.3 fL (ref 80.0–100.0)
Platelets: 160 10*3/uL (ref 150–400)
RBC: 3.45 MIL/uL — ABNORMAL LOW (ref 3.87–5.11)
RDW: 14.4 % (ref 11.5–15.5)
WBC: 4.8 10*3/uL (ref 4.0–10.5)
nRBC: 0 % (ref 0.0–0.2)

## 2023-04-07 LAB — PROTIME-INR
INR: 1.1 (ref 0.8–1.2)
Prothrombin Time: 14.3 s (ref 11.4–15.2)

## 2023-04-07 LAB — SODIUM, URINE, RANDOM: Sodium, Ur: 79 mmol/L

## 2023-04-07 LAB — BASIC METABOLIC PANEL
Anion gap: 9 (ref 5–15)
BUN: 84 mg/dL — ABNORMAL HIGH (ref 8–23)
CO2: 24 mmol/L (ref 22–32)
Calcium: 8.5 mg/dL — ABNORMAL LOW (ref 8.9–10.3)
Chloride: 108 mmol/L (ref 98–111)
Creatinine, Ser: 2.65 mg/dL — ABNORMAL HIGH (ref 0.44–1.00)
GFR, Estimated: 18 mL/min — ABNORMAL LOW (ref >60)
Glucose, Bld: 97 mg/dL (ref 70–99)
Potassium: 4.4 mmol/L (ref 3.5–5.1)
Sodium: 141 mmol/L (ref 135–145)

## 2023-04-07 LAB — CREATININE, URINE, RANDOM: Creatinine, Urine: 32 mg/dL

## 2023-04-07 LAB — CK: Total CK: 69 U/L (ref 38–234)

## 2023-04-07 LAB — CBG MONITORING, ED: Glucose-Capillary: 116 mg/dL — ABNORMAL HIGH (ref 70–99)

## 2023-04-07 MED ORDER — SODIUM ZIRCONIUM CYCLOSILICATE 10 G PO PACK
10.0000 g | PACK | Freq: Once | ORAL | Status: DC
  Filled 2023-04-07: qty 1

## 2023-04-07 MED ORDER — HYDROMORPHONE HCL 1 MG/ML IJ SOLN
0.5000 mg | INTRAMUSCULAR | Status: DC | PRN
  Administered 2023-04-07 – 2023-04-08 (×3): 0.5 mg via INTRAVENOUS
  Filled 2023-04-07 (×3): qty 0.5

## 2023-04-07 MED ORDER — SODIUM BICARBONATE 8.4 % IV SOLN
50.0000 meq | Freq: Once | INTRAVENOUS | Status: AC
  Administered 2023-04-07: 50 meq via INTRAVENOUS
  Filled 2023-04-07: qty 50

## 2023-04-07 MED ORDER — DEXTROSE 50 % IV SOLN
1.0000 | Freq: Once | INTRAVENOUS | Status: AC
  Administered 2023-04-07: 50 mL via INTRAVENOUS
  Filled 2023-04-07: qty 50

## 2023-04-07 MED ORDER — ENOXAPARIN SODIUM 30 MG/0.3ML IJ SOSY
30.0000 mg | PREFILLED_SYRINGE | INTRAMUSCULAR | Status: DC
Start: 2023-04-07 — End: 2023-04-12
  Administered 2023-04-07 – 2023-04-11 (×5): 30 mg via SUBCUTANEOUS
  Filled 2023-04-07 (×5): qty 0.3

## 2023-04-07 MED ORDER — SODIUM CHLORIDE 0.9 % IV BOLUS
1000.0000 mL | Freq: Once | INTRAVENOUS | Status: AC
  Administered 2023-04-07: 1000 mL via INTRAVENOUS

## 2023-04-07 MED ORDER — SODIUM CHLORIDE 0.9 % IV SOLN: INTRAVENOUS | Status: DC

## 2023-04-07 MED ORDER — INSULIN ASPART 100 UNIT/ML IV SOLN
5.0000 [IU] | Freq: Once | INTRAVENOUS | Status: AC
  Administered 2023-04-07: 5 [IU] via INTRAVENOUS
  Filled 2023-04-07: qty 0.05

## 2023-04-07 NOTE — Assessment & Plan Note (Signed)
BP stable Titrate home regimen 

## 2023-04-07 NOTE — ED Provider Notes (Addendum)
[facility] Provider Note    Event Date/Time   First MD Initiated Contact with Patient 04/07/23 [phone]     (approximate)   History   Weakness   HPI  Aili Anhalt is a 77 y.o. female some dementia who comes in with concerns for altered mental status.  Patient reportedly was normal around 730-745 when she took her medications.  However afterwards while at breakfast her roommate noticed that she was less responsive and was fumbling trying to use her spoon and was not able to speak.  Initially hypotensive with EMS  Physical Exam   Triage Vital Signs: ED Triage Vitals  Encounter Vitals Group     BP      Systolic BP Percentile      Diastolic BP Percentile      Pulse      Resp      Temp      Temp src      SpO2      Weight      Height      Head Circumference      Peak Flow      Pain Score      Pain Loc      Pain Education      Exclude from Growth Chart     Most recent vital signs: Vitals:   04/07/23 1000  BP: 120/62  Pulse: 68  Resp: 14  SpO2: 95%     General: Awake, no distress.  CV:  Good peripheral perfusion.  Resp:  Normal effort.  Abd:  No distention.  Other:  Slight facial droop noted on the left side with NIH stroke scale of 1.  Some slight difficulties with speech.   ED Results / Procedures / Treatments   Labs (all labs ordered are listed, but only abnormal results are displayed) Labs Reviewed  CBC - Abnormal; Notable for the following components:      Result Value   RBC 3.45 (*)    Hemoglobin 9.5 (*)    HCT 30.8 (*)    All other components within normal limits  PROTIME-INR  APTT  DIFFERENTIAL  ETHANOL  COMPREHENSIVE METABOLIC PANEL  URINE DRUG SCREEN, QUALITATIVE ([facility] ONLY)  URINALYSIS, ROUTINE W REFLEX MICROSCOPIC  TROPONIN I (HIGH SENSITIVITY)     EKG  My interpretation of EKG:  Normal sinus rate of 69 without any ST elevation or T wave inversions, normal intervals   RADIOLOGY I have reviewed the CT  personally interpreted and no evidence of intracranial hemorrhage  PROCEDURES:  Critical Care performed: Yes, see critical care procedure note(s)  .1-3 Lead EKG Interpretation  Performed by: Concha Se, MD Authorized by: Concha Se, MD     Interpretation: normal     ECG rate:  60   ECG rate assessment: normal     Rhythm: sinus rhythm     Ectopy: none     Conduction: normal   .Critical Care  Performed by: Concha Se, MD Authorized by: Concha Se, MD   Critical care provider statement:    Critical care time (minutes):  30   Critical care was necessary to treat or prevent imminent or life-threatening deterioration of the following conditions:  Renal failure and CNS failure or compromise   Critical care was time spent personally by me on the following activities:  Development of treatment plan with patient or surrogate, discussions with consultants, evaluation of patient's response to treatment, examination of patient, ordering and review of  laboratory studies, ordering and review of radiographic studies, ordering and performing treatments and interventions, pulse oximetry, re-evaluation of patient's condition and review of old charts    MEDICATIONS ORDERED IN ED: Medications  sodium chloride 0.9 % bolus 1,000 mL (has no administration in time range)  sodium zirconium cyclosilicate (LOKELMA) packet 10 g (has no administration in time range)  sodium bicarbonate injection 50 mEq (has no administration in time range)     IMPRESSION / MDM / ASSESSMENT AND PLAN / ED COURSE  I reviewed the triage vital signs and the nursing notes.   Patient's presentation is most consistent with acute presentation with potential threat to life or bodily function.   Patient comes in with altered mental status concern for some left-sided facial droop may be some speech impediment -stroke code was called given last known normal was this morning patient seen by neurology Dr Selina Cooley recommend MRI  not TNK candidate.  CMP showed elevated creatinine at 3.12 will add on CK get bladder scan.  Her potassium was 5.7 as well we will treat with some Lokelma and some bicarb.  INR is normal no white count elevation do not feel like she is having any infection.  I have reviewed her medication list that she is on a lot of sedating medications so this could also be contributing.  I will discuss with the hospital team for admission  Shunt series added and pending at time of dispo   The patient is on the cardiac monitor to evaluate for evidence of arrhythmia and/or significant heart rate changes.      FINAL CLINICAL IMPRESSION(S) / ED DIAGNOSES   Final diagnoses:  Hyperkalemia  Weakness  AKI (acute kidney injury) (HCC)     Rx / DC Orders   ED Discharge Orders     None        Note:  This document was prepared using Dragon voice recognition software and may include unintentional dictation errors.   Concha Se, MD 04/07/23 1133    Concha Se, MD 04/07/23 1154

## 2023-04-07 NOTE — ED Notes (Signed)
Informed Rn Victorino Dike via chat/ pt has bed assigned

## 2023-04-07 NOTE — Progress Notes (Signed)
PHARMACIST - PHYSICIAN COMMUNICATION  CONCERNING:  Enoxaparin (Lovenox) for DVT Prophylaxis   ASSESSMENT: Patient was prescribed enoxaparin 40 mg subcutaneously every 24 hours for VTE prophylaxis.   Body mass index is 31.62 kg/m.  Estimated Creatinine Clearance: 13.9 mL/min (A) (by C-G formula based on SCr of 3.12 mg/dL (H)).  Based on [facility] policy, patient qualifies for enoxaparin dosing of 30 mg every 24 hours because their creatinine clearance is <30 mL/min.  PLAN: Pharmacy has adjusted enoxaparin dose per [facility] policy.  Description: Patient is now receiving enoxaparin 30 mg subcutaneously every 24 hours.  Will M. Dareen Piano, PharmD Clinical Pharmacist 04/07/2023 1:25 PM

## 2023-04-07 NOTE — Progress Notes (Signed)
  Chaplain On-Call responded to Code Stroke notification at 0945 hours.  The patient was at the CT Scan area for tests. There is no family present.  Chaplain assured ED Staff of availability if requested.  Chaplain Evelena Peat M.Div., [facility]

## 2023-04-07 NOTE — Assessment & Plan Note (Signed)
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

## 2023-04-07 NOTE — ED Notes (Addendum)
ED TO INPATIENT HANDOFF REPORT  ED Nurse Name and Phone #: Horatio Pel [phone]  S Name/Age/Gender Kristen Campbell 77 y.o. female Room/Bed: ED37A/ED37A  Code Status   Code Status: Full Code  Home/SNF/Other Skilled nursing facility Patient oriented to: self and place Is this baseline? Yes Hx of dementia   Triage Complete: Triage complete  Chief Complaint AKI (acute kidney injury) (HCC) [N17.9]  Triage Note Pt. To ED from [facility] for weakness. Per medic, pt. LKW 0745, received AM meds around 0730, and was taken to breakfast around 0745 where she was unable to feed herself, speak, or hold objects. Medic reports pt hypotensive on scene, and very drowsy, has perked up en route. Pt. Currently drowsy, alert, oriented x2.   Allergies Allergies  Allergen Reactions   Carbamazepine Other (See Comments)    Hallucinations/ reaction to Tegretol    Dilantin [Phenytoin] Other (See Comments)    MD said never to take it - unknown reaction   Potassium Chloride Itching and Other (See Comments)    Reaction to KlorCon   Ibuprofen Itching    S/w daughter on 04/07/23 and confirmed that she does not have an allergy to ibuprofen, she states that her MD does not want her to take ibuprofen due to ulcers but okay to take tylenol    Codeine Nausea And Vomiting    Level of Care/Admitting Diagnosis ED Disposition     ED Disposition  Admit   Condition  --   Comment  Hospital Area: [facility] [[id number]]  Level of Care: Med-Surg [16]  Covid Evaluation: Confirmed COVID Negative  Diagnosis: AKI (acute kidney injury) [facility]) [[id number]]  Admitting Physician: Floydene Flock [[id number]]  Attending Physician: Floydene Flock [[id number]]          B Medical/Surgery History Past Medical History:  Diagnosis Date   Atherosclerotic heart disease    Brain aneurysm    Cerebrovascular disease    Chronic kidney disease    Depression    Heart failure (HCC)    Hypertension     Vascular dementia (HCC)    Past Surgical History:  Procedure Laterality Date   ABDOMINAL SURGERY     fluid tumor removal   ABDOMINAL SURGERY     bleeding ulcers   BALLOON DILATION N/A 03/12/2019   Procedure: BALLOON DILATION;  Surgeon: Kerin Salen, MD;  Location: [facility] ENDOSCOPY;  Service: Gastroenterology;  Laterality: N/A;   BIOPSY  03/12/2019   Procedure: BIOPSY;  Surgeon: Kerin Salen, MD;  Location: [facility] ENDOSCOPY;  Service: Gastroenterology;;   CEREBRAL ANEURYSM REPAIR     CSF SHUNT     ESOPHAGOGASTRODUODENOSCOPY (EGD) WITH PROPOFOL N/A 03/12/2019   Procedure: ESOPHAGOGASTRODUODENOSCOPY (EGD) WITH PROPOFOL;  Surgeon: Kerin Salen, MD;  Location: [facility] ENDOSCOPY;  Service: Gastroenterology;  Laterality: N/A;   ROTATOR CUFF REPAIR       A IV Location/Drains/Wounds Patient Lines/Drains/Airways Status     Active Line/Drains/Airways     Name Placement date Placement time Site Days   Peripheral IV 04/07/23 20 G Left Antecubital 04/07/23  --  Antecubital  less than 1            Intake/Output Last 24 hours  Intake/Output Summary (Last 24 hours) at 04/07/2023 1948 Last data filed at 04/07/2023 1541 Gross per 24 hour  Intake 1000 ml  Output --  Net 1000 ml    Labs/Imaging Results for orders placed or performed during the hospital encounter of 04/07/23 (from the past 48 hour(s))  Ethanol  Status: None   Collection Time: 04/07/23  9:45 AM  Result Value Ref Range   Alcohol, Ethyl (B) <10 <10 mg/dL    Comment: (NOTE) Lowest detectable limit for serum alcohol is 10 mg/dL.  For medical purposes only. Performed at [facility], [address] [place], [place] [address]   Protime-INR     Status: None   Collection Time: 04/07/23  9:45 AM  Result Value Ref Range   Prothrombin Time 14.3 11.4 - 15.2 seconds   INR 1.1 0.8 - 1.2    Comment: (NOTE) INR goal varies based on device and disease states. Performed at [facility], [address]  [place], [place] [address]   APTT     Status: None   Collection Time: 04/07/23  9:45 AM  Result Value Ref Range   aPTT 25 24 - 36 seconds    Comment: Performed at [facility], [address] [place], [place] [address]  CBC     Status: Abnormal   Collection Time: 04/07/23  9:45 AM  Result Value Ref Range   WBC 4.8 4.0 - 10.5 K/uL   RBC 3.45 (L) 3.87 - 5.11 MIL/uL   Hemoglobin 9.5 (L) 12.0 - 15.0 g/dL   HCT 86.5 (L) 78.4 - 69.6 %   MCV 89.3 80.0 - 100.0 fL   MCH 27.5 26.0 - 34.0 pg   MCHC 30.8 30.0 - 36.0 g/dL   RDW 29.5 28.4 - 13.2 %   Platelets 160 150 - 400 K/uL   nRBC 0.0 0.0 - 0.2 %    Comment: Performed at [facility], [address] [place], [place] [address]  Differential     Status: None   Collection Time: 04/07/23  9:45 AM  Result Value Ref Range   Neutrophils Relative % 68 %   Neutro Abs 3.3 1.7 - 7.7 K/uL   Lymphocytes Relative 18 %   Lymphs Abs 0.8 0.7 - 4.0 K/uL   Monocytes Relative 11 %   Monocytes Absolute 0.5 0.1 - 1.0 K/uL   Eosinophils Relative 2 %   Eosinophils Absolute 0.1 0.0 - 0.5 K/uL   Basophils Relative 0 %   Basophils Absolute 0.0 0.0 - 0.1 K/uL   Immature Granulocytes 1 %   Abs Immature Granulocytes 0.03 0.00 - 0.07 K/uL    Comment: Performed at [facility], [address] [place], [place] [address]  Comprehensive metabolic panel     Status: Abnormal   Collection Time: 04/07/23  9:45 AM  Result Value Ref Range   Sodium 138 135 - 145 mmol/L   Potassium 5.7 (H) 3.5 - 5.1 mmol/L   Chloride 108 98 - 111 mmol/L   CO2 23 22 - 32 mmol/L   Glucose, Bld 103 (H) 70 - 99 mg/dL    Comment: Glucose reference range applies only to samples taken after fasting for at least 8 hours.   BUN 94 (H) 8 - 23 mg/dL   Creatinine, Ser 6.64 (H) 0.44 - 1.00 mg/dL   Calcium 8.8 (L) 8.9 - 10.3 mg/dL   Total Protein 7.4 6.5 - 8.1 g/dL   Albumin 3.9 3.5 - 5.0 g/dL   AST 14 (L) 15 - 41 U/L   ALT 10 0 - 44 U/L   Alkaline Phosphatase 129 (H)  38 - 126 U/L   Total Bilirubin 0.6 0.3 - 1.2 mg/dL   GFR, Estimated 15 (L) >60 mL/min    Comment: (NOTE) Calculated using the CKD-EPI Creatinine Equation (  2021)    Anion gap 7 5 - 15    Comment: Performed at [facility], [address] [place], [place] [address]  Troponin I (High Sensitivity)     Status: None   Collection Time: 04/07/23  9:45 AM  Result Value Ref Range   Troponin I (High Sensitivity) 12 <18 ng/L    Comment: (NOTE) Elevated high sensitivity troponin I (hsTnI) values and significant  changes across serial measurements may suggest ACS but many other  chronic and acute conditions are known to elevate hsTnI results.  Refer to the "Links" section for chest pain algorithms and additional  guidance. Performed at [facility], [address] [place], [place] [address]   Urine Drug Screen, Qualitative     Status: Abnormal   Collection Time: 04/07/23 11:55 AM  Result Value Ref Range   Tricyclic, Ur Screen NONE DETECTED NONE DETECTED   Amphetamines, Ur Screen NONE DETECTED NONE DETECTED   MDMA (Ecstasy)Ur Screen NONE DETECTED NONE DETECTED   Cocaine Metabolite,Ur [place] NONE DETECTED NONE DETECTED   Opiate, Ur Screen POSITIVE (A) NONE DETECTED   Phencyclidine (PCP) Ur S NONE DETECTED NONE DETECTED   Cannabinoid 50 Ng, Ur [place] NONE DETECTED NONE DETECTED   Barbiturates, Ur Screen NONE DETECTED NONE DETECTED   Benzodiazepine, Ur Scrn NONE DETECTED NONE DETECTED   Methadone Scn, Ur NONE DETECTED NONE DETECTED    Comment: (NOTE) Tricyclics + metabolites, urine    Cutoff 1000 ng/mL Amphetamines + metabolites, urine  Cutoff 1000 ng/mL MDMA (Ecstasy), urine              Cutoff 500 ng/mL Cocaine Metabolite, urine          Cutoff 300 ng/mL Opiate + metabolites, urine        Cutoff 300 ng/mL Phencyclidine (PCP), urine         Cutoff 25 ng/mL Cannabinoid, urine                 Cutoff 50 ng/mL Barbiturates + metabolites, urine  Cutoff 200 ng/mL Benzodiazepine,  urine              Cutoff 200 ng/mL Methadone, urine                   Cutoff 300 ng/mL  The urine drug screen provides only a preliminary, unconfirmed analytical test result and should not be used for non-medical purposes. Clinical consideration and professional judgment should be applied to any positive drug screen result due to possible interfering substances. A more specific alternate chemical method must be used in order to obtain a confirmed analytical result. Gas chromatography / mass spectrometry (GC/MS) is the preferred confirm atory method. Performed at [facility], [address] [place], [place] [address]   Troponin I (High Sensitivity)     Status: None   Collection Time: 04/07/23 11:55 AM  Result Value Ref Range   Troponin I (High Sensitivity) 13 <18 ng/L    Comment: (NOTE) Elevated high sensitivity troponin I (hsTnI) values and significant  changes across serial measurements may suggest ACS but many other  chronic and acute conditions are known to elevate hsTnI results.  Refer to the "Links" section for chest pain algorithms and additional  guidance. Performed at [facility], [address] [place], [place] [address]   CK     Status: None   Collection Time: 04/07/23 11:55 AM  Result Value Ref Range   Total CK 69 38 - 234 U/L  Comment: Performed at [facility], [address] [place], [place] [address]  Sodium, urine, random     Status: None   Collection Time: 04/07/23 11:55 AM  Result Value Ref Range   Sodium, Ur 79 mmol/L    Comment: Performed at [facility], [address] [place], [place] [address]  Creatinine, urine, random     Status: None   Collection Time: 04/07/23 11:55 AM  Result Value Ref Range   Creatinine, Urine 32 mg/dL    Comment: Performed at [facility], [address] [place], [place] [address]  Urinalysis, Routine w reflex microscopic -     Status: Abnormal   Collection Time:  04/07/23 12:45 PM  Result Value Ref Range   Color, Urine COLORLESS (A) YELLOW   APPearance CLEAR (A) CLEAR   Specific Gravity, Urine 1.008 1.005 - 1.030   pH 7.0 5.0 - 8.0   Glucose, UA 50 (A) NEGATIVE mg/dL   Hgb urine dipstick NEGATIVE NEGATIVE   Bilirubin Urine NEGATIVE NEGATIVE   Ketones, ur NEGATIVE NEGATIVE mg/dL   Protein, ur NEGATIVE NEGATIVE mg/dL   Nitrite NEGATIVE NEGATIVE   Leukocytes,Ua NEGATIVE NEGATIVE    Comment: Performed at [facility], [address] [place], [place] [address]  Basic metabolic panel     Status: Abnormal   Collection Time: 04/07/23  2:55 PM  Result Value Ref Range   Sodium 141 135 - 145 mmol/L   Potassium 4.4 3.5 - 5.1 mmol/L   Chloride 108 98 - 111 mmol/L   CO2 24 22 - 32 mmol/L   Glucose, Bld 97 70 - 99 mg/dL    Comment: Glucose reference range applies only to samples taken after fasting for at least 8 hours.   BUN 84 (H) 8 - 23 mg/dL   Creatinine, Ser 2.95 (H) 0.44 - 1.00 mg/dL   Calcium 8.5 (L) 8.9 - 10.3 mg/dL   GFR, Estimated 18 (L) >60 mL/min    Comment: (NOTE) Calculated using the CKD-EPI Creatinine Equation (2021)    Anion gap 9 5 - 15    Comment: Performed at [facility], [address] [place], [place] [address]  CBG monitoring, ED     Status: Abnormal   Collection Time: 04/07/23  3:44 PM  Result Value Ref Range   Glucose-Capillary 116 (H) 70 - 99 mg/dL    Comment: Glucose reference range applies only to samples taken after fasting for at least 8 hours.   US RENAL  Result Date: 04/07/2023 CLINICAL DATA:  Acute on chronic renal failure. EXAM: RENAL / URINARY TRACT ULTRASOUND COMPLETE COMPARISON:  Renal ultrasound 01/18/2020 FINDINGS: Right Kidney: Renal measurements: 6.8 x 4.3 x 4.3 cm = volume: 65 mL. Mildly increased parenchymal echogenicity. No mass or hydronephrosis. Left Kidney: Renal measurements: 7.8 x 4.3 x 4.7 cm = volume: 81 mL. Mildly increased parenchymal echogenicity. Limited visualization of  the kidney without evidence of hydronephrosis or a gross Mass. Bladder: Appears normal for degree of bladder distention. Other: None. IMPRESSION: 1. No hydronephrosis. 2. Atrophic, mildly echogenic kidneys compatible with medical renal disease. Electronically Signed   By: Sebastian Ache M.D.   On: 04/07/2023 15:26   MR BRAIN WO CONTRAST  Result Date: 04/07/2023 CLINICAL DATA:  Neuro deficit, acute, stroke suspected. EXAM: MRI HEAD WITHOUT CONTRAST TECHNIQUE: Multiplanar, multiecho pulse sequences of the brain and surrounding structures were obtained without intravenous contrast. COMPARISON:  Head CT 04/07/2023 and MRI 12/19/2021 FINDINGS: Brain: There is no evidence of an  acute infarct, intracranial hemorrhage, mass, or midline shift. Confluent T2 hyperintensities in the cerebral white matter bilaterally have progressed from the prior MRI and are nonspecific but compatible with severe chronic small vessel ischemic disease. Moderate chronic small vessel ischemia is noted in the pons. There are chronic lacunar infarcts in the right basal ganglia and bilateral thalami. A right parieto-occipital approach ventriculostomy catheter again terminates in the frontal horn of the right lateral ventricle. The ventricles are mildly larger than on the 2023 MRI but are unchanged in size from a head CT from 01/24/2023, and the change from the prior MRI may be secondary to progressive cerebral atrophy. Mild diffuse dural thickening (or less likely tiny subdural collections) is less than on the prior MRI. Vascular: Major intracranial vascular flow voids are preserved. Skull and upper cervical spine: No suspicious marrow lesion. Sinuses/Orbits: Unremarkable orbits. No significant inflammatory changes in the paranasal sinuses. Small right and trace left mastoid effusions. Other: None. IMPRESSION: 1. No acute intracranial abnormality. 2. Severe chronic small vessel ischemic disease. Electronically Signed   By: Sebastian Ache M.D.   On:  04/07/2023 15:24   DG Abd 1 View  Result Date: 04/07/2023 CLINICAL DATA:  Altered mental status. Evaluate for shunt malfunction. EXAM: ABDOMEN - 1 VIEW COMPARISON:  Abdominal radiograph 03/12/2019 FINDINGS: VP shunt tubing is again seen coursing through the lateral aspect of the right hemithorax into the abdomen and currently terminates in the left upper quadrant (previously coiled in the pelvis and 2020). This tubing appears intact. Old, abandoned, calcified shunt tubing coursing from the midline of the chest into the right mid abdomen is unchanged. Surgical clips are noted in the epigastric region. No dilated loops of bowel are seen to suggest obstruction. No acute osseous abnormality is identified. IMPRESSION: VP shunt tubing appears intact in the lower chest and abdomen. Electronically Signed   By: Sebastian Ache M.D.   On: 04/07/2023 15:18   DG Cervical Spine 1 View  Result Date: 04/07/2023 CLINICAL DATA:  Altered mental status. EXAM: DG CERVICAL SPINE - 1 VIEW; SKULL - 1-3 VIEW COMPARISON:  CT head 04/07/2023. CT cervical spine 03/08/2023. Skull radiographs 03/29/2020. FINDINGS: A right parieto-occipital approach ventriculostomy is again seen terminating in the region of the frontal horn of the right lateral ventricle. Two sets of tubing are again seen coursing from the skull through the neck, one of which is calcified and disconnected. The connected, in-use tubing appears intact. Postoperative changes are noted from C5-C7 ACDF. Multilevel cervical facet arthropathy is noted. IMPRESSION: VP shunt tubing in the head and neck appears intact. Electronically Signed   By: Sebastian Ache M.D.   On: 04/07/2023 15:15   DG Skull 1-3 Views  Result Date: 04/07/2023 CLINICAL DATA:  Altered mental status. EXAM: DG CERVICAL SPINE - 1 VIEW; SKULL - 1-3 VIEW COMPARISON:  CT head 04/07/2023. CT cervical spine 03/08/2023. Skull radiographs 03/29/2020. FINDINGS: A right parieto-occipital approach ventriculostomy is  again seen terminating in the region of the frontal horn of the right lateral ventricle. Two sets of tubing are again seen coursing from the skull through the neck, one of which is calcified and disconnected. The connected, in-use tubing appears intact. Postoperative changes are noted from C5-C7 ACDF. Multilevel cervical facet arthropathy is noted. IMPRESSION: VP shunt tubing in the head and neck appears intact. Electronically Signed   By: Sebastian Ache M.D.   On: 04/07/2023 15:15   DG Chest 1 View  Result Date: 04/07/2023 CLINICAL DATA:  77 year old female with altered  mental status EXAM: CHEST  1 VIEW COMPARISON:  03/29/2020 FINDINGS: Cardiomediastinal silhouette unchanged in size and contour. No evidence of central vascular congestion. No interlobular septal thickening. No pneumothorax or pleural effusion. Coarsened interstitial markings, with no confluent airspace disease. No acute displaced fracture. Degenerative changes of the spine. There are 2 surgical tubing projecting over the right hemithorax, both of them terminating out of the field of view. The more medial projecting over the midline demonstrates circumferential calcifications. Unchanged appearance dating to the plain film 03/29/2020. Surgical changes of cervical region incompletely imaged. IMPRESSION: Negative for acute cardiopulmonary disease. There are 2 separate shunt tubing of the right hemithorax which are unchanged from the comparison plain film. Both of them terminate out of the field of view Electronically Signed   By: Gilmer Mor D.O.   On: 04/07/2023 15:08   CT HEAD CODE STROKE WO CONTRAST  Result Date: 04/07/2023 CLINICAL DATA:  Code stroke.  Neuro deficit, acute, stroke suspected EXAM: CT HEAD WITHOUT CONTRAST TECHNIQUE: Contiguous axial images were obtained from the base of the skull through the vertex without intravenous contrast. RADIATION DOSE REDUCTION: This exam was performed according to the departmental dose-optimization  program which includes automated exposure control, adjustment of the mA and/or kV according to patient size and/or use of iterative reconstruction technique. COMPARISON:  CT head 03/08/2023. FINDINGS: Brain: No evidence of acute infarction, hemorrhage, hydrocephalus, extra-axial collection or mass lesion/mass effect. Stable position of the right VP shunt with similar ventricular size. Patchy white matter hypodensities are nonspecific but compatible chronic microvascular disease. Remote right basal ganglia and left thalamic infarcts. Vascular: No hyperdense vessel. Skull: No acute fracture. Sinuses/Orbits: Clear sinuses.  No acute orbital findings. Other: No mastoid effusions. ASPECTS [facility] Stroke Program Early CT Score) total score (0-10 with 10 being normal): 10. IMPRESSION: 1. No evidence of acute large vascular territory infarct or acute hemorrhage. 2. Chronic microvascular disease. MRI could provide more sensitive evaluation for acute infarct if warranted. 3. Stable position of the right VP shunt with similar ventricular size. Electronically Signed   By: Feliberto Harts M.D.   On: 04/07/2023 10:09    Pending Labs Unresulted Labs (From admission, onward)     Start     Ordered   04/08/23 0500  CBC  Tomorrow morning,   R        04/07/23 1223   04/08/23 0500  Comprehensive metabolic panel  Tomorrow morning,   R        04/07/23 1223            Vitals/Pain Today's Vitals   04/07/23 1800 04/07/23 1804 04/07/23 1940 04/07/23 1948  BP: (!) 109/94 (!) 109/94 (!) 163/70   Pulse:  80    Resp: 12 12 14    Temp:  97.7 F (36.5 C)    TempSrc:  Oral    SpO2:  100%  94%  Weight:      PainSc:  4       Isolation Precautions No active isolations  Medications Medications  enoxaparin (LOVENOX) injection 30 mg (has no administration in time range)  0.9 %  sodium chloride infusion ( Intravenous New Bag/Given 04/07/23 1508)  HYDROmorphone (DILAUDID) injection 0.5 mg (0.5 mg Intravenous Given  04/07/23 1505)  sodium chloride 0.9 % bolus 1,000 mL (0 mLs Intravenous Stopped 04/07/23 1541)  sodium bicarbonate injection 50 mEq (50 mEq Intravenous Given 04/07/23 1334)  insulin aspart (novoLOG) injection 5 Units (5 Units Intravenous Given 04/07/23 1354)    And  dextrose  50 % solution 50 mL (50 mLs Intravenous Given 04/07/23 1336)    Mobility non-ambulatory   Have not seen patient walk - comes in with weakness and confusion from ALF.   Focused Assessments Neuro Assessment Handoff:  Swallow screen pass? Yes    NIH Stroke Scale  Dizziness Present: No Headache Present: Yes Interval: Shift assessment Level of Consciousness (1a.)   : Alert, keenly responsive LOC Questions (1b. )   : Answers both questions correctly LOC Commands (1c. )   : Performs both tasks correctly Best Gaze (2. )  : Normal Visual (3. )  : No visual loss Facial Palsy (4. )    : Minor paralysis (Not a new finding, Pt has had a past stroke) Motor Arm, Left (5a. )   : Drift (Not a new finding, Pt has had a past stroke) Motor Arm, Right (5b. ) : No drift Motor Leg, Left (6a. )  : Drift (Not a new finding, Pt has had a past stroke) Motor Leg, Right (6b. ) : No drift Limb Ataxia (7. ): Present in one limb Sensory (8. )  : Mild-to-moderate sensory loss, patient feels pinprick is less sharp or is dull on the affected side, or there is a loss of superficial pain with pinprick, but patient is aware of being touched (Not a new finding, Pt has had a past stroke) Best Language (9. )  : No aphasia Dysarthria (10. ): Normal Extinction/Inattention (11.)   : No Abnormality Complete NIHSS TOTAL: 5 Last date known well: 04/07/23 Last time known well: 0745 Neuro Assessment:   Neuro Checks:   Shift assessment (04/07/23 1144)  Has TPA been given? No If patient is a Neuro Trauma and patient is going to OR before floor call report to 4N Charge nurse: [phone] or [phone]   R Recommendations: See Admitting Provider  Note  Report given to:   Additional Notes: Pt incontinent on purewick

## 2023-04-07 NOTE — Progress Notes (Signed)
Telestroke RN Note:  [place] [id number]  Dr. Selina Cooley paged [phone] Pt to CT [phone] Dr. Selina Cooley in [place] [phone] Per Dr. Selina Cooley at [phone], no TNK - telestroke no longer needed at this time  Pt LNW 0730. mRS 2

## 2023-04-07 NOTE — Assessment & Plan Note (Signed)
Creatinine 3.1 today with baseline creatinine around 1.6 Clinically dry on exam IV fluid hydration Hold nephrotoxic agents Check FENa Renal imaging as clinically indicated Monitor

## 2023-04-07 NOTE — Assessment & Plan Note (Signed)
Remote hx/o CVA  + dysarthia on presentation- unclear of baseline  MRI brain pending to better assess  Follow up neuro recommendations

## 2023-04-07 NOTE — Consult Note (Signed)
CODE STROKE- PHARMACY COMMUNICATION   Time CODE STROKE called/page received:0945  Time response to CODE STROKE was made (in person or via phone):   Time Stroke Kit retrieved from [employer] (only if needed):0950  Name of Provider/Nurse contacted:Stack  No TNK per MD assessment.   Past Medical History:  Diagnosis Date   Atherosclerotic heart disease    Brain aneurysm    Cerebrovascular disease    Chronic kidney disease    Depression    Heart failure (HCC)    Hypertension    Vascular dementia (HCC)    Prior to Admission medications   Medication Sig Start Date End Date Taking? Authorizing Provider  Acetaminophen 500 MG capsule Take 500-1,000 mg by mouth every 6 (six) hours as needed (for headaches or pain).     [provider]  busPIRone (BUSPAR) 5 MG tablet Take 1 tablet (5 mg total) by mouth 2 (two) times daily. Patient taking differently: Take 5 mg by mouth 2 (two) times daily as needed (for anxiety). 03/18/20   Emi Belfast, FNP  cyclobenzaprine (FLEXERIL) 10 MG tablet Take 1 tablet (10 mg total) by mouth in the morning and at bedtime. 04/15/20   Emi Belfast, FNP  diazepam (VALIUM) 5 MG tablet Take 1 tab 30 min before procedure may add a second tab if needed 12/17/21   Marcos Eke, PA-C  DULoxetine (CYMBALTA) 30 MG capsule Take 1 capsule (30 mg total) by mouth every morning. 03/18/20   Emi Belfast, FNP  hydrochlorothiazide (MICROZIDE) 12.5 MG capsule TAKE 1 CAPSULE BY MOUTH ONCE DAILY 04/19/20   Emi Belfast, FNP  memantine (NAMENDA) 10 MG tablet Take 1 tablet (10 mg at night) for 2 weeks, then increase to 1 tablet (10 mg) twice a day 12/29/21   Marcos Eke, PA-C  nebivolol (BYSTOLIC) 5 MG tablet Take 2 tablets (10 mg total) by mouth every morning. 03/18/20   Emi Belfast, FNP  pantoprazole (PROTONIX) 40 MG tablet TAKE 1 TABLET BY MOUTH 2 TIMES DAILY BEFORE A MEAL Patient taking differently: Take 40 mg by mouth 2 (two) times daily before a  meal. 05/29/19   Levora Dredge, MD    Ronnald Ramp ,PharmD Clinical Pharmacist  04/07/2023  9:58 AM

## 2023-04-07 NOTE — Assessment & Plan Note (Signed)
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 Lorenso Courier.  VP shunt series pending

## 2023-04-07 NOTE — ED Triage Notes (Signed)
Pt. To ED from [facility] for weakness. Per medic, pt. LKW 0745, received AM meds around 0730, and was taken to breakfast around 0745 where she was unable to feed herself, speak, or hold objects. Medic reports pt hypotensive on scene, and very drowsy, has perked up en route. Pt. Currently drowsy, alert, oriented x2.

## 2023-04-07 NOTE — Consult Note (Signed)
NEUROLOGY CONSULTATION NOTE   Date of service: April 07, 2023 Patient Name: Kristen Campbell MRN:  [id number] DOB:  Apr 07, 1946 Reason for consult: stroke code for L facial droop Requesting physician: Dr. 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  Last known well was 7:45 AM when she was eating breakfast at her facility when she became lethargic, unable to feed herself, unable to hold objects in her hands.  Blood pressure on scene per EMS 70s/50s. On my exam after she received IVF (SBP 100) she had only mild dysarthria and L NLF flattening with no other deficits NIHSS = 2. Head CT personal review showed no acute process, stable R VP shunt placement. TNK was not administered bc sx were felt to be 2/2 global hypoperfusion in the setting of severe hypotension. CTA was not performed bc exam was not c/w LVO.  MRI brain performed after the stroke code showed no e/o acute infarct   ROS   Per HPI: all other systems reviewed and are negative  Past History   I have reviewed the following:  Past Medical History:  Diagnosis Date   Atherosclerotic heart disease    Brain aneurysm    Cerebrovascular disease    Chronic kidney disease    Depression    Heart failure (HCC)    Hypertension    Vascular dementia (HCC)    Past Surgical History:  Procedure Laterality Date   ABDOMINAL SURGERY     fluid tumor removal   ABDOMINAL SURGERY     bleeding ulcers   BALLOON DILATION N/A 03/12/2019   Procedure: BALLOON DILATION;  Surgeon: Kerin Salen, MD;  Location: [facility] ENDOSCOPY;  Service: Gastroenterology;  Laterality: N/A;   BIOPSY  03/12/2019   Procedure: BIOPSY;  Surgeon: Kerin Salen, MD;  Location: [facility] ENDOSCOPY;  Service: Gastroenterology;;   CEREBRAL ANEURYSM REPAIR     CSF SHUNT      ESOPHAGOGASTRODUODENOSCOPY (EGD) WITH PROPOFOL N/A 03/12/2019   Procedure: ESOPHAGOGASTRODUODENOSCOPY (EGD) WITH PROPOFOL;  Surgeon: Kerin Salen, MD;  Location: [facility] ENDOSCOPY;  Service: Gastroenterology;  Laterality: N/A;   ROTATOR CUFF REPAIR     No family history on file. Social History   Socioeconomic History   Marital status: Married    Spouse name: Not on file   Number of children: Not on file   Years of education: Not on file   Highest education level: Not on file  Occupational History   Not on file  Tobacco Use   Smoking status: Never   Smokeless tobacco: Never  Substance and Sexual Activity   Alcohol use: No   Drug use: No   Sexual activity: Not Currently    Birth control/protection: None  Other Topics Concern   Not on file  Social History Narrative   Right handed   No coffee intake   One story home   Social Determinants of Health   Financial Resource Strain: Not on file  Food Insecurity: Not on file  Transportation Needs: Not on file  Physical Activity: Not on file  Stress: Not on file  Social Connections: Not on file   Allergies  Allergen Reactions   Carbamazepine Other (See Comments)    Hallucinations/ reaction to  Tegretol    Dilantin [Phenytoin] Other (See Comments)    MD said never to take it - unknown reaction   Potassium Chloride Itching and Other (See Comments)    Reaction to KlorCon   Ibuprofen Itching    S/w daughter on 04/07/23 and confirmed that she does not have an allergy to ibuprofen, she states that her MD does not want her to take ibuprofen due to ulcers but okay to take tylenol    Codeine Nausea And Vomiting    Medications   (Not in a hospital admission)     Current Facility-Administered Medications:    0.9 %  sodium chloride infusion, , Intravenous, Continuous, Floydene Flock, MD, Last Rate: 100 mL/hr at 04/07/23 1508, New Bag at 04/07/23 1508   enoxaparin (LOVENOX) injection 30 mg, 30 mg, Subcutaneous, Q24H, Floydene Flock,  MD   HYDROmorphone (DILAUDID) injection 0.5 mg, 0.5 mg, Intravenous, Q4H PRN, Floydene Flock, MD, 0.5 mg at 04/07/23 1505  Current Outpatient Medications:    DULoxetine (CYMBALTA) 60 MG capsule, Take 60 mg by mouth daily., Disp: , Rfl:    furosemide (LASIX) 20 MG tablet, Take 20 mg by mouth 2 (two) times daily., Disp: , Rfl:    HYDROmorphone (DILAUDID) 2 MG tablet, Take 2 mg by mouth daily as needed for moderate pain (pain score 4-6) or severe pain (pain score 7-10)., Disp: , Rfl:    loratadine (CLARITIN) 10 MG tablet, Take 10 mg by mouth daily as needed for allergies., Disp: , Rfl:    LORazepam (ATIVAN) 0.5 MG tablet, Take 0.5 mg by mouth every 8 (eight) hours as needed for anxiety., Disp: , Rfl:    meloxicam (MOBIC) 15 MG tablet, Take 15 mg by mouth daily., Disp: , Rfl:    omeprazole (PRILOSEC) 20 MG capsule, Take 20 mg by mouth daily., Disp: , Rfl:    potassium chloride 20 MEQ/15ML (10%) SOLN, Take 15 mLs by mouth 2 (two) times daily., Disp: , Rfl:    pregabalin (LYRICA) 50 MG capsule, Take 50 mg by mouth 3 (three) times daily., Disp: , Rfl:    QUEtiapine (SEROQUEL) 25 MG tablet, Take 12.5 mg by mouth 2 (two) times daily., Disp: , Rfl:    traMADol (ULTRAM) 50 MG tablet, Take 50 mg by mouth every 6 (six) hours as needed for moderate pain (pain score 4-6) or severe pain (pain score 7-10). Use first before trying dilaudid (hydromorphone), Disp: , Rfl:    DULoxetine (CYMBALTA) 30 MG capsule, Take 1 capsule (30 mg total) by mouth every morning. (Patient not taking: Reported on 04/07/2023), Disp: 30 capsule, Rfl: 1  Vitals   Vitals:   04/07/23 1800 04/07/23 1804 04/07/23 1940 04/07/23 1948  BP: (!) 109/94 (!) 109/94 (!) 163/70   Pulse:  80    Resp: 12 12 14    Temp:  97.7 F (36.5 C)    TempSrc:  Oral    SpO2:  100%  94%  Weight:         Body mass index is 31.62 kg/m.  Physical Exam   Physical Exam Gen: A&O x4, NAD HEENT: Atraumatic, normocephalic;mucous membranes moist;  oropharynx clear, tongue without atrophy or fasciculations. Neck: Supple, trachea midline. Resp: CTAB, no w/r/r CV: RRR, no m/g/r; nml S1 and S2. 2+ symmetric peripheral pulses. Abd: soft/NT/ND; nabs x 4 quad Extrem: Nml bulk; no cyanosis, clubbing, or edema.  Neuro: *MS: A&O x4. Follows multi-step commands.  *Speech: fluid, mild dysarthria, able to name and repeat *CN:  I: Deferred   II,III: PERRLA, VFF by confrontation, optic discs unable to be visualized 2/2 pupillary constriction   III,IV,VI: EOMI w/o nystagmus, no ptosis   V: Sensation intact from V1 to V3 to LT   VII: Eyelid closure was full.  L NLF flattening that corrects with smile   VIII: Hearing intact to voice   IX,X: Voice normal, palate elevates symmetrically    XI: SCM/trap 5/5 bilat   XII: Tongue protrudes midline, no atrophy or fasciculations   *Motor:   Normal bulk.  No tremor, rigidity or bradykinesia. No pronator drift.    Strength: Dlt Bic Tri WrE WrF FgS Gr HF KnF KnE PlF DoF    Left 5 5 5 5 5 5 5 5 5 5 5 5     Right 5 5 5 5 5 5 5 5 5 5 5 5     *Sensory: Intact to light touch, pinprick, temperature vibration throughout. Symmetric. Propioception intact bilat.  No double-simultaneous extinction.  *Coordination:  Finger-to-nose, heel-to-shin, rapid alternating motions were intact. *Reflexes:  2+ and symmetric throughout without clonus; toes down-going bilat *Gait: deferred  NIHSS = 2 for dysarthria and L NLF flattening  Premorbid mRS = 2   Labs   CBC:  Recent Labs  Lab 04/07/23 0945  WBC 4.8  NEUTROABS 3.3  HGB 9.5*  HCT 30.8*  MCV 89.3  PLT 160    Basic Metabolic Panel:  Lab Results  Component Value Date   NA 141 04/07/2023   K 4.4 04/07/2023   CO2 24 04/07/2023   GLUCOSE 97 04/07/2023   BUN 84 (H) 04/07/2023   CREATININE 2.65 (H) 04/07/2023   CALCIUM 8.5 (L) 04/07/2023   GFRNONAA 18 (L) 04/07/2023   GFRAA 37 (L) 01/19/2020   Lipid Panel: No results found for: "LDLCALC" HgbA1c: No  results found for: "HGBA1C" Urine Drug Screen:     Component Value Date/Time   LABOPIA POSITIVE (A) 04/07/2023 1155   COCAINSCRNUR NONE DETECTED 04/07/2023 1155   LABBENZ NONE DETECTED 04/07/2023 1155   AMPHETMU NONE DETECTED 04/07/2023 1155   THCU NONE DETECTED 04/07/2023 1155   LABBARB NONE DETECTED 04/07/2023 1155    Alcohol Level     Component Value Date/Time   ETH <10 04/07/2023 0945    CT Head without contrast: No acute findings, stable R VP shunt  MRI Brain No acute infarct  CNS imaging personally reviewed  Impression   This is a 77 yo woman with 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 TNK was not administered bc sx were felt to be 2/2 global hypoperfusion in the setting of severe hypotension. MRI brain performed after the stroke code showed no e/o acute infarct  Recommendations   - MRI brain negative for acute infarct, no further neurologic workup indicated at this time ______________________________________________________________________   Thank you for the opportunity to take part in the care of this patient. If you have any further questions, please contact the neurology consultation attending.  Signed,  Bing Neighbors, MD [facility] [phone]  If 7pm- 7am, please page neurology on call as listed in AMION.  **Any copied and pasted documentation in this note was written by me in another application not billed for and pasted by me into this document.

## 2023-04-07 NOTE — H&P (Addendum)
History and Physical    Patient: Kristen Campbell WUJ:[id number] DOB: [birth year] DOA: 04/07/2023 DOS: the patient was seen and examined on 04/07/2023 PCP: Emi Belfast, FNP  Patient coming from: SNF  Chief Complaint:  Chief Complaint  Patient presents with   Weakness   HPI: Kristen Campbell is a 77 y.o. female with medical history significant of HTN, CVA, CKD, dementia presenting w/ encephalopathy, AoCKI.  Limited history in setting of encephalopathy.  Per report, patient had her breakfast as well as medications earlier today when patient became lethargic at facility.  Last well-known around 7:45 AM.  Patient was unable to feed herself speak or hold objects.  No reported fevers or chills.  No reported nausea or vomiting.  No reported chest pain shortness of breath.  No abdominal pain.  Remote history of CVA in the past. Presented to the ER afebrile, hemodynamically stable.  Code stroke initially called.  White count 4.8, hemoglobin 9.5, platelets 160, troponin and CK within normal limits.  Creatinine 3.12.  CTA code stroke grossly stable.  Positive chronic microvascular disease.  Stable position of VP shunt. Review of Systems: As mentioned in the history of present illness. All other systems reviewed and are negative. Past Medical History:  Diagnosis Date   Atherosclerotic heart disease    Brain aneurysm    Cerebrovascular disease    Chronic kidney disease    Depression    Heart failure (HCC)    Hypertension    Vascular dementia (HCC)    Past Surgical History:  Procedure Laterality Date   ABDOMINAL SURGERY     fluid tumor removal   ABDOMINAL SURGERY     bleeding ulcers   BALLOON DILATION N/A 03/12/2019   Procedure: BALLOON DILATION;  Surgeon: Kerin Salen, MD;  Location: [facility] ENDOSCOPY;  Service: Gastroenterology;  Laterality: N/A;   BIOPSY  03/12/2019   Procedure: BIOPSY;  Surgeon: Kerin Salen, MD;  Location: [facility] ENDOSCOPY;  Service: Gastroenterology;;   CEREBRAL ANEURYSM REPAIR      CSF SHUNT     ESOPHAGOGASTRODUODENOSCOPY (EGD) WITH PROPOFOL N/A 03/12/2019   Procedure: ESOPHAGOGASTRODUODENOSCOPY (EGD) WITH PROPOFOL;  Surgeon: Kerin Salen, MD;  Location: [facility] ENDOSCOPY;  Service: Gastroenterology;  Laterality: N/A;   ROTATOR CUFF REPAIR     Social History:  reports that she has never smoked. She has never used smokeless tobacco. She reports that she does not drink alcohol and does not use drugs.  Allergies  Allergen Reactions   Carbamazepine Other (See Comments)    Hallucinations/ reaction to Tegretol    Dilantin [Phenytoin] Other (See Comments)    MD said never to take it - unknown reaction   Potassium Chloride Itching and Other (See Comments)    Reaction to KlorCon   Ibuprofen Itching    S/w daughter on 04/07/23 and confirmed that she does not have an allergy to ibuprofen, she states that her MD does not want her to take ibuprofen due to ulcers but okay to take tylenol    Codeine Nausea And Vomiting    No family history on file.  Prior to Admission medications   Medication Sig Start Date End Date Taking? Authorizing Provider  furosemide (LASIX) 20 MG tablet Take 20 mg by mouth 2 (two) times daily. 03/24/23  Yes [provider]  HYDROmorphone (DILAUDID) 2 MG tablet Take 2 mg by mouth daily as needed for moderate pain (pain score 4-6) or severe pain (pain score 7-10). 02/06/23  Yes [provider]  LORazepam (ATIVAN) 0.5 MG tablet Take  0.5 mg by mouth every 8 (eight) hours as needed for anxiety. 04/06/23  Yes [provider]  meloxicam (MOBIC) 15 MG tablet Take 15 mg by mouth daily. 03/24/23  Yes [provider]  omeprazole (PRILOSEC) 20 MG capsule Take 20 mg by mouth daily. 03/24/23  Yes [provider]  potassium chloride 20 MEQ/15ML (10%) SOLN Take 15 mLs by mouth 2 (two) times daily. 04/01/23  Yes [provider]  pregabalin (LYRICA) 50 MG capsule Take 50 mg by mouth 3 (three) times daily. 03/22/23  Yes  [provider]  QUEtiapine (SEROQUEL) 25 MG tablet Take 25 mg by mouth at bedtime. 03/24/23  Yes [provider]  traMADol (ULTRAM) 50 MG tablet Take 50 mg by mouth 4 (four) times daily as needed. 03/22/23  Yes [provider]  Acetaminophen 500 MG capsule Take 500-1,000 mg by mouth every 6 (six) hours as needed (for headaches or pain).     [provider]  busPIRone (BUSPAR) 5 MG tablet Take 1 tablet (5 mg total) by mouth 2 (two) times daily. Patient taking differently: Take 5 mg by mouth 2 (two) times daily as needed (for anxiety). 03/18/20   Emi Belfast, FNP  cyclobenzaprine (FLEXERIL) 10 MG tablet Take 1 tablet (10 mg total) by mouth in the morning and at bedtime. 04/15/20   Emi Belfast, FNP  diazepam (VALIUM) 5 MG tablet Take 1 tab 30 min before procedure may add a second tab if needed 12/17/21   Marcos Eke, PA-C  DULoxetine (CYMBALTA) 30 MG capsule Take 1 capsule (30 mg total) by mouth every morning. 03/18/20   Emi Belfast, FNP  DULoxetine (CYMBALTA) 60 MG capsule Take 60 mg by mouth daily.    [provider]  hydrochlorothiazide (MICROZIDE) 12.5 MG capsule TAKE 1 CAPSULE BY MOUTH ONCE DAILY 04/19/20   Emi Belfast, FNP  memantine (NAMENDA) 10 MG tablet Take 1 tablet (10 mg at night) for 2 weeks, then increase to 1 tablet (10 mg) twice a day 12/29/21   Marcos Eke, PA-C  nebivolol (BYSTOLIC) 5 MG tablet Take 2 tablets (10 mg total) by mouth every morning. 03/18/20   Emi Belfast, FNP  pantoprazole (PROTONIX) 40 MG tablet TAKE 1 TABLET BY MOUTH 2 TIMES DAILY BEFORE A MEAL Patient taking differently: Take 40 mg by mouth 2 (two) times daily before a meal. 05/29/19   Levora Dredge, MD    Physical Exam: Vitals:   04/07/23 0944 04/07/23 1000  BP:  120/62  Pulse:  68  Resp:  14  SpO2:  95%  Weight: 73.4 kg    Physical Exam Constitutional:      Appearance: She is normal weight.  HENT:     Head: Normocephalic  and atraumatic.     Nose: Nose normal.     Mouth/Throat:     Mouth: Mucous membranes are moist.  Eyes:     Pupils: Pupils are equal, round, and reactive to light.  Cardiovascular:     Rate and Rhythm: Normal rate and regular rhythm.  Pulmonary:     Effort: Pulmonary effort is normal.  Abdominal:     General: Bowel sounds are normal.  Musculoskeletal:     Comments: + generalized weakness    Skin:    General: Skin is warm and dry.  Neurological:     Comments: + dysarthria and mild generalized confusion    Psychiatric:        Mood and Affect: Mood normal.  Data Reviewed:  There are no new results to review at this time.  CT HEAD CODE STROKE WO CONTRAST CLINICAL DATA:  Code stroke.  Neuro deficit, acute, stroke suspected  EXAM: CT HEAD WITHOUT CONTRAST  TECHNIQUE: Contiguous axial images were obtained from the base of the skull through the vertex without intravenous contrast.  RADIATION DOSE REDUCTION: This exam was performed according to the departmental dose-optimization program which includes automated exposure control, adjustment of the mA and/or kV according to patient size and/or use of iterative reconstruction technique.  COMPARISON:  CT head 03/08/2023.  FINDINGS: Brain: No evidence of acute infarction, hemorrhage, hydrocephalus, extra-axial collection or mass lesion/mass effect. Stable position of the right VP shunt with similar ventricular size. Patchy white matter hypodensities are nonspecific but compatible chronic microvascular disease. Remote right basal ganglia and left thalamic infarcts.  Vascular: No hyperdense vessel.  Skull: No acute fracture.  Sinuses/Orbits: Clear sinuses.  No acute orbital findings.  Other: No mastoid effusions.  ASPECTS [facility] Stroke Program Early CT Score) total score (0-10 with 10 being normal): 10.  IMPRESSION: 1. No evidence of acute large vascular territory infarct or acute hemorrhage. 2. Chronic  microvascular disease. MRI could provide more sensitive evaluation for acute infarct if warranted. 3. Stable position of the right VP shunt with similar ventricular size.  Electronically Signed   By: Feliberto Harts M.D.   On: 04/07/2023 10:09  Lab Results  Component Value Date   WBC 4.8 04/07/2023   HGB 9.5 (L) 04/07/2023   HCT 30.8 (L) 04/07/2023   MCV 89.3 04/07/2023   PLT 160 04/07/2023   Last metabolic panel Lab Results  Component Value Date   GLUCOSE 103 (H) 04/07/2023   NA 138 04/07/2023   K 5.7 (H) 04/07/2023   CL 108 04/07/2023   CO2 23 04/07/2023   BUN 94 (H) 04/07/2023   CREATININE 3.12 (H) 04/07/2023   GFRNONAA 15 (L) 04/07/2023   CALCIUM 8.8 (L) 04/07/2023   PROT 7.4 04/07/2023   ALBUMIN 3.9 04/07/2023   LABGLOB 2.8 04/06/2019   AGRATIO 1.4 04/06/2019   BILITOT 0.6 04/07/2023   ALKPHOS 129 (H) 04/07/2023   AST 14 (L) 04/07/2023   ALT 10 04/07/2023   ANIONGAP 7 04/07/2023    Assessment and Plan: * Acute on chronic kidney failure (HCC) Creatinine 3.1 today with baseline creatinine around 1.6 Clinically dry on exam IV fluid hydration Hold nephrotoxic agents Check FENa Renal imaging as clinically indicated Monitor  Encephalopathy Acute generalized confusion lethargy on presentation  Code stroke initially called in the setting of mild dysarthria CT of the head within normal limits MRI of the brain pending Noted baseline VP shunt in place-VP series pending Fairly dry on exam with acute on chronic kidney injury Suspect multifactorial component with contribution of dehydration-unclear of initial baseline No overt infection noted at present IV fluid hydration Otherwise monitor    S/P VP shunt Remote history of aneurysm with secondary hydrocephalus status post VP shunt placement roughly 35 years ago S/p Right VP Shunt Revision on 05/15/2015 performed by Dr. Lorenso Courier.  VP shunt series pending    History of CVA (cerebrovascular accident) Remote  hx/o CVA  + dysarthia on presentation- unclear of baseline  MRI brain pending to better assess  Follow up neuro recommendations    Essential hypertension BP stable  Titrate home regimen    Greater than 50% was spent in counseling and coordination of care with patient Total encounter time 80 minutes or more  Advance Care Planning:   Code Status: Full Code   Consults: Neurology   Family Communication: No family at the bedside   Severity of Illness: The appropriate patient status for this patient is OBSERVATION. Observation status is judged to be reasonable and necessary in order to provide the required intensity of service to ensure the patient's safety. The patient's presenting symptoms, physical exam findings, and initial radiographic and laboratory data in the context of their medical condition is felt to place them at decreased risk for further clinical deterioration. Furthermore, it is anticipated that the patient will be medically stable for discharge from the hospital within 2 midnights of admission.   Author: Floydene Flock, MD 04/07/2023 12:36 PM  For on call review [email or web address].

## 2023-04-07 NOTE — ED Notes (Signed)
CODE STROKE CALLED TO CARELINK AT 9:43 am

## 2023-04-08 DIAGNOSIS — I10 Essential (primary) hypertension: Secondary | ICD-10-CM | POA: Diagnosis not present

## 2023-04-08 DIAGNOSIS — G9340 Encephalopathy, unspecified: Secondary | ICD-10-CM | POA: Diagnosis not present

## 2023-04-08 DIAGNOSIS — N184 Chronic kidney disease, stage 4 (severe): Secondary | ICD-10-CM

## 2023-04-08 DIAGNOSIS — Z982 Presence of cerebrospinal fluid drainage device: Secondary | ICD-10-CM | POA: Diagnosis not present

## 2023-04-08 DIAGNOSIS — N179 Acute kidney failure, unspecified: Secondary | ICD-10-CM | POA: Diagnosis not present

## 2023-04-08 LAB — CBC
HCT: 29.6 % — ABNORMAL LOW (ref 36.0–46.0)
Hemoglobin: 9.3 g/dL — ABNORMAL LOW (ref 12.0–15.0)
MCH: 28.2 pg (ref 26.0–34.0)
MCHC: 31.4 g/dL (ref 30.0–36.0)
MCV: 89.7 fL (ref 80.0–100.0)
Platelets: 144 10*3/uL — ABNORMAL LOW (ref 150–400)
RBC: 3.3 MIL/uL — ABNORMAL LOW (ref 3.87–5.11)
RDW: 14.4 % (ref 11.5–15.5)
WBC: 3 10*3/uL — ABNORMAL LOW (ref 4.0–10.5)
nRBC: 0 % (ref 0.0–0.2)

## 2023-04-08 LAB — COMPREHENSIVE METABOLIC PANEL
ALT: 11 U/L (ref 0–44)
AST: 14 U/L — ABNORMAL LOW (ref 15–41)
Albumin: 3.5 g/dL (ref 3.5–5.0)
Alkaline Phosphatase: 120 U/L (ref 38–126)
Anion gap: 10 (ref 5–15)
BUN: 76 mg/dL — ABNORMAL HIGH (ref 8–23)
CO2: 22 mmol/L (ref 22–32)
Calcium: 8.6 mg/dL — ABNORMAL LOW (ref 8.9–10.3)
Chloride: 108 mmol/L (ref 98–111)
Creatinine, Ser: 2.29 mg/dL — ABNORMAL HIGH (ref 0.44–1.00)
GFR, Estimated: 22 mL/min — ABNORMAL LOW (ref >60)
Glucose, Bld: 113 mg/dL — ABNORMAL HIGH (ref 70–99)
Potassium: 4.6 mmol/L (ref 3.5–5.1)
Sodium: 140 mmol/L (ref 135–145)
Total Bilirubin: 0.4 mg/dL (ref 0.3–1.2)
Total Protein: 6.4 g/dL — ABNORMAL LOW (ref 6.5–8.1)

## 2023-04-08 MED ORDER — QUETIAPINE FUMARATE 25 MG PO TABS
12.5000 mg | ORAL_TABLET | Freq: Two times a day (BID) | ORAL | Status: DC
  Administered 2023-04-08 – 2023-04-12 (×9): 12.5 mg via ORAL
  Filled 2023-04-08 (×10): qty 1

## 2023-04-08 MED ORDER — TRAMADOL HCL 50 MG PO TABS
50.0000 mg | ORAL_TABLET | Freq: Four times a day (QID) | ORAL | Status: DC | PRN
  Administered 2023-04-09 – 2023-04-11 (×5): 50 mg via ORAL
  Filled 2023-04-08 (×5): qty 1

## 2023-04-08 MED ORDER — OXYCODONE-ACETAMINOPHEN 5-325 MG PO TABS
2.0000 | ORAL_TABLET | Freq: Once | ORAL | Status: AC
  Administered 2023-04-08: 2 via ORAL
  Filled 2023-04-08: qty 2

## 2023-04-08 MED ORDER — AMLODIPINE BESYLATE 5 MG PO TABS
5.0000 mg | ORAL_TABLET | Freq: Every day | ORAL | Status: DC
  Administered 2023-04-08 – 2023-04-12 (×5): 5 mg via ORAL
  Filled 2023-04-08 (×5): qty 1

## 2023-04-08 MED ORDER — HYDRALAZINE HCL 20 MG/ML IJ SOLN: 10.0000 mg | Freq: Four times a day (QID) | INTRAMUSCULAR | Status: DC | PRN

## 2023-04-08 MED ORDER — MELOXICAM 7.5 MG PO TABS
15.0000 mg | ORAL_TABLET | Freq: Every day | ORAL | Status: DC
  Administered 2023-04-08: 15 mg via ORAL
  Filled 2023-04-08 (×2): qty 2

## 2023-04-08 MED ORDER — PREGABALIN 50 MG PO CAPS
50.0000 mg | ORAL_CAPSULE | Freq: Three times a day (TID) | ORAL | Status: DC
  Administered 2023-04-08 – 2023-04-12 (×13): 50 mg via ORAL
  Filled 2023-04-08 (×13): qty 1

## 2023-04-08 MED ORDER — PANTOPRAZOLE SODIUM 40 MG PO TBEC
40.0000 mg | DELAYED_RELEASE_TABLET | Freq: Every day | ORAL | Status: DC
  Administered 2023-04-08 – 2023-04-12 (×5): 40 mg via ORAL
  Filled 2023-04-08 (×5): qty 1

## 2023-04-08 MED ORDER — DULOXETINE HCL 30 MG PO CPEP
60.0000 mg | ORAL_CAPSULE | Freq: Every day | ORAL | Status: DC
  Administered 2023-04-08 – 2023-04-12 (×5): 60 mg via ORAL
  Filled 2023-04-08 (×5): qty 2

## 2023-04-08 NOTE — Evaluation (Signed)
Occupational Therapy Evaluation Patient Details Name: Kristen Campbell MRN: [id number] DOB: 03/04/1946 Today's Date: 04/08/2023   History of Present Illness Pt is a 77 yo female presenting with AMS, increased lethargy, hypotension, and slight facial droop. Stroke code called but no significant imaging findings. 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 Prior to hospital admission, pt reports she was living in ALF and ambulating with a RW and someone following behind at all times. She reports intermittent assistance needed with ADL performance depending on her pain levels, etc. She always had SUP/SBA for shower transfers.   Pt presents to acute OT demonstrating impaired ADL performance and functional mobility 2/2 pain, weakness, balance deficits and limited activity tolerance (See OT problem list for additional functional deficits). Pt currently requires SBA for UB ADLs seated at EOB, Max A for LB ADLs. She required Min A for STS from recliner and EOB and Mod A for SPT from chair back to bed d/t BLE weakness/pain. Pt attempting to sit on EOB prior to fully reachin it. Able to scoot back onto bed with CGA. Performed sit to supine with CGA/SBA and scooted up to [facility] with SUP and verb cues to use bedrails. Pt noted to fatigue easily with all activity requiring intermittent rest breaks. Pt would benefit from skilled OT services to address noted impairments and functional limitations (see below for any additional details) in order to maximize safety and independence while minimizing falls risk and caregiver burden. Recommend SNF upon acute hospital DC d/t need for increased assistance with ADLs and transfers at this time.       If plan is discharge home, recommend the following: A lot of help with  bathing/dressing/bathroom;A lot of help with walking and/or transfers;Assistance with cooking/housework;Help with stairs or ramp for entrance;Direct supervision/assist for medications management;Direct supervision/assist for financial management    Functional Status Assessment  Patient has had a recent decline in their functional status and demonstrates the ability to make significant improvements in function in a reasonable and predictable amount of time.  Equipment Recommendations  Other (comment) (defer)    Recommendations for Other Services       Precautions / Restrictions Precautions Precautions: Fall Restrictions Weight Bearing Restrictions: No      Mobility Bed Mobility Overal bed mobility: Needs Assistance Bed Mobility: Sit to Supine       Sit to supine: Contact guard assist   General bed mobility comments: Pt returned to supine with CGA/SBA, then able to scoot to [facility] using bed rails with bed in level position    Transfers Overall transfer level: Needs assistance Equipment used: Rolling walker (2 wheels) Transfers: Sit to/from Stand, Bed to chair/wheelchair/BSC Sit to Stand: Mod assist     Step pivot transfers: Mod assist     General transfer comment: Min to Mod A, but legs do get tired quickly, tried to sit before making it fully to EOB      Balance Overall balance assessment: Needs assistance Sitting-balance support: Bilateral upper extremity supported, Feet unsupported Sitting balance-Leahy Scale: Fair Sitting balance - Comments: pt able to use washcloth to assist with LB bathing while seated EOB with CGA   Standing balance support: Bilateral upper extremity supported, During functional activity, Reliant on assistive device for balance Standing balance-Leahy Scale: Poor Standing balance comment: posterior bias, Mod A to  SPT from recliner to bed                           ADL either performed or assessed with clinical judgement   ADL Overall  ADL's : Needs assistance/impaired             Lower Body Bathing: Maximal assistance;Sitting/lateral leans;Sit to/from stand   Upper Body Dressing : Supervision/safety Upper Body Dressing Details (indicate cue type and reason): doffed shirt soiled in urine with SBA at EOB Lower Body Dressing: Maximal assistance;Sit to/from stand                       Vision         Perception         Praxis         Pertinent Vitals/Pain Pain Assessment Pain Assessment: Faces Faces Pain Scale: Hurts little more Pain Location: Chronic B/L leg pain; pt also reporting headache Pain Descriptors / Indicators: Discomfort, Headache, Throbbing, Aching Pain Intervention(s): Monitored during session, Limited activity within patient's tolerance     Extremity/Trunk Assessment Upper Extremity Assessment Upper Extremity Assessment: Generalized weakness   Lower Extremity Assessment Lower Extremity Assessment: Generalized weakness RLE Deficits / Details: hip flex 4/5, knee flex/ext 4/5, DF 4-/5 RLE Sensation: decreased light touch (per pt report) RLE Coordination: WNL LLE Deficits / Details: hip flex 4/5, knee flex 3+/5, knee ext 4/5, ankle DF 3/5 AROM LLE Sensation: WNL LLE Coordination: WNL   Cervical / Trunk Assessment Cervical / Trunk Assessment: Normal (forward head posture)   Communication Communication Communication: No apparent difficulties Cueing Techniques: Verbal cues;Tactile cues   Cognition Arousal: Alert Behavior During Therapy: WFL for tasks assessed/performed Overall Cognitive Status: Within Functional Limits for tasks assessed                                 General Comments: Pt alert, but unoriented to date     General Comments  soiled in urine in recliner on entry    Exercises     Shoulder Instructions      Home Living Family/patient expects to be discharged to:: Assisted living                             Home Equipment:  Rolling Walker (2 wheels);Wheelchair - manual   Additional Comments: [facility]      Prior Functioning/Environment Prior Level of Function : Needs assist       Physical Assist : Mobility (physical);ADLs (physical) Mobility (physical): Transfers;Gait ADLs (physical): Bathing;Dressing Mobility Comments: pt reports she is ambulatory with RW and always had someone behind her for assistance; also has a manual WC 2/2 chronic B/L leg pain ADLs Comments: pt reports mod I to Set up assist with all ADLs, unsure how reliable she is. Does say some days she needed help with dressing and had assist to get in/out of bathtub/shower        OT Problem List: Decreased strength;Pain;Decreased activity tolerance;Decreased safety awareness;Impaired balance (sitting and/or standing)      OT Treatment/Interventions: Self-care/ADL training;Therapeutic activities;Therapeutic exercise;Energy conservation;Patient/family education;Balance training    OT Goals(Current goals can be found in the care plan section) Acute Rehab OT Goals Patient Stated Goal: improve strength to return to her home OT Goal Formulation: With patient Time For Goal Achievement: 04/22/23 Potential to Achieve Goals:  Fair ADL Goals Pt Will Perform Lower Body Bathing: sitting/lateral leans;sit to/from stand;with contact guard assist Pt Will Perform Lower Body Dressing: with contact guard assist;sitting/lateral leans;sit to/from stand Pt Will Transfer to Toilet: with contact guard assist;bedside commode;stand pivot transfer;ambulating Pt Will Perform Toileting - Clothing Manipulation and hygiene: with contact guard assist;sit to/from stand Additional ADL Goal #1: Pt will demo all bed mobility tasks with MOD I and good safety to return to PLOF.  OT Frequency: Min 1X/week    Co-evaluation              AM-PAC OT "6 Clicks" Daily Activity     Outcome Measure Help from another person eating meals?: None Help from another person taking  care of personal grooming?: None Help from another person toileting, which includes using toliet, bedpan, or urinal?: A Lot Help from another person bathing (including washing, rinsing, drying)?: A Lot Help from another person to put on and taking off regular upper body clothing?: A Little Help from another person to put on and taking off regular lower body clothing?: A Lot 6 Click Score: 17   End of Session Equipment Utilized During Treatment: Rolling walker (2 wheels)  Activity Tolerance: Patient tolerated treatment well Patient left: in bed;with call bell/phone within reach;with bed alarm set  OT Visit Diagnosis: Unsteadiness on feet (R26.81);Other abnormalities of gait and mobility (R26.89);Muscle weakness (generalized) (M62.81)                Time: 4034-7425 OT Time Calculation (min): 28 min Charges:  OT General Charges $OT Visit: 1 Visit OT Evaluation $OT Eval Moderate Complexity: 1 Mod OT Treatments $Self Care/Home Management : 8-22 mins Carle Fenech, OTR/L 04/08/23, 3:57 PM Aby Gessel E Zlatan Hornback 04/08/2023, 3:52 PM

## 2023-04-08 NOTE — Care Management Obs Status (Signed)
MEDICARE OBSERVATION STATUS NOTIFICATION   Patient Details  Name: Kristen Campbell MRN: [id number] Date of Birth: 1946/04/24   Medicare Observation Status Notification Given:  Yes    Margarito Liner, LCSW 04/08/2023, 2:28 PM

## 2023-04-08 NOTE — TOC Initial Note (Signed)
Transition of Care [facility]) - Initial/Assessment Note    Patient Details  Name: Kristen Campbell MRN: [id number] Date of Birth: 02-03-1946  [facility]) CM/SW Contact:    Margarito Liner, LCSW Phone Number: 04/08/2023, 2:30 PM  Clinical Narrative:   CSW met with patient. No supports at bedside. CSW introduced role and explained that discharge planning would be discussed. Patient confirmed she is a resident at [facility]. CSW left Animator. Per Patient [name], patient is active with [facility]. Liaison is confirming. No further concerns. CSW encouraged patient to contact CSW as needed. CSW will continue to follow patient for support and facilitate return to ALF once medically stable.               Expected Discharge Plan: Assisted Living Barriers to Discharge: Continued Medical Work up   Patient Goals and CMS Choice            Expected Discharge Plan and Services     Post Acute Care Choice: Resumption of Svcs/PTA Provider Living arrangements for the past 2 months: Assisted Living Facility                                      Prior Living Arrangements/Services Living arrangements for the past 2 months: Assisted Living Facility Lives with:: Facility Resident Patient language and need for interpreter reviewed:: Yes Do you feel safe going back to the place where you live?: Yes      Need for Family Participation in Patient Care: Yes (Comment) Care giver support system in place?: Yes (comment) Current home services: DME Criminal Activity/Legal Involvement Pertinent to Current Situation/Hospitalization: No - Comment as needed  Activities of Daily Living   ADL Screening (condition at time of admission) Independently performs ADLs?: Yes (appropriate for developmental age) Is the patient deaf or have difficulty hearing?: No Does the patient have difficulty seeing, even when wearing glasses/contacts?: No Does the patient have difficulty  concentrating, remembering, or making decisions?: No  Permission Sought/Granted Permission sought to share information with : Facility Industrial/product designer granted to share information with : Yes, Verbal Permission Granted     Permission granted to share info w AGENCY: [facility]        Emotional Assessment Appearance:: Appears stated age Attitude/Demeanor/Rapport: Engaged, Gracious Affect (typically observed): Accepting, Appropriate, Calm, Pleasant Orientation: : Oriented to Self, Oriented to Place, Oriented to  Time, Oriented to Situation Alcohol / Substance Use: Not Applicable Psych Involvement: No (comment)  Admission diagnosis:  Hyperkalemia [E87.5] Weakness [R53.1] AKI (acute kidney injury) (HCC) [N17.9] Patient Active Problem List   Diagnosis Date Noted   Cerebrovascular disease    Acute on chronic kidney failure (HCC) 01/18/2020   Pneumococcal pneumonia (HCC) 01/18/2020   Bilateral leg edema 04/07/2019   Pyloric ulcer 03/12/2019   Essential hypertension 03/12/2019   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 [place], [place] - [address] [facility]  [address] [place] [address] Phone: [phone] Fax: [phone]     Social Determinants of Health (SDOH) Social History: SDOH Screenings   Food Insecurity: No Food Insecurity (04/07/2023)  Housing: Low Risk  (04/07/2023)  Transportation Needs: No Transportation Needs (04/07/2023)  Utilities:  Not At Risk (04/07/2023)  Depression (PHQ2-9): Medium Risk (04/15/2020)  Tobacco Use: Low Risk  (03/08/2023)   SDOH Interventions:     Readmission Risk Interventions     No data to display

## 2023-04-08 NOTE — Evaluation (Signed)
Physical Therapy Evaluation Patient Details Name: Rudean Azbell MRN: [id number] DOB: 02-05-1946 Today's Date: 04/08/2023  History of Present Illness  Pt is a 77 yo female presenting with AMS, increased lethargy, hypotension, and slight facial droop. Stroke code called but no significant imaging findings. 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 Pt reporting 6/10 BLE pain and headache at rest, both at beginning and end of session (RN aware and notified). Unsure as to how reliable of a historian pt is: pt living at an ALF, reports she was ambulatory with a RW and someone behind her for assistance, reports independence with ADLs, and also has bed rails and a manual WC at home. Pt reports dizziness t/o session, so vitals assessed. Supine vitals: BP 126/35, HR 63, O2 98%. Pt modA with bed mobility, STS transfers, and step pivot transfer to recliner with RW. Pt presents with posterior weightshift and significant forward trunk flexion, requiring verbal cues for sequencing and physical assist to navigate RW. Sitting vitals assessed: BP 120/44, HR 117, O2 98%. Pt limited by SOB, fatigue, weakness, dizziness, and pain. Pt left with all needs met, call bell in reach, and chair alarm set. Pt would benefit from skilled therapy to address impairments and progress toward mobility goals.    If plan is discharge home, recommend the following: A lot of help with bathing/dressing/bathroom;A lot of help with walking and/or transfers;Assistance with cooking/housework;Direct supervision/assist for medications management;Help with stairs or ramp for entrance;Assist for transportation   Can travel by private vehicle   No    Equipment Recommendations BSC/3in1  Recommendations for Other Services       Functional Status  Assessment Patient has had a recent decline in their functional status and demonstrates the ability to make significant improvements in function in a reasonable and predictable amount of time.     Precautions / Restrictions Precautions Precautions: Fall Restrictions Weight Bearing Restrictions: No      Mobility  Bed Mobility Overal bed mobility: Needs Assistance Bed Mobility: Supine to Sit     Supine to sit: Mod assist     General bed mobility comments: verbal cues to use railings, modA at trunk and to scoot pt to EOB    Transfers Overall transfer level: Needs assistance Equipment used: Rolling walker (2 wheels) Transfers: Sit to/from Stand, Bed to chair/wheelchair/BSC Sit to Stand: Mod assist   Step pivot transfers: Mod assist       General transfer comment: modA 2/2 posterior weighshift and significant forward trunk flexion, verbal cues for sequencing, and physical assist to navigate RW    Ambulation/Gait               General Gait Details: deferred 2/2 fatigue and SOB  Stairs            Wheelchair Mobility     Tilt Bed    Modified Rankin (Stroke Patients Only)       Balance Overall balance assessment: Needs assistance Sitting-balance support: Bilateral upper extremity supported, Feet unsupported Sitting balance-Leahy Scale: Poor Sitting balance - Comments: feet unable to reach floor with bed at lowest setting; minA to maintain static sitting balance Postural control: Posterior lean Standing balance support: Bilateral upper extremity supported, During functional activity, Reliant on assistive device for balance Standing balance-Leahy Scale: Poor Standing balance comment: significant posterior weighshift, requiring  modA for support                             Pertinent Vitals/Pain Pain Assessment Pain Assessment: 0-10 Pain Score: 6  Pain Location: Chronic B/L leg pain; pt also reporting headache Pain Descriptors / Indicators:  Constant, Discomfort, Headache, Throbbing, Aching Pain Intervention(s): Limited activity within patient's tolerance, Monitored during session, Repositioned    Home Living Family/patient expects to be discharged to:: Assisted living                 Home Equipment: Agricultural consultant (2 wheels);Wheelchair - manual Additional Comments: [facility]    Prior Function Prior Level of Function : Needs assist       Physical Assist : Mobility (physical) Mobility (physical): Transfers;Gait   Mobility Comments: pt reports she is ambulatory with RW and always had someone behind her for assistance; also has a manual WC 2/2 chronic B/L leg pain ADLs Comments: pt reports mod I with all ADLs, unsure how reliable she is     Extremity/Trunk Assessment   Upper Extremity Assessment Upper Extremity Assessment: Defer to OT evaluation    Lower Extremity Assessment Lower Extremity Assessment: RLE deficits/detail;LLE deficits/detail RLE Deficits / Details: hip flex 4/5, knee flex/ext 4/5, DF 4-/5 RLE Sensation: decreased light touch (per pt report) RLE Coordination: WNL LLE Deficits / Details: hip flex 4/5, knee flex 3+/5, knee ext 4/5, ankle DF 3/5 AROM LLE Sensation: WNL LLE Coordination: WNL    Cervical / Trunk Assessment Cervical / Trunk Assessment: Normal (forward head posture)  Communication   Communication Communication: No apparent difficulties Cueing Techniques: Verbal cues;Tactile cues  Cognition Arousal: Alert Behavior During Therapy: WFL for tasks assessed/performed Overall Cognitive Status: Within Functional Limits for tasks assessed                                 General Comments: Pt alert, but unoriented to date        General Comments      Exercises     Assessment/Plan    PT Assessment Patient needs continued PT services  PT Problem List Decreased strength;Decreased mobility;Decreased safety awareness;Decreased range of motion;Decreased knowledge of  precautions;Decreased activity tolerance;Decreased balance;Decreased knowledge of use of DME;Impaired sensation       PT Treatment Interventions DME instruction;Gait training;Functional mobility training;Therapeutic activities;Patient/family education;Neuromuscular re-education;Balance training;Therapeutic exercise    PT Goals (Current goals can be found in the Care Plan section)  Acute Rehab PT Goals Patient Stated Goal: to reduce pain PT Goal Formulation: With patient Time For Goal Achievement: 04/22/23 Potential to Achieve Goals: Fair    Frequency Min 1X/week     Co-evaluation               AM-PAC PT "6 Clicks" Mobility  Outcome Measure Help needed turning from your back to your side while in a flat bed without using bedrails?: A Little Help needed moving from lying on your back to sitting on the side of a flat bed without using bedrails?: A Lot Help needed moving to and from a bed to a chair (including a wheelchair)?: A Lot Help needed standing up from a chair using your arms (e.g., wheelchair or bedside chair)?: A Lot Help needed to walk in hospital room?: Total Help needed climbing 3-5 steps with a railing? : Total 6 Click Score: 11    End of Session Equipment Utilized  During Treatment: Gait belt Activity Tolerance: Patient tolerated treatment well;No increased pain;Patient limited by fatigue Patient left: in chair;with chair alarm set;with call bell/phone within reach Nurse Communication: Mobility status (B/L pain status) PT Visit Diagnosis: Unsteadiness on feet (R26.81);Repeated falls (R29.6);History of falling (Z91.81);Muscle weakness (generalized) (M62.81);Difficulty in walking, not elsewhere classified (R26.2)    Time: 0630-1601 PT Time Calculation (min) (ACUTE ONLY): 32 min   Charges:   PT Evaluation $PT Eval Low Complexity: 1 Low PT Treatments $Therapeutic Activity: 8-22 mins PT General Charges $$ ACUTE PT VISIT: 1 Visit          Shauna Hugh,  SPT 04/08/2023, 2:25 PM

## 2023-04-08 NOTE — Progress Notes (Signed)
   04/08/23 1000  Spiritual Encounters  Type of Visit Initial  Care provided to: Patient  Referral source Nurse (RN/NT/LPN)  Reason for visit Advance directives  OnCall Visit No  Spiritual Framework  Patient Stress Factors Not reviewed  Interventions  Spiritual Care Interventions Made Established relationship of care and support;Compassionate presence  Intervention Outcomes  Outcomes Awareness of support  Spiritual Care Plan  Spiritual Care Issues Still Outstanding No further spiritual care needs at this time (see row info)   Patient did not request Ad it was place in the system upon patient arrive. Let patient know that Chaplain services are here for them if they needed. I also let educated PT on what is an advance directive pt [place] not interested in completing one.

## 2023-04-08 NOTE — Progress Notes (Signed)
[facility]  - [place] [facility]   PATIENT NAME: Kristen Campbell    MR#:  [id number]  DATE OF BIRTH:  July 23, 1945  SUBJECTIVE:   No family at bedside. Patient lives at the [place] of [address] She tells me she was weak yesterday while eating breakfast and became hypotensive. Brought to the emergency room found to have transient hypotension. Since then her blood pressure has been stable. Received some IV fluids. Her creatinine was elevated than her baseline. She has history of CKD. Denies any fever cough shortness of breath abdominal pain diarrhea.   VITALS:  Blood pressure (!) 159/57, pulse (!) 59, temperature 97.6 F (36.4 C), resp. rate 18, height 5' (1.524 m), weight 73.7 kg, SpO2 99%.  PHYSICAL EXAMINATION:   GENERAL:  77 y.o.-year-old patient with no acute distress. Obese LUNGS: Normal breath sounds bilaterally, no wheezing CARDIOVASCULAR: S1, S2 normal. No murmur   ABDOMEN: Soft, nontender, nondistended. Bowel sounds present.  EXTREMITIES: No  edema b/l.    NEUROLOGIC: nonfocal  patient is alert and awake  LABORATORY PANEL:  CBC Recent Labs  Lab 04/08/23 0653  WBC 3.0*  HGB 9.3*  HCT 29.6*  PLT 144*    Chemistries  Recent Labs  Lab 04/08/23 0653  NA 140  K 4.6  CL 108  CO2 22  GLUCOSE 113*  BUN 76*  CREATININE 2.29*  CALCIUM 8.6*  AST 14*  ALT 11  ALKPHOS 120  BILITOT 0.4   Cardiac Enzymes No results for input(s): "TROPONINI" in the last 168 hours. RADIOLOGY:  US RENAL  Result Date: 04/07/2023 CLINICAL DATA:  Acute on chronic renal failure. EXAM: RENAL / URINARY TRACT ULTRASOUND COMPLETE COMPARISON:  Renal ultrasound 01/18/2020 FINDINGS: Right Kidney: Renal measurements: 6.8 x 4.3 x 4.3 cm = volume: 65 mL. Mildly increased parenchymal echogenicity. No mass or hydronephrosis. Left Kidney: Renal measurements: 7.8 x 4.3 x 4.7 cm = volume: 81 mL. Mildly increased parenchymal echogenicity. Limited visualization of the kidney without  evidence of hydronephrosis or a gross Mass. Bladder: Appears normal for degree of bladder distention. Other: None. IMPRESSION: 1. No hydronephrosis. 2. Atrophic, mildly echogenic kidneys compatible with medical renal disease. Electronically Signed   By: Sebastian Ache M.D.   On: 04/07/2023 15:26   MR BRAIN WO CONTRAST  Result Date: 04/07/2023 CLINICAL DATA:  Neuro deficit, acute, stroke suspected. EXAM: MRI HEAD WITHOUT CONTRAST TECHNIQUE: Multiplanar, multiecho pulse sequences of the brain and surrounding structures were obtained without intravenous contrast. COMPARISON:  Head CT 04/07/2023 and MRI 12/19/2021 FINDINGS: Brain: There is no evidence of an acute infarct, intracranial hemorrhage, mass, or midline shift. Confluent T2 hyperintensities in the cerebral white matter bilaterally have progressed from the prior MRI and are nonspecific but compatible with severe chronic small vessel ischemic disease. Moderate chronic small vessel ischemia is noted in the pons. There are chronic lacunar infarcts in the right basal ganglia and bilateral thalami. A right parieto-occipital approach ventriculostomy catheter again terminates in the frontal horn of the right lateral ventricle. The ventricles are mildly larger than on the 2023 MRI but are unchanged in size from a head CT from 01/24/2023, and the change from the prior MRI may be secondary to progressive cerebral atrophy. Mild diffuse dural thickening (or less likely tiny subdural collections) is less than on the prior MRI. Vascular: Major intracranial vascular flow voids are preserved. Skull and upper cervical spine: No suspicious marrow lesion. Sinuses/Orbits: Unremarkable orbits. No significant inflammatory changes in the paranasal sinuses. Small right and trace  left mastoid effusions. Other: None. IMPRESSION: 1. No acute intracranial abnormality. 2. Severe chronic small vessel ischemic disease. Electronically Signed   By: Sebastian Ache M.D.   On: 04/07/2023 15:24    DG Abd 1 View  Result Date: 04/07/2023 CLINICAL DATA:  Altered mental status. Evaluate for shunt malfunction. EXAM: ABDOMEN - 1 VIEW COMPARISON:  Abdominal radiograph 03/12/2019 FINDINGS: VP shunt tubing is again seen coursing through the lateral aspect of the right hemithorax into the abdomen and currently terminates in the left upper quadrant (previously coiled in the pelvis and 2020). This tubing appears intact. Old, abandoned, calcified shunt tubing coursing from the midline of the chest into the right mid abdomen is unchanged. Surgical clips are noted in the epigastric region. No dilated loops of bowel are seen to suggest obstruction. No acute osseous abnormality is identified. IMPRESSION: VP shunt tubing appears intact in the lower chest and abdomen. Electronically Signed   By: Sebastian Ache M.D.   On: 04/07/2023 15:18   DG Cervical Spine 1 View  Result Date: 04/07/2023 CLINICAL DATA:  Altered mental status. EXAM: DG CERVICAL SPINE - 1 VIEW; SKULL - 1-3 VIEW COMPARISON:  CT head 04/07/2023. CT cervical spine 03/08/2023. Skull radiographs 03/29/2020. FINDINGS: A right parieto-occipital approach ventriculostomy is again seen terminating in the region of the frontal horn of the right lateral ventricle. Two sets of tubing are again seen coursing from the skull through the neck, one of which is calcified and disconnected. The connected, in-use tubing appears intact. Postoperative changes are noted from C5-C7 ACDF. Multilevel cervical facet arthropathy is noted. IMPRESSION: VP shunt tubing in the head and neck appears intact. Electronically Signed   By: Sebastian Ache M.D.   On: 04/07/2023 15:15   DG Skull 1-3 Views  Result Date: 04/07/2023 CLINICAL DATA:  Altered mental status. EXAM: DG CERVICAL SPINE - 1 VIEW; SKULL - 1-3 VIEW COMPARISON:  CT head 04/07/2023. CT cervical spine 03/08/2023. Skull radiographs 03/29/2020. FINDINGS: A right parieto-occipital approach ventriculostomy is again seen  terminating in the region of the frontal horn of the right lateral ventricle. Two sets of tubing are again seen coursing from the skull through the neck, one of which is calcified and disconnected. The connected, in-use tubing appears intact. Postoperative changes are noted from C5-C7 ACDF. Multilevel cervical facet arthropathy is noted. IMPRESSION: VP shunt tubing in the head and neck appears intact. Electronically Signed   By: Sebastian Ache M.D.   On: 04/07/2023 15:15   DG Chest 1 View  Result Date: 04/07/2023 CLINICAL DATA:  77 year old female with altered mental status EXAM: CHEST  1 VIEW COMPARISON:  03/29/2020 FINDINGS: Cardiomediastinal silhouette unchanged in size and contour. No evidence of central vascular congestion. No interlobular septal thickening. No pneumothorax or pleural effusion. Coarsened interstitial markings, with no confluent airspace disease. No acute displaced fracture. Degenerative changes of the spine. There are 2 surgical tubing projecting over the right hemithorax, both of them terminating out of the field of view. The more medial projecting over the midline demonstrates circumferential calcifications. Unchanged appearance dating to the plain film 03/29/2020. Surgical changes of cervical region incompletely imaged. IMPRESSION: Negative for acute cardiopulmonary disease. There are 2 separate shunt tubing of the right hemithorax which are unchanged from the comparison plain film. Both of them terminate out of the field of view Electronically Signed   By: Gilmer Mor D.O.   On: 04/07/2023 15:08   CT HEAD CODE STROKE WO CONTRAST  Result Date: 04/07/2023 CLINICAL DATA:  Code  stroke.  Neuro deficit, acute, stroke suspected EXAM: CT HEAD WITHOUT CONTRAST TECHNIQUE: Contiguous axial images were obtained from the base of the skull through the vertex without intravenous contrast. RADIATION DOSE REDUCTION: This exam was performed according to the departmental dose-optimization program  which includes automated exposure control, adjustment of the mA and/or kV according to patient size and/or use of iterative reconstruction technique. COMPARISON:  CT head 03/08/2023. FINDINGS: Brain: No evidence of acute infarction, hemorrhage, hydrocephalus, extra-axial collection or mass lesion/mass effect. Stable position of the right VP shunt with similar ventricular size. Patchy white matter hypodensities are nonspecific but compatible chronic microvascular disease. Remote right basal ganglia and left thalamic infarcts. Vascular: No hyperdense vessel. Skull: No acute fracture. Sinuses/Orbits: Clear sinuses.  No acute orbital findings. Other: No mastoid effusions. ASPECTS [facility] Stroke Program Early CT Score) total score (0-10 with 10 being normal): 10. IMPRESSION: 1. No evidence of acute large vascular territory infarct or acute hemorrhage. 2. Chronic microvascular disease. MRI could provide more sensitive evaluation for acute infarct if warranted. 3. Stable position of the right VP shunt with similar ventricular size. Electronically Signed   By: Feliberto Harts M.D.   On: 04/07/2023 10:09    Assessment and Plan  Kristen Campbell is a 77 y.o. female with medical history significant of HTN, CVA, CKD, dementia presenting w/ encephalopathy, AoCKI.  Limited history in setting of encephalopathy.  Per report, patient had her breakfast as well as medications earlier today when patient became lethargic at facility.  Last well-known around 7:45 AM.  Patient was unable to feed herself speak or hold objects.  No reported fevers or chills.  No reported nausea or vomiting.  No reported chest pain shortness of breath.  No abdominal pain.  Remote history of CVA in the past.   Code stroke was initiated. Stroke workup was negative with negative MRI of the brain. She has old strokes.  Acute metabolic encephalopathy suspected due to acute on chronic kidney disease stage IIIB with dehydration -patient's baseline  creatinine is around 1.822. -- Came in with creatinine of more than three. Received IV fluids.- Creatinine down to 2.2.  -- Vital signs stable with blood pressure trending up -- MRI brain negative for acute intracranial abnormality.old strokes noted. --PT OT to see patient. -- No overt signs of infection noted. 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 Lorenso Courier.  VP shunt series pending     History of CVA (cerebrovascular accident) --Remote hx/o CVA  --MRI brain nothing acute   Essential hypertension BP stable  On amlodipine  TOC for discharge planning   Procedures: Family communication : none Consults : none CODE STATUS: full DVT Prophylaxis : Lovenox Level of care: Med-Surg Status is: Observation     TOTAL TIME TAKING CARE OF THIS PATIENT: 35 minutes.  >50% time spent on counselling and coordination of care  Note: This dictation was prepared with Dragon dictation along with smaller phrase technology. Any transcriptional errors that result from this process are unintentional.  Enedina Finner M.D    [facility]   CC: Primary care physician; Emi Belfast, FNP

## 2023-04-08 NOTE — Plan of Care (Signed)

## 2023-04-09 ENCOUNTER — Encounter: Payer: Self-pay | Admitting: Family Medicine

## 2023-04-09 DIAGNOSIS — R29810 Facial weakness: Secondary | ICD-10-CM | POA: Diagnosis present

## 2023-04-09 DIAGNOSIS — Z79899 Other long term (current) drug therapy: Secondary | ICD-10-CM | POA: Diagnosis not present

## 2023-04-09 DIAGNOSIS — R633 Feeding difficulties, unspecified: Secondary | ICD-10-CM | POA: Diagnosis present

## 2023-04-09 DIAGNOSIS — M79604 Pain in right leg: Secondary | ICD-10-CM | POA: Diagnosis present

## 2023-04-09 DIAGNOSIS — I129 Hypertensive chronic kidney disease with stage 1 through stage 4 chronic kidney disease, or unspecified chronic kidney disease: Secondary | ICD-10-CM | POA: Diagnosis present

## 2023-04-09 DIAGNOSIS — Z8673 Personal history of transient ischemic attack (TIA), and cerebral infarction without residual deficits: Secondary | ICD-10-CM | POA: Diagnosis not present

## 2023-04-09 DIAGNOSIS — Z982 Presence of cerebrospinal fluid drainage device: Secondary | ICD-10-CM | POA: Diagnosis not present

## 2023-04-09 DIAGNOSIS — F32A Depression, unspecified: Secondary | ICD-10-CM | POA: Diagnosis present

## 2023-04-09 DIAGNOSIS — G9341 Metabolic encephalopathy: Secondary | ICD-10-CM | POA: Diagnosis present

## 2023-04-09 DIAGNOSIS — Z885 Allergy status to narcotic agent status: Secondary | ICD-10-CM | POA: Diagnosis not present

## 2023-04-09 DIAGNOSIS — N179 Acute kidney failure, unspecified: Secondary | ICD-10-CM | POA: Diagnosis present

## 2023-04-09 DIAGNOSIS — D696 Thrombocytopenia, unspecified: Secondary | ICD-10-CM | POA: Diagnosis not present

## 2023-04-09 DIAGNOSIS — R471 Dysarthria and anarthria: Secondary | ICD-10-CM | POA: Diagnosis present

## 2023-04-09 DIAGNOSIS — E669 Obesity, unspecified: Secondary | ICD-10-CM | POA: Diagnosis present

## 2023-04-09 DIAGNOSIS — Z6831 Body mass index (BMI) 31.0-31.9, adult: Secondary | ICD-10-CM | POA: Diagnosis not present

## 2023-04-09 DIAGNOSIS — F0150 Vascular dementia without behavioral disturbance: Secondary | ICD-10-CM | POA: Diagnosis present

## 2023-04-09 DIAGNOSIS — E875 Hyperkalemia: Secondary | ICD-10-CM | POA: Diagnosis present

## 2023-04-09 DIAGNOSIS — N1832 Chronic kidney disease, stage 3b: Secondary | ICD-10-CM

## 2023-04-09 DIAGNOSIS — D72819 Decreased white blood cell count, unspecified: Secondary | ICD-10-CM | POA: Diagnosis not present

## 2023-04-09 DIAGNOSIS — G8929 Other chronic pain: Secondary | ICD-10-CM | POA: Diagnosis present

## 2023-04-09 DIAGNOSIS — I2510 Atherosclerotic heart disease of native coronary artery without angina pectoris: Secondary | ICD-10-CM | POA: Diagnosis present

## 2023-04-09 DIAGNOSIS — Z888 Allergy status to other drugs, medicaments and biological substances status: Secondary | ICD-10-CM | POA: Diagnosis not present

## 2023-04-09 DIAGNOSIS — Z791 Long term (current) use of non-steroidal anti-inflammatories (NSAID): Secondary | ICD-10-CM | POA: Diagnosis not present

## 2023-04-09 MED ORDER — SODIUM CHLORIDE 0.9 % IV SOLN: INTRAVENOUS | Status: AC

## 2023-04-09 NOTE — Plan of Care (Signed)

## 2023-04-09 NOTE — Plan of Care (Signed)

## 2023-04-09 NOTE — Progress Notes (Signed)
Occupational Therapy Treatment Patient Details Name: Kristen Campbell MRN: [id number] DOB: July 30, 1945 Today's Date: 04/09/2023   History of present illness Pt is a 77 yo female presenting with AMS, increased lethargy, hypotension, and slight facial droop. Stroke code called but no significant imaging findings. 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 Pt is supine in bed on arrival. Easily arousable and agreeable to session. She reports pain in her legs and recently given meds for relief. She needed MOD A for bed mobility to reach EOB. STS and x2 mobility trials in room with CGA and chair follow for distance of ~10 feet each trial. Leg pain most limiting factor. Cues for hand placement to push up from bed and recliner versus pulling on RW. Pt not oriented to place, time or situation, but alert and able to follow directions. Pt left seated in recliner with all needs in place and will cont to require skilled acute OT services to maximize her safety and IND to return to PLOF. If history is accurate she provided, she had assist at all times for mobility at ALF and appears to be able to return with [facility] therapy services.       If plan is discharge home, recommend the following:  Assistance with cooking/housework;Help with stairs or ramp for entrance;Direct supervision/assist for medications management;Direct supervision/assist for financial management;A little help with walking and/or transfers;A little help with bathing/dressing/bathroom   Equipment Recommendations  Other (comment) (defer)    Recommendations for Other Services      Precautions / Restrictions Restrictions Weight Bearing Restrictions: No       Mobility Bed Mobility Overal bed mobility: Needs Assistance Bed  Mobility: Sit to Supine     Supine to sit: Mod assist     General bed mobility comments: MOD A for supine to sit at EOB    Transfers Overall transfer level: Needs assistance Equipment used: Rolling walker (2 wheels) Transfers: Sit to/from Stand Sit to Stand: Contact guard assist, +2 safety/equipment           General transfer comment: CGA for STS from EOB and recliner, although x1 stand attempt from recliner after mobility pt needed Min A, but sat back on chair then able to stand with CGA     Balance Overall balance assessment: Needs assistance Sitting-balance support: Bilateral upper extremity supported, Feet unsupported Sitting balance-Leahy Scale: Fair Sitting balance - Comments: steady on EOB for seated balance with SBA   Standing balance support: Bilateral upper extremity supported, During functional activity, Reliant on assistive device for balance Standing balance-Leahy Scale: Fair Standing balance comment: CGA for balance in standing with dependence on RW                           ADL either performed or assessed with clinical judgement   ADL Overall ADL's : Needs assistance/impaired                                     Functional mobility during ADLs: Contact guard assist;Rolling walker (2 wheels);Cueing for safety      Extremity/Trunk Assessment Upper Extremity Assessment Upper Extremity Assessment: Generalized weakness   Lower Extremity Assessment Lower Extremity Assessment:  Generalized weakness        Vision       Perception     Praxis      Cognition Arousal: Alert Behavior During Therapy: WFL for tasks assessed/performed Overall Cognitive Status: No family/caregiver present to determine baseline cognitive functioning                                 General Comments: alert, not oriented to location of hospital, year or month. Knew her age        Exercises      Shoulder Instructions        General Comments      Pertinent Vitals/ Pain       Pain Assessment Pain Assessment: Faces Faces Pain Scale: Hurts little more Pain Location: RLE pain Pain Descriptors / Indicators: Discomfort, Aching Pain Intervention(s): Monitored during session, Limited activity within patient's tolerance  Home Living                                          Prior Functioning/Environment              Frequency  Min 1X/week        Progress Toward Goals  OT Goals(current goals can now be found in the care plan section)  Progress towards OT goals: Progressing toward goals  Acute Rehab OT Goals Patient Stated Goal: improve strength OT Goal Formulation: With patient Time For Goal Achievement: 04/22/23 Potential to Achieve Goals: Good  Plan      Co-evaluation    PT/OT/SLP Co-Evaluation/Treatment: Yes Reason for Co-Treatment: For patient/therapist safety;To address functional/ADL transfers PT goals addressed during session: Mobility/safety with mobility OT goals addressed during session: ADL's and self-care      AM-PAC OT "6 Clicks" Daily Activity     Outcome Measure   Help from another person eating meals?: None Help from another person taking care of personal grooming?: None Help from another person toileting, which includes using toliet, bedpan, or urinal?: A Little Help from another person bathing (including washing, rinsing, drying)?: A Little Help from another person to put on and taking off regular upper body clothing?: A Little Help from another person to put on and taking off regular lower body clothing?: A Lot 6 Click Score: 19    End of Session Equipment Utilized During Treatment: Rolling walker (2 wheels)  OT Visit Diagnosis: Unsteadiness on feet (R26.81);Other abnormalities of gait and mobility (R26.89);Muscle weakness (generalized) (M62.81)   Activity Tolerance Patient tolerated treatment well   Patient Left with chair alarm set;with call  bell/phone within reach;in chair   Nurse Communication          Time: 1610-9604 OT Time Calculation (min): 23 min  Charges: OT General Charges $OT Visit: 1 Visit OT Treatments $Therapeutic Activity: 8-22 mins  Loras Grieshop, OTR/L  04/09/23, 3:16 PM Naveya Ellerman E Kiah Vanalstine 04/09/2023, 3:13 PM

## 2023-04-09 NOTE — Progress Notes (Signed)
Progress Note    Kristen Campbell  YQM:[id number] DOB: [birth year]  DOA: 04/07/2023 PCP: Emi Belfast, FNP      Brief Narrative:    Medical records reviewed and are as summarized below:  Kristen Campbell is a 77 y.o. female with medical history significant of HTN, CVA, CKD, dementia, brain aneurysm, s/p VP shunt, who was [place] to the emergency department because of weakness, altered mental status (drowsiness) trouble holding a spoon and trouble speaking.  Reportedly, she was hypotensive when EMS arrived.  She was admitted to the hospital for acute metabolic encephalopathy and AKI on CKD stage IIIb.  No evidence of acute stroke on MRI brain.  She was treated with IV fluids.     Assessment/Plan:   Principal Problem:   Acute on chronic kidney failure (HCC) Active Problems:   Encephalopathy   Essential hypertension   History of CVA (cerebrovascular accident)   S/P VP shunt   Body mass index is 31.73 kg/m.  (Obesity)   Acute metabolic encephalopathy with underlying dementia: Patient appears back to her baseline.  Continue supportive care   AKI on CKD stage IIIb: Creatinine is trending down (down from 3.12-2.29).  Restart IV fluids for hydration.  Repeat BMP tomorrow.  Baseline creatinine around 1.6   Mild thrombocytopenia and leukopenia: Repeat CBC tomorrow   History of stroke: No acute stroke on MRI brain.   Comorbidities include hypertension, s/p VP shunt with shunt revision in November 2016 (VP shunt tubing appears intact in the skull, lower chest and abdomen on VP shunt series), remote history of aneurysm with secondary hydrocephalus.    Diet Order             Diet heart healthy/carb modified Room service appropriate? Yes; Fluid consistency: Thin  Diet effective now                            Consultants: Neurologist  Procedures: None    Medications:    amLODipine  5 mg Oral Daily   DULoxetine  60 mg  Oral Daily   enoxaparin (LOVENOX) injection  30 mg Subcutaneous Q24H   pantoprazole  40 mg Oral Daily   pregabalin  50 mg Oral TID   QUEtiapine  12.5 mg Oral BID   Continuous Infusions:   Anti-infectives (From admission, onward)    None              Family Communication/Anticipated D/C date and plan/Code Status   DVT prophylaxis: enoxaparin (LOVENOX) injection 30 mg Start: 04/07/23 2200     Code Status: Full Code  Family Communication: None Disposition Plan: Plan to discharge to SNF   Status is: Observation The patient will require care spanning > 2 midnights and should be moved to inpatient because: AKI       Subjective:   Interval events noted.  She complains of " I feel tired and I just want to sleep".  She still has some confusion   Objective:    Vitals:   04/08/23 2040 04/09/23 0018 04/09/23 0420 04/09/23 0822  BP: 98/85 (!) 103/52 (!) 144/54 120/83  Pulse: 67 (!) 58 (!) 58   Resp: 18 17 18 15   Temp: 98.3 F (36.8 C) 98 F (36.7 C) 97.7 F (36.5 C) 98.5 F (36.9 C)  TempSrc:      SpO2: 100% 97% 97%   Weight:      Height:  No data found.   Intake/Output Summary (Last 24 hours) at 04/09/2023 1306 Last data filed at 04/09/2023 1100 Gross per 24 hour  Intake 0 ml  Output 400 ml  Net -400 ml   Filed Weights   04-08-23 0944 Apr 08, 2023 2154  Weight: 73.4 kg 73.7 kg    Exam:  GEN: NAD SKIN: Warm and dry EYES: No pallor or icterus ENT: MMM CV: RRR PULM: CTA B ABD: soft, ND, NT, +BS CNS: AAO x 1 (person)., non focal EXT: No edema or tenderness        Data Reviewed:   I have personally reviewed following labs and imaging studies:  Labs: Labs show the following:   Basic Metabolic Panel: Recent Labs  Lab 08-Apr-2023 0945 04/08/2023 1455 04/08/23 0653  NA 138 141 140  K 5.7* 4.4 4.6  CL 108 108 108  CO2 23 24 22   GLUCOSE 103* 97 113*  BUN 94* 84* 76*  CREATININE 3.12* 2.65* 2.29*  CALCIUM 8.8* 8.5* 8.6*    GFR Estimated Creatinine Clearance: 19 mL/min (A) (by C-G formula based on SCr of 2.29 mg/dL (H)). Liver Function Tests: Recent Labs  Lab 04/08/23 0945 04/08/23 0653  AST 14* 14*  ALT 10 11  ALKPHOS 129* 120  BILITOT 0.6 0.4  PROT 7.4 6.4*  ALBUMIN 3.9 3.5   No results for input(s): "LIPASE", "AMYLASE" in the last 168 hours. No results for input(s): "AMMONIA" in the last 168 hours. Coagulation profile Recent Labs  Lab April 08, 2023 0945  INR 1.1    CBC: Recent Labs  Lab 2023-04-08 0945 04/08/23 0653  WBC 4.8 3.0*  NEUTROABS 3.3  --   HGB 9.5* 9.3*  HCT 30.8* 29.6*  MCV 89.3 89.7  PLT 160 144*   Cardiac Enzymes: Recent Labs  Lab 04-08-2023 1155  CKTOTAL 69   BNP (last 3 results) No results for input(s): "PROBNP" in the last 8760 hours. CBG: Recent Labs  Lab 04-08-2023 1544  GLUCAP 116*   D-Dimer: No results for input(s): "DDIMER" in the last 72 hours. Hgb A1c: No results for input(s): "HGBA1C" in the last 72 hours. Lipid Profile: No results for input(s): "CHOL", "HDL", "LDLCALC", "TRIG", "CHOLHDL", "LDLDIRECT" in the last 72 hours. Thyroid function studies: No results for input(s): "TSH", "T4TOTAL", "T3FREE", "THYROIDAB" in the last 72 hours.  Invalid input(s): "FREET3" Anemia work up: No results for input(s): "VITAMINB12", "FOLATE", "FERRITIN", "TIBC", "IRON", "RETICCTPCT" in the last 72 hours. Sepsis Labs: Recent Labs  Lab 04/08/2023 0945 04/08/23 0653  WBC 4.8 3.0*    Microbiology No results found for this or any previous visit (from the past 240 hour(s)).  Procedures and diagnostic studies:  US RENAL  Result Date: 04/08/2023 CLINICAL DATA:  Acute on chronic renal failure. EXAM: RENAL / URINARY TRACT ULTRASOUND COMPLETE COMPARISON:  Renal ultrasound 01/18/2020 FINDINGS: Right Kidney: Renal measurements: 6.8 x 4.3 x 4.3 cm = volume: 65 mL. Mildly increased parenchymal echogenicity. No mass or hydronephrosis. Left Kidney: Renal measurements: 7.8 x  4.3 x 4.7 cm = volume: 81 mL. Mildly increased parenchymal echogenicity. Limited visualization of the kidney without evidence of hydronephrosis or a gross Mass. Bladder: Appears normal for degree of bladder distention. Other: None. IMPRESSION: 1. No hydronephrosis. 2. Atrophic, mildly echogenic kidneys compatible with medical renal disease. Electronically Signed   By: Sebastian Ache M.D.   On: 04/08/2023 15:26   MR BRAIN WO CONTRAST  Result Date: April 08, 2023 CLINICAL DATA:  Neuro deficit, acute, stroke suspected. EXAM: MRI HEAD WITHOUT CONTRAST TECHNIQUE: Multiplanar,  multiecho pulse sequences of the brain and surrounding structures were obtained without intravenous contrast. COMPARISON:  Head CT 04/07/2023 and MRI 12/19/2021 FINDINGS: Brain: There is no evidence of an acute infarct, intracranial hemorrhage, mass, or midline shift. Confluent T2 hyperintensities in the cerebral white matter bilaterally have progressed from the prior MRI and are nonspecific but compatible with severe chronic small vessel ischemic disease. Moderate chronic small vessel ischemia is noted in the pons. There are chronic lacunar infarcts in the right basal ganglia and bilateral thalami. A right parieto-occipital approach ventriculostomy catheter again terminates in the frontal horn of the right lateral ventricle. The ventricles are mildly larger than on the 2023 MRI but are unchanged in size from a head CT from 01/24/2023, and the change from the prior MRI may be secondary to progressive cerebral atrophy. Mild diffuse dural thickening (or less likely tiny subdural collections) is less than on the prior MRI. Vascular: Major intracranial vascular flow voids are preserved. Skull and upper cervical spine: No suspicious marrow lesion. Sinuses/Orbits: Unremarkable orbits. No significant inflammatory changes in the paranasal sinuses. Small right and trace left mastoid effusions. Other: None. IMPRESSION: 1. No acute intracranial abnormality. 2.  Severe chronic small vessel ischemic disease. Electronically Signed   By: Sebastian Ache M.D.   On: 04/07/2023 15:24               LOS: 0 days   Brady Schiller  [facility]   Pager on [email or web address]. If 7PM-7AM, please contact night-coverage at [email or web address]     04/09/2023, 1:06 PM

## 2023-04-09 NOTE — Progress Notes (Signed)
Physical Therapy Treatment Patient Details Name: Kristen Campbell MRN: [id number] DOB: [birth year] Today's Date: 04/09/2023   History of Present Illness Pt is a 77 yo female presenting with AMS, increased lethargy, hypotension, and slight facial droop. Stroke code called but no significant imaging findings. 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 Seen in conjunction with OT to maximize patient's tolerance and gait progression. Patient required modA for bed mobility but CGA for sit to stand from low bed surface and recliner. One instance of posterior LOB with standing requiring minA to recover and able to stand on next attempt with CGA with cues for hand placement. Ambulated 86' + 15' with RW and CGA + chair follow. Updated discharge plan to reflect current functional status. Would benefit from return to ALF if facility is able to provide necessary assistance.    If plan is discharge home, recommend the following: A lot of help with bathing/dressing/bathroom;A lot of help with walking and/or transfers;Assistance with cooking/housework;Direct supervision/assist for medications management;Help with stairs or ramp for entrance;Assist for transportation   Can travel by private vehicle     No  Equipment Recommendations  BSC/3in1    Recommendations for Other Services       Precautions / Restrictions Restrictions Weight Bearing Restrictions: No     Mobility  Bed Mobility Overal bed mobility: Needs Assistance Bed Mobility: Supine to Sit     Supine to sit: Mod assist          Transfers Overall transfer level: Needs assistance Equipment used: Rolling Jimeka Balan (2 wheels) Transfers: Sit to/from Stand Sit to Stand: Contact guard assist, +2  safety/equipment                Ambulation/Gait Ambulation/Gait assistance: Contact guard assist, +2 safety/equipment Gait Distance (Feet): 15 Feet (+15') Assistive device: Rolling Riely Oetken (2 wheels) Gait Pattern/deviations: Step-to pattern, Decreased stride length Gait velocity: decreased     General Gait Details: CGA with chair follow. No overt LOB. General unsteadiness and patient reporting LLE pain.   Stairs             Wheelchair Mobility     Tilt Bed    Modified Rankin (Stroke Patients Only)       Balance Overall balance assessment: Needs assistance Sitting-balance support: Bilateral upper extremity supported, Feet unsupported Sitting balance-Leahy Scale: Fair     Standing balance support: Bilateral upper extremity supported, During functional activity, Reliant on assistive device for balance Standing balance-Leahy Scale: Fair Standing balance comment: CGA for balance in standing with dependence on RW                            Cognition Arousal: Alert Behavior During Therapy: WFL for tasks assessed/performed Overall Cognitive Status: No family/caregiver present to determine baseline cognitive functioning                                 General Comments: alert, not oriented to location of hospital, year or month. Knew her age        Exercises      General Comments        Pertinent Vitals/Pain Pain Assessment Pain Assessment: Faces Faces  Pain Scale: Hurts little more Pain Location: RLE pain Pain Descriptors / Indicators: Discomfort, Aching Pain Intervention(s): Monitored during session, Limited activity within patient's tolerance    Home Living                          Prior Function            PT Goals (current goals can now be found in the care plan section) Acute Rehab PT Goals PT Goal Formulation: With patient Time For Goal Achievement: 04/22/23 Potential to Achieve Goals: Fair Progress  towards PT goals: Progressing toward goals    Frequency    Min 1X/week      PT Plan      Co-evaluation PT/OT/SLP Co-Evaluation/Treatment: Yes Reason for Co-Treatment: For patient/therapist safety;To address functional/ADL transfers PT goals addressed during session: Mobility/safety with mobility OT goals addressed during session: ADL's and self-care      AM-PAC PT "6 Clicks" Mobility   Outcome Measure  Help needed turning from your back to your side while in a flat bed without using bedrails?: A Little Help needed moving from lying on your back to sitting on the side of a flat bed without using bedrails?: A Lot Help needed moving to and from a bed to a chair (including a wheelchair)?: A Little Help needed standing up from a chair using your arms (e.g., wheelchair or bedside chair)?: A Little Help needed to walk in hospital room?: A Little Help needed climbing 3-5 steps with a railing? : A Lot 6 Click Score: 16    End of Session Equipment Utilized During Treatment: Gait belt Activity Tolerance: Patient tolerated treatment well;No increased pain Patient left: in chair;with chair alarm set;with call bell/phone within reach Nurse Communication: Mobility status PT Visit Diagnosis: Unsteadiness on feet (R26.81);Repeated falls (R29.6);History of falling (Z91.81);Muscle weakness (generalized) (M62.81);Difficulty in walking, not elsewhere classified (R26.2)     Time: 4332-9518 PT Time Calculation (min) (ACUTE ONLY): 23 min  Charges:    $Therapeutic Activity: 8-22 mins PT General Charges $$ ACUTE PT VISIT: 1 Visit                     Maylon Peppers, PT, DPT Physical Therapist - [facility]  [facility]    Cris Talavera A Madysun Thall 04/09/2023, 3:39 PM

## 2023-04-10 ENCOUNTER — Inpatient Hospital Stay: Payer: Medicare Other

## 2023-04-10 DIAGNOSIS — N1832 Chronic kidney disease, stage 3b: Secondary | ICD-10-CM | POA: Diagnosis not present

## 2023-04-10 DIAGNOSIS — N179 Acute kidney failure, unspecified: Secondary | ICD-10-CM | POA: Diagnosis not present

## 2023-04-10 LAB — CBC WITH DIFFERENTIAL/PLATELET
Abs Immature Granulocytes: 0.01 10*3/uL (ref 0.00–0.07)
Basophils Absolute: 0 10*3/uL (ref 0.0–0.1)
Basophils Relative: 1 %
Eosinophils Absolute: 0.4 10*3/uL (ref 0.0–0.5)
Eosinophils Relative: 11 %
HCT: 30.9 % — ABNORMAL LOW (ref 36.0–46.0)
Hemoglobin: 9.7 g/dL — ABNORMAL LOW (ref 12.0–15.0)
Immature Granulocytes: 0 %
Lymphocytes Relative: 41 %
Lymphs Abs: 1.4 10*3/uL (ref 0.7–4.0)
MCH: 27.5 pg (ref 26.0–34.0)
MCHC: 31.4 g/dL (ref 30.0–36.0)
MCV: 87.5 fL (ref 80.0–100.0)
Monocytes Absolute: 0.3 10*3/uL (ref 0.1–1.0)
Monocytes Relative: 8 %
Neutro Abs: 1.3 10*3/uL — ABNORMAL LOW (ref 1.7–7.7)
Neutrophils Relative %: 39 %
Platelets: 147 10*3/uL — ABNORMAL LOW (ref 150–400)
RBC: 3.53 MIL/uL — ABNORMAL LOW (ref 3.87–5.11)
RDW: 14 % (ref 11.5–15.5)
WBC: 3.3 10*3/uL — ABNORMAL LOW (ref 4.0–10.5)
nRBC: 0 % (ref 0.0–0.2)

## 2023-04-10 LAB — BASIC METABOLIC PANEL
Anion gap: 10 (ref 5–15)
BUN: 62 mg/dL — ABNORMAL HIGH (ref 8–23)
CO2: 23 mmol/L (ref 22–32)
Calcium: 8.9 mg/dL (ref 8.9–10.3)
Chloride: 106 mmol/L (ref 98–111)
Creatinine, Ser: 2.02 mg/dL — ABNORMAL HIGH (ref 0.44–1.00)
GFR, Estimated: 25 mL/min — ABNORMAL LOW (ref >60)
Glucose, Bld: 83 mg/dL (ref 70–99)
Potassium: 4.1 mmol/L (ref 3.5–5.1)
Sodium: 139 mmol/L (ref 135–145)

## 2023-04-10 MED ORDER — ACETAMINOPHEN 325 MG PO TABS
650.0000 mg | ORAL_TABLET | Freq: Four times a day (QID) | ORAL | Status: DC | PRN
  Administered 2023-04-10 – 2023-04-11 (×2): 650 mg via ORAL
  Filled 2023-04-10 (×2): qty 2

## 2023-04-10 NOTE — Plan of Care (Signed)

## 2023-04-10 NOTE — Progress Notes (Signed)
Patient was transported to ultrasound in bed at 1028 in stable condition.  Patient returned to unit from ultrasound in bed in stable condition at 1249.

## 2023-04-10 NOTE — Progress Notes (Signed)
Progress Note    Kristen Campbell  NGE:[id number] DOB: [birth year]  DOA: 04/07/2023 PCP: Emi Belfast, FNP      Brief Narrative:    Medical records reviewed and are as summarized below:  Kristen Campbell is a 77 y.o. female with medical history significant of HTN, CVA, CKD, dementia, brain aneurysm, s/p VP shunt, who was brought from [place] to the emergency department because of weakness, altered mental status (drowsiness) trouble holding a spoon and trouble speaking.  Reportedly, she was hypotensive when EMS arrived.  She was admitted to the hospital for acute metabolic encephalopathy and AKI on CKD stage IIIb.  No evidence of acute stroke on MRI brain.  She was treated with IV fluids.     Assessment/Plan:   Principal Problem:   Acute on chronic kidney failure (HCC) Active Problems:   Encephalopathy   Essential hypertension   History of CVA (cerebrovascular accident)   S/P VP shunt   AKI (acute kidney injury) (HCC)   Body mass index is 31.73 kg/m.  (Obesity)   Acute metabolic encephalopathy with underlying dementia: Patient appears back to her baseline.  Continue supportive care   AKI on CKD stage IIIb: Improving.  Creatinine continues to trend downward.  (down from 3.12-2.29-2.02).  Discontinue IV fluids and monitor BMP.  Baseline creatinine around 1.6   Mild thrombocytopenia and leukopenia: Improving.   Right leg pain: Obtain venous duplex of the lower extremities to rule out DVT.   History of stroke: No acute stroke on MRI brain.   Comorbidities include hypertension, s/p VP shunt with shunt revision in November 2016 (VP shunt tubing appears intact in the skull, lower chest and abdomen on VP shunt series), remote history of aneurysm with secondary hydrocephalus.    Diet Order             Diet heart healthy/carb modified Room service appropriate? Yes; Fluid consistency: Thin  Diet effective now                             Consultants: Neurologist  Procedures: None    Medications:    amLODipine  5 mg Oral Daily   DULoxetine  60 mg Oral Daily   enoxaparin (LOVENOX) injection  30 mg Subcutaneous Q24H   pantoprazole  40 mg Oral Daily   pregabalin  50 mg Oral TID   QUEtiapine  12.5 mg Oral BID   Continuous Infusions:  sodium chloride 75 mL/hr at 04/10/23 4010     Anti-infectives (From admission, onward)    None              Family Communication/Anticipated D/C date and plan/Code Status   DVT prophylaxis: enoxaparin (LOVENOX) injection 30 mg Start: 04/07/23 2200     Code Status: Full Code  Family Communication: None Disposition Plan: Plan to discharge to SNF   Status is: Inpatient Remains inpatient appropriate because: AKI,         Subjective:   Interval events noted.  She complains of pain on the right leg.  No chest pain or shortness of breath.  She feels better today except for pain in the right leg.  Objective:    Vitals:   04/09/23 2027 04/10/23 0443 04/10/23 0746 04/10/23 0909  BP: (!) 163/67 (!) 143/56 (!) 156/58 (!) 131/50  Pulse: 66 (!) 56 (!) 57 62  Resp: 16 16 16 16   Temp: 98.2 F (36.8 C) 98.5 F (36.9 C)  98.4 F (36.9 C) 98.3 F (36.8 C)  TempSrc:    Oral  SpO2: 96% 96% 97% 100%  Weight:      Height:       No data found.   Intake/Output Summary (Last 24 hours) at 04/10/2023 1001 Last data filed at 04/10/2023 0909 Gross per 24 hour  Intake 906.25 ml  Output 1300 ml  Net -393.75 ml   Filed Weights   04/07/23 0944 04/07/23 2154  Weight: 73.4 kg 73.7 kg    Exam:  GEN: NAD SKIN: Warm and dry EYES: No pallor or icterus ENT: MMM CV: RRR PULM: CTA B ABD: soft, ND, NT, +BS CNS: AAO x 3, non focal EXT: Right calf and right anterior thigh tenderness.  No swelling or erythema in the lower extremities       Data Reviewed:   I have personally reviewed following labs and imaging studies:  Labs: Labs show  the following:   Basic Metabolic Panel: Recent Labs  Lab 04/07/23 0945 04/07/23 1455 04/08/23 0653 04/10/23 0412  NA 138 141 140 139  K 5.7* 4.4 4.6 4.1  CL 108 108 108 106  CO2 23 24 22 23   GLUCOSE 103* 97 113* 83  BUN 94* 84* 76* 62*  CREATININE 3.12* 2.65* 2.29* 2.02*  CALCIUM 8.8* 8.5* 8.6* 8.9   GFR Estimated Creatinine Clearance: 21.6 mL/min (A) (by C-G formula based on SCr of 2.02 mg/dL (H)). Liver Function Tests: Recent Labs  Lab 04/07/23 0945 04/08/23 0653  AST 14* 14*  ALT 10 11  ALKPHOS 129* 120  BILITOT 0.6 0.4  PROT 7.4 6.4*  ALBUMIN 3.9 3.5   No results for input(s): "LIPASE", "AMYLASE" in the last 168 hours. No results for input(s): "AMMONIA" in the last 168 hours. Coagulation profile Recent Labs  Lab 04/07/23 0945  INR 1.1    CBC: Recent Labs  Lab 04/07/23 0945 04/08/23 0653 04/10/23 0412  WBC 4.8 3.0* 3.3*  NEUTROABS 3.3  --  1.3*  HGB 9.5* 9.3* 9.7*  HCT 30.8* 29.6* 30.9*  MCV 89.3 89.7 87.5  PLT 160 144* 147*   Cardiac Enzymes: Recent Labs  Lab 04/07/23 1155  CKTOTAL 69   BNP (last 3 results) No results for input(s): "PROBNP" in the last 8760 hours. CBG: Recent Labs  Lab 04/07/23 1544  GLUCAP 116*   D-Dimer: No results for input(s): "DDIMER" in the last 72 hours. Hgb A1c: No results for input(s): "HGBA1C" in the last 72 hours. Lipid Profile: No results for input(s): "CHOL", "HDL", "LDLCALC", "TRIG", "CHOLHDL", "LDLDIRECT" in the last 72 hours. Thyroid function studies: No results for input(s): "TSH", "T4TOTAL", "T3FREE", "THYROIDAB" in the last 72 hours.  Invalid input(s): "FREET3" Anemia work up: No results for input(s): "VITAMINB12", "FOLATE", "FERRITIN", "TIBC", "IRON", "RETICCTPCT" in the last 72 hours. Sepsis Labs: Recent Labs  Lab 04/07/23 0945 04/08/23 0653 04/10/23 0412  WBC 4.8 3.0* 3.3*    Microbiology No results found for this or any previous visit (from the past 240 hour(s)).  Procedures and  diagnostic studies:  No results found.             LOS: 1 day   Zarius Furr  [facility]   Pager on [email or web address]. If 7PM-7AM, please contact night-coverage at [email or web address]     04/10/2023, 10:01 AM

## 2023-04-11 DIAGNOSIS — N1832 Chronic kidney disease, stage 3b: Secondary | ICD-10-CM | POA: Diagnosis not present

## 2023-04-11 DIAGNOSIS — N179 Acute kidney failure, unspecified: Secondary | ICD-10-CM | POA: Diagnosis not present

## 2023-04-11 LAB — CBC
HCT: 31 % — ABNORMAL LOW (ref 36.0–46.0)
Hemoglobin: 9.9 g/dL — ABNORMAL LOW (ref 12.0–15.0)
MCH: 27.8 pg (ref 26.0–34.0)
MCHC: 31.9 g/dL (ref 30.0–36.0)
MCV: 87.1 fL (ref 80.0–100.0)
Platelets: 168 10*3/uL (ref 150–400)
RBC: 3.56 MIL/uL — ABNORMAL LOW (ref 3.87–5.11)
RDW: 13.9 % (ref 11.5–15.5)
WBC: 4 10*3/uL (ref 4.0–10.5)
nRBC: 0 % (ref 0.0–0.2)

## 2023-04-11 LAB — BASIC METABOLIC PANEL
Anion gap: 7 (ref 5–15)
BUN: 50 mg/dL — ABNORMAL HIGH (ref 8–23)
CO2: 22 mmol/L (ref 22–32)
Calcium: 8.8 mg/dL — ABNORMAL LOW (ref 8.9–10.3)
Chloride: 111 mmol/L (ref 98–111)
Creatinine, Ser: 1.91 mg/dL — ABNORMAL HIGH (ref 0.44–1.00)
GFR, Estimated: 27 mL/min — ABNORMAL LOW (ref >60)
Glucose, Bld: 92 mg/dL (ref 70–99)
Potassium: 4.2 mmol/L (ref 3.5–5.1)
Sodium: 140 mmol/L (ref 135–145)

## 2023-04-11 NOTE — Progress Notes (Addendum)
Physical Therapy Treatment Patient Details Name: Kristen Campbell MRN: [id number] DOB: 1945-06-28 Today's Date: 04/11/2023   History of Present Illness Pt is a 77 yo female presenting with AMS, increased lethargy, hypotension, and slight facial droop. Stroke code called but no significant imaging findings. 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  She struggles to stand from commode and needs min assist and cues for hand placements.  Unable to perform her own self care and needs full assist.  She walks out of bathroom and around bed to recliner.  C/o pain RLE which she stated has increased from prior sessions.  Short shuffling steps with flexed knees and generally weak gait quality.  She could not have walked much further today and declined further mobility.  She did do minimal seated ex BLE with mod cues.    Pt stated she lives at ALF.  Unable to recall name but in chart it mentions [facility] [address]  She stated she only receives occasional assist and is unsure if someone would be available to help her for all transfers and mobility.  If facility can provide support for all mobility and bathroom return is appropriate.  If not, SNF consideration should be made.  Will reach out to [facility] as pt is unsafe to walk/transfer on her own and cannot attend to her own bathroom needs if ALF cannot accommodate.   If plan is discharge home, recommend the following: A lot of help with bathing/dressing/bathroom;A lot of help with walking and/or transfers;Assistance with cooking/housework;Direct supervision/assist for medications management;Help with stairs or ramp for entrance;Assist for transportation   Can travel by private vehicle        Equipment Recommendations  BSC/3in1    Recommendations for Other Services        Precautions / Restrictions Precautions Precautions: Fall Restrictions Weight Bearing Restrictions: No     Mobility  Bed Mobility               General bed mobility comments: on commode then remained up in chair after session    Transfers Overall transfer level: Needs assistance Equipment used: Rolling walker (2 wheels) Transfers: Sit to/from Stand Sit to Stand: Min assist                Ambulation/Gait Ambulation/Gait assistance: Contact guard assist, Min assist Gait Distance (Feet): 15 Feet Assistive device: Rolling walker (2 wheels) Gait Pattern/deviations: Trunk flexed, Knees buckling, Step-through pattern, Decreased step length - right, Decreased step length - left, Shuffle Gait velocity: decreased     General Gait Details: pt on BSC upon arrival with tech.  stayed to assist pt.  she struggles to walk around bed to recliner with short shuffling gait and flexed knees.   Stairs             Wheelchair Mobility     Tilt Bed    Modified Rankin (Stroke Patients Only)       Balance Overall balance assessment: Needs assistance Sitting-balance support: Bilateral upper extremity supported, Feet unsupported Sitting balance-Leahy Scale: Fair     Standing balance support: Bilateral upper extremity supported, During functional activity, Reliant on assistive device for balance Standing balance-Leahy Scale: Poor  Cognition Arousal: Alert Behavior During Therapy: WFL for tasks assessed/performed Overall Cognitive Status: No family/caregiver present to determine baseline cognitive functioning                                          Exercises      General Comments        Pertinent Vitals/Pain Pain Assessment Pain Assessment: Faces Faces Pain Scale: Hurts even more Pain Location: RLE pain Pain Descriptors / Indicators: Discomfort, Aching Pain Intervention(s): Limited activity within  patient's tolerance, Monitored during session, Repositioned    Home Living                          Prior Function            PT Goals (current goals can now be found in the care plan section) Progress towards PT goals: Progressing toward goals    Frequency    Min 1X/week      PT Plan      Co-evaluation              AM-PAC PT "6 Clicks" Mobility   Outcome Measure  Help needed turning from your back to your side while in a flat bed without using bedrails?: A Little Help needed moving from lying on your back to sitting on the side of a flat bed without using bedrails?: A Lot Help needed moving to and from a bed to a chair (including a wheelchair)?: A Little Help needed standing up from a chair using your arms (e.g., wheelchair or bedside chair)?: A Little Help needed to walk in hospital room?: A Little Help needed climbing 3-5 steps with a railing? : Total 6 Click Score: 15    End of Session Equipment Utilized During Treatment: Gait belt Activity Tolerance: Patient limited by pain;Patient limited by fatigue Patient left: in chair;with chair alarm set;with call bell/phone within reach Nurse Communication: Mobility status PT Visit Diagnosis: Unsteadiness on feet (R26.81);Repeated falls (R29.6);History of falling (Z91.81);Muscle weakness (generalized) (M62.81);Difficulty in walking, not elsewhere classified (R26.2)     Time: 1610-9604 PT Time Calculation (min) (ACUTE ONLY): 10 min  Charges:    $Gait Training: 8-22 mins PT General Charges $$ ACUTE PT VISIT: 1 Visit                   Danielle Dess, PTA 04/11/23, 2:13 PM

## 2023-04-11 NOTE — Progress Notes (Signed)
Progress Note    Kristen Campbell  NWG:[id number] DOB: 1945-08-14  DOA: 04/07/2023 PCP: Emi Belfast, FNP      Brief Narrative:    Medical records reviewed and are as summarized below:  Kristen Campbell is a 77 y.o. female with medical history significant of HTN, CVA, CKD, dementia, brain aneurysm, s/p VP shunt, who was [place] to the emergency department because of weakness, altered mental status (drowsiness) trouble holding a spoon and trouble speaking.  Reportedly, she was hypotensive when EMS arrived.  She was admitted to the hospital for acute metabolic encephalopathy and AKI on CKD stage IIIb.  No evidence of acute stroke on MRI brain.  She was treated with IV fluids.     Assessment/Plan:   Principal Problem:   Acute on chronic kidney failure (HCC) Active Problems:   Encephalopathy   Essential hypertension   History of CVA (cerebrovascular accident)   S/P VP shunt   AKI (acute kidney injury) (HCC)   Body mass index is 31.73 kg/m.  (Obesity)   Acute metabolic encephalopathy with underlying dementia: Patient appears back to her baseline.  Continue supportive care   AKI on CKD stage IIIb: Improving.  Creatinine continues to trend downward.  (down from 3.12-2.29-2.02-1.91).   Baseline creatinine around 1.6   Mild thrombocytopenia and leukopenia: Improved   Right leg pain: Venous duplex of lower extremities did not show any DVT.  Patient has chronic back pain for years and Darl Pikes, daughter, confirmed that patient has complained of right leg pain for over a year and this was not significant.  She also said patient has had back surgeries.  Chart review shows that patient has had cervical spondylosis, spinal canal and facet osteoarthritis and has had ACDF surgery in the past.  Outpatient follow-up with PCP/neurosurgeon was recommended.   History of stroke: No acute stroke on MRI brain.   Comorbidities include hypertension, s/p VP shunt  with shunt revision in November 2016 (VP shunt tubing appears intact in the skull, lower chest and abdomen on VP shunt series), remote history of aneurysm with secondary hydrocephalus.   Patient is medically stable for discharge to SNF.  Awaiting placement.   Diet Order             Diet heart healthy/carb modified Room service appropriate? Yes; Fluid consistency: Thin  Diet effective now                            Consultants: Neurologist  Procedures: None    Medications:    amLODipine  5 mg Oral Daily   DULoxetine  60 mg Oral Daily   enoxaparin (LOVENOX) injection  30 mg Subcutaneous Q24H   pantoprazole  40 mg Oral Daily   pregabalin  50 mg Oral TID   QUEtiapine  12.5 mg Oral BID   Continuous Infusions:     Anti-infectives (From admission, onward)    None              Family Communication/Anticipated D/C date and plan/Code Status   DVT prophylaxis: enoxaparin (LOVENOX) injection 30 mg Start: 04/07/23 2200     Code Status: Full Code  Family Communication: Plan discussed with Darl Pikes, daughter, over the phone Disposition Plan: Plan to discharge to SNF   Status is: Inpatient Remains inpatient appropriate because: Awaiting placement to SNF         Subjective:   Interval events noted.  She complains of  pain in the right leg.  Patient said she said right leg pain for a long time although yesterday she said pain in the right leg was new.  Review of systems positive for chronic back pain with pain radiating down the right thigh and right leg and sometimes associated with numbness of the right leg.  Objective:    Vitals:   04/10/23 1329 04/10/23 1950 04/11/23 0433 04/11/23 0814  BP: (!) 144/55 (!) 121/55 129/66 (!) 154/62  Pulse: 60 65  60  Resp: 18 17 16 16   Temp: 98.1 F (36.7 C) 98.3 F (36.8 C) 98 F (36.7 C) 97.9 F (36.6 C)  TempSrc: Oral Oral Oral   SpO2: 99% 98% 96% 98%  Weight:      Height:       No data  found.   Intake/Output Summary (Last 24 hours) at 04/11/2023 1150 Last data filed at 04/10/2023 1709 Gross per 24 hour  Intake 240 ml  Output 250 ml  Net -10 ml   Filed Weights   04/07/23 0944 04/07/23 2154  Weight: 73.4 kg 73.7 kg    Exam:  GEN: NAD SKIN: Warm and dry EYES: No pallor or icterus ENT: MMM CV: RRR PULM: CTA B ABD: soft, ND, NT, +BS CNS: AAO x 3, non focal EXT: No swelling or erythema in the lower extremities.  Mild right calf tenderness      Data Reviewed:   I have personally reviewed following labs and imaging studies:  Labs: Labs show the following:   Basic Metabolic Panel: Recent Labs  Lab 04/07/23 0945 04/07/23 1455 04/08/23 0653 04/10/23 0412 04/11/23 0804  NA 138 141 140 139 140  K 5.7* 4.4 4.6 4.1 4.2  CL 108 108 108 106 111  CO2 23 24 22 23 22   GLUCOSE 103* 97 113* 83 92  BUN 94* 84* 76* 62* 50*  CREATININE 3.12* 2.65* 2.29* 2.02* 1.91*  CALCIUM 8.8* 8.5* 8.6* 8.9 8.8*   GFR Estimated Creatinine Clearance: 22.8 mL/min (A) (by C-G formula based on SCr of 1.91 mg/dL (H)). Liver Function Tests: Recent Labs  Lab 04/07/23 0945 04/08/23 0653  AST 14* 14*  ALT 10 11  ALKPHOS 129* 120  BILITOT 0.6 0.4  PROT 7.4 6.4*  ALBUMIN 3.9 3.5   No results for input(s): "LIPASE", "AMYLASE" in the last 168 hours. No results for input(s): "AMMONIA" in the last 168 hours. Coagulation profile Recent Labs  Lab 04/07/23 0945  INR 1.1    CBC: Recent Labs  Lab 04/07/23 0945 04/08/23 0653 04/10/23 0412 04/11/23 0804  WBC 4.8 3.0* 3.3* 4.0  NEUTROABS 3.3  --  1.3*  --   HGB 9.5* 9.3* 9.7* 9.9*  HCT 30.8* 29.6* 30.9* 31.0*  MCV 89.3 89.7 87.5 87.1  PLT 160 144* 147* 168   Cardiac Enzymes: Recent Labs  Lab 04/07/23 1155  CKTOTAL 69   BNP (last 3 results) No results for input(s): "PROBNP" in the last 8760 hours. CBG: Recent Labs  Lab 04/07/23 1544  GLUCAP 116*   D-Dimer: No results for input(s): "DDIMER" in the last 72  hours. Hgb A1c: No results for input(s): "HGBA1C" in the last 72 hours. Lipid Profile: No results for input(s): "CHOL", "HDL", "LDLCALC", "TRIG", "CHOLHDL", "LDLDIRECT" in the last 72 hours. Thyroid function studies: No results for input(s): "TSH", "T4TOTAL", "T3FREE", "THYROIDAB" in the last 72 hours.  Invalid input(s): "FREET3" Anemia work up: No results for input(s): "VITAMINB12", "FOLATE", "FERRITIN", "TIBC", "IRON", "RETICCTPCT" in the last 72 hours.  Sepsis Labs: Recent Labs  Lab 04/07/23 0945 04/08/23 0653 04/10/23 0412 04/11/23 0804  WBC 4.8 3.0* 3.3* 4.0    Microbiology No results found for this or any previous visit (from the past 240 hour(s)).  Procedures and diagnostic studies:  US Venous Img Lower Bilateral (DVT)  Result Date: 04/10/2023 CLINICAL DATA:  Right leg pain EXAM: BILATERAL LOWER EXTREMITY VENOUS DOPPLER ULTRASOUND TECHNIQUE: Gray-scale sonography with compression, as well as color and duplex ultrasound, were performed to evaluate the deep venous system(s) from the level of the common femoral vein through the popliteal and proximal calf veins. COMPARISON:  None Available. FINDINGS: VENOUS Normal compressibility of the common femoral, superficial femoral, and popliteal veins, as well as the visualized calf veins. Visualized portions of profunda femoral vein and great saphenous vein unremarkable. No filling defects to suggest DVT on grayscale or color Doppler imaging. Doppler waveforms show normal direction of venous flow, normal respiratory plasticity and response to augmentation. OTHER None. Limitations: none IMPRESSION: 1. No evidence of deep venous thrombosis within either lower extremity. Electronically Signed   By: Sharlet Salina M.D.   On: 04/10/2023 16:39               LOS: 2 days   Aqueelah Cotrell  [facility]   Pager on [email or web address]. If 7PM-7AM, please contact night-coverage at [email or web address]     04/11/2023, 11:50 AM

## 2023-04-12 DIAGNOSIS — N179 Acute kidney failure, unspecified: Secondary | ICD-10-CM | POA: Diagnosis not present

## 2023-04-12 DIAGNOSIS — N1832 Chronic kidney disease, stage 3b: Secondary | ICD-10-CM | POA: Diagnosis not present

## 2023-04-12 MED ORDER — FUROSEMIDE 20 MG PO TABS: 20.0000 mg | ORAL_TABLET | Freq: Every day | ORAL | Status: AC

## 2023-04-12 NOTE — Care Management Important Message (Signed)
Important Message  Patient Details  Name: Kristen Campbell MRN: [id number] Date of Birth: 08-11-1945   Important Message Given:  N/A - LOS <3 / Initial given by admissions     [name] 04/12/2023, 11:28 AM

## 2023-04-12 NOTE — Discharge Summary (Signed)
Physician Discharge Summary   Patient: Kristen Campbell MRN: [id number] DOB: 07-28-1945  Admit date:     04/07/2023  Discharge date: 04/12/23  Discharge Physician: Lurene Shadow   PCP: Emi Belfast, FNP   Recommendations at discharge:   Follow-up with PCP in 1 week  Discharge Diagnoses: Principal Problem:   Acute on chronic kidney failure (HCC) Active Problems:   Encephalopathy   Essential hypertension   History of CVA (cerebrovascular accident)   S/P VP shunt   AKI (acute kidney injury) (HCC)  Resolved Problems:   * No resolved hospital problems. *  Hospital Course:   Kristen Campbell is a 77 y.o. female with medical history significant of HTN, CVA, CKD, dementia, brain aneurysm, s/p VP shunt, who was [place] to the emergency department because of weakness, altered mental status (drowsiness) trouble holding a spoon and trouble speaking.  Reportedly, she was hypotensive when EMS arrived.   She was admitted to the hospital for acute metabolic encephalopathy and AKI on CKD stage IIIb.  No evidence of acute stroke on MRI brain.  She was treated with IV fluids.        Assessment and Plan:  Acute metabolic encephalopathy with underlying dementia: Patient appears back to her baseline.  Continue supportive care     AKI on CKD stage IIIb: Creatinine has improved (down from 3.12-2.29-2.02-1.91).   Baseline creatinine around 1.6 Discontinued meloxicam because of recent AKI    Mild thrombocytopenia and leukopenia: Improved     Right leg pain: Venous duplex of lower extremities did not show any DVT.  Patient has chronic back pain for years and Darl Pikes, daughter, confirmed that patient has complained of right leg pain for over a year and this was not significant.  She also said patient has had back surgeries.  Chart review shows that patient has had cervical spondylosis, spinal canal and facet osteoarthritis and has had ACDF surgery in the past.  Outpatient  follow-up with PCP/neurosurgeon was recommended.     History of stroke: No acute stroke on MRI brain.     Comorbidities include hypertension, s/p VP shunt with shunt revision in November 2016 (VP shunt tubing appears intact in the skull, lower chest and abdomen on VP shunt series), remote history of aneurysm with secondary hydrocephalus.     Her condition has improved and she is deemed stable for discharge to the assisted living facility today. I called Darl Pikes, daughter, on her phone but there was no response.        Consultants: None Procedures performed: None Disposition: Assisted living Diet recommendation:  Cardiac diet DISCHARGE MEDICATION: Allergies as of 04/12/2023       Reactions   Carbamazepine Other (See Comments)   Hallucinations/ reaction to Tegretol   Dilantin [phenytoin] Other (See Comments)   MD said never to take it - unknown reaction   Potassium Chloride Itching, Other (See Comments)   Reaction to KlorCon   Ibuprofen Itching   S/w daughter on 04/07/23 and confirmed that she does not have an allergy to ibuprofen, she states that her MD does not want her to take ibuprofen due to ulcers but okay to take tylenol    Codeine Nausea And Vomiting        Medication List     STOP taking these medications    HYDROmorphone 2 MG tablet Commonly known as: DILAUDID   meloxicam 15 MG tablet Commonly known as: MOBIC       TAKE these medications  DULoxetine 60 MG capsule Commonly known as: CYMBALTA Take 60 mg by mouth daily.   furosemide 20 MG tablet Commonly known as: LASIX Take 1 tablet (20 mg total) by mouth daily. What changed: when to take this   loratadine 10 MG tablet Commonly known as: CLARITIN Take 10 mg by mouth daily as needed for allergies.   LORazepam 0.5 MG tablet Commonly known as: ATIVAN Take 0.5 mg by mouth every 8 (eight) hours as needed for anxiety.   omeprazole 20 MG capsule Commonly known as: PRILOSEC Take 20 mg by mouth  daily.   potassium chloride 20 MEQ/15ML (10%) Soln Take 15 mLs by mouth 2 (two) times daily.   pregabalin 50 MG capsule Commonly known as: LYRICA Take 50 mg by mouth 3 (three) times daily.   QUEtiapine 25 MG tablet Commonly known as: SEROQUEL Take 12.5 mg by mouth 2 (two) times daily.   traMADol 50 MG tablet Commonly known as: ULTRAM Take 50 mg by mouth every 6 (six) hours as needed for moderate pain (pain score 4-6) or severe pain (pain score 7-10). Use first before trying dilaudid (hydromorphone)        Discharge Exam: Filed Weights   04/07/23 0944 04/07/23 2154  Weight: 73.4 kg 73.7 kg   GEN: NAD SKIN: Warm and dry EYES: No pallor or icterus ENT: MMM CV: RRR PULM: CTA B ABD: soft, ND, NT, +BS CNS: AAO x 3, non focal EXT: No edema or tenderness   Condition at discharge: good  The results of significant diagnostics from this hospitalization (including imaging, microbiology, ancillary and laboratory) are listed below for reference.   Imaging Studies: US Venous Img Lower Bilateral (DVT)  Result Date: 04/10/2023 CLINICAL DATA:  Right leg pain EXAM: BILATERAL LOWER EXTREMITY VENOUS DOPPLER ULTRASOUND TECHNIQUE: Gray-scale sonography with compression, as well as color and duplex ultrasound, were performed to evaluate the deep venous system(s) from the level of the common femoral vein through the popliteal and proximal calf veins. COMPARISON:  None Available. FINDINGS: VENOUS Normal compressibility of the common femoral, superficial femoral, and popliteal veins, as well as the visualized calf veins. Visualized portions of profunda femoral vein and great saphenous vein unremarkable. No filling defects to suggest DVT on grayscale or color Doppler imaging. Doppler waveforms show normal direction of venous flow, normal respiratory plasticity and response to augmentation. OTHER None. Limitations: none IMPRESSION: 1. No evidence of deep venous thrombosis within either lower  extremity. Electronically Signed   By: Sharlet Salina M.D.   On: 04/10/2023 16:39   US RENAL  Result Date: 04/07/2023 CLINICAL DATA:  Acute on chronic renal failure. EXAM: RENAL / URINARY TRACT ULTRASOUND COMPLETE COMPARISON:  Renal ultrasound 01/18/2020 FINDINGS: Right Kidney: Renal measurements: 6.8 x 4.3 x 4.3 cm = volume: 65 mL. Mildly increased parenchymal echogenicity. No mass or hydronephrosis. Left Kidney: Renal measurements: 7.8 x 4.3 x 4.7 cm = volume: 81 mL. Mildly increased parenchymal echogenicity. Limited visualization of the kidney without evidence of hydronephrosis or a gross Mass. Bladder: Appears normal for degree of bladder distention. Other: None. IMPRESSION: 1. No hydronephrosis. 2. Atrophic, mildly echogenic kidneys compatible with medical renal disease. Electronically Signed   By: Sebastian Ache M.D.   On: 04/07/2023 15:26   MR BRAIN WO CONTRAST  Result Date: 04/07/2023 CLINICAL DATA:  Neuro deficit, acute, stroke suspected. EXAM: MRI HEAD WITHOUT CONTRAST TECHNIQUE: Multiplanar, multiecho pulse sequences of the brain and surrounding structures were obtained without intravenous contrast. COMPARISON:  Head CT 04/07/2023 and MRI 12/19/2021  FINDINGS: Brain: There is no evidence of an acute infarct, intracranial hemorrhage, mass, or midline shift. Confluent T2 hyperintensities in the cerebral white matter bilaterally have progressed from the prior MRI and are nonspecific but compatible with severe chronic small vessel ischemic disease. Moderate chronic small vessel ischemia is noted in the pons. There are chronic lacunar infarcts in the right basal ganglia and bilateral thalami. A right parieto-occipital approach ventriculostomy catheter again terminates in the frontal horn of the right lateral ventricle. The ventricles are mildly larger than on the 2023 MRI but are unchanged in size from a head CT from 01/24/2023, and the change from the prior MRI may be secondary to progressive cerebral  atrophy. Mild diffuse dural thickening (or less likely tiny subdural collections) is less than on the prior MRI. Vascular: Major intracranial vascular flow voids are preserved. Skull and upper cervical spine: No suspicious marrow lesion. Sinuses/Orbits: Unremarkable orbits. No significant inflammatory changes in the paranasal sinuses. Small right and trace left mastoid effusions. Other: None. IMPRESSION: 1. No acute intracranial abnormality. 2. Severe chronic small vessel ischemic disease. Electronically Signed   By: Sebastian Ache M.D.   On: 04/07/2023 15:24   DG Abd 1 View  Result Date: 04/07/2023 CLINICAL DATA:  Altered mental status. Evaluate for shunt malfunction. EXAM: ABDOMEN - 1 VIEW COMPARISON:  Abdominal radiograph 03/12/2019 FINDINGS: VP shunt tubing is again seen coursing through the lateral aspect of the right hemithorax into the abdomen and currently terminates in the left upper quadrant (previously coiled in the pelvis and 2020). This tubing appears intact. Old, abandoned, calcified shunt tubing coursing from the midline of the chest into the right mid abdomen is unchanged. Surgical clips are noted in the epigastric region. No dilated loops of bowel are seen to suggest obstruction. No acute osseous abnormality is identified. IMPRESSION: VP shunt tubing appears intact in the lower chest and abdomen. Electronically Signed   By: Sebastian Ache M.D.   On: 04/07/2023 15:18   DG Cervical Spine 1 View  Result Date: 04/07/2023 CLINICAL DATA:  Altered mental status. EXAM: DG CERVICAL SPINE - 1 VIEW; SKULL - 1-3 VIEW COMPARISON:  CT head 04/07/2023. CT cervical spine 03/08/2023. Skull radiographs 03/29/2020. FINDINGS: A right parieto-occipital approach ventriculostomy is again seen terminating in the region of the frontal horn of the right lateral ventricle. Two sets of tubing are again seen coursing from the skull through the neck, one of which is calcified and disconnected. The connected, in-use tubing  appears intact. Postoperative changes are noted from C5-C7 ACDF. Multilevel cervical facet arthropathy is noted. IMPRESSION: VP shunt tubing in the head and neck appears intact. Electronically Signed   By: Sebastian Ache M.D.   On: 04/07/2023 15:15   DG Skull 1-3 Views  Result Date: 04/07/2023 CLINICAL DATA:  Altered mental status. EXAM: DG CERVICAL SPINE - 1 VIEW; SKULL - 1-3 VIEW COMPARISON:  CT head 04/07/2023. CT cervical spine 03/08/2023. Skull radiographs 03/29/2020. FINDINGS: A right parieto-occipital approach ventriculostomy is again seen terminating in the region of the frontal horn of the right lateral ventricle. Two sets of tubing are again seen coursing from the skull through the neck, one of which is calcified and disconnected. The connected, in-use tubing appears intact. Postoperative changes are noted from C5-C7 ACDF. Multilevel cervical facet arthropathy is noted. IMPRESSION: VP shunt tubing in the head and neck appears intact. Electronically Signed   By: Sebastian Ache M.D.   On: 04/07/2023 15:15   DG Chest 1 View  Result Date:  04/07/2023 CLINICAL DATA:  77 year old female with altered mental status EXAM: CHEST  1 VIEW COMPARISON:  03/29/2020 FINDINGS: Cardiomediastinal silhouette unchanged in size and contour. No evidence of central vascular congestion. No interlobular septal thickening. No pneumothorax or pleural effusion. Coarsened interstitial markings, with no confluent airspace disease. No acute displaced fracture. Degenerative changes of the spine. There are 2 surgical tubing projecting over the right hemithorax, both of them terminating out of the field of view. The more medial projecting over the midline demonstrates circumferential calcifications. Unchanged appearance dating to the plain film 03/29/2020. Surgical changes of cervical region incompletely imaged. IMPRESSION: Negative for acute cardiopulmonary disease. There are 2 separate shunt tubing of the right hemithorax which are  unchanged from the comparison plain film. Both of them terminate out of the field of view Electronically Signed   By: Gilmer Mor D.O.   On: 04/07/2023 15:08   CT HEAD CODE STROKE WO CONTRAST  Result Date: 04/07/2023 CLINICAL DATA:  Code stroke.  Neuro deficit, acute, stroke suspected EXAM: CT HEAD WITHOUT CONTRAST TECHNIQUE: Contiguous axial images were obtained from the base of the skull through the vertex without intravenous contrast. RADIATION DOSE REDUCTION: This exam was performed according to the departmental dose-optimization program which includes automated exposure control, adjustment of the mA and/or kV according to patient size and/or use of iterative reconstruction technique. COMPARISON:  CT head 03/08/2023. FINDINGS: Brain: No evidence of acute infarction, hemorrhage, hydrocephalus, extra-axial collection or mass lesion/mass effect. Stable position of the right VP shunt with similar ventricular size. Patchy white matter hypodensities are nonspecific but compatible chronic microvascular disease. Remote right basal ganglia and left thalamic infarcts. Vascular: No hyperdense vessel. Skull: No acute fracture. Sinuses/Orbits: Clear sinuses.  No acute orbital findings. Other: No mastoid effusions. ASPECTS [facility] Stroke Program Early CT Score) total score (0-10 with 10 being normal): 10. IMPRESSION: 1. No evidence of acute large vascular territory infarct or acute hemorrhage. 2. Chronic microvascular disease. MRI could provide more sensitive evaluation for acute infarct if warranted. 3. Stable position of the right VP shunt with similar ventricular size. Electronically Signed   By: Feliberto Harts M.D.   On: 04/07/2023 10:09    Microbiology: Results for orders placed or performed during the hospital encounter of 01/17/20  SARS Coronavirus 2 by RT PCR (hospital order, performed in [facility] hospital lab) Nasopharyngeal Nasopharyngeal Swab     Status: None   Collection Time: 01/18/20  5:16 AM    Specimen: Nasopharyngeal Swab  Result Value Ref Range Status   SARS Coronavirus 2 NEGATIVE NEGATIVE Final    Comment: (NOTE) SARS-CoV-2 target nucleic acids are NOT DETECTED.  The SARS-CoV-2 RNA is generally detectable in upper and lower respiratory specimens during the acute phase of infection. The lowest concentration of SARS-CoV-2 viral copies this assay can detect is 250 copies / mL. A negative result does not preclude SARS-CoV-2 infection and should not be used as the sole basis for treatment or other patient management decisions.  A negative result may occur with improper specimen collection / handling, submission of specimen other than nasopharyngeal swab, presence of viral mutation(s) within the areas targeted by this assay, and inadequate number of viral copies (<250 copies / mL). A negative result must be combined with clinical observations, patient history, and epidemiological information.  Fact Sheet for Patients:   BoilerBrush.com.cy  Fact Sheet for Healthcare Providers: https://pope.com/  This test is not yet approved or  cleared by the [place] FDA and has been authorized for detection  and/or diagnosis of SARS-CoV-2 by FDA under an Emergency Use Authorization (EUA).  This EUA will remain in effect (meaning this test can be used) for the duration of the COVID-19 declaration under Section 564(b)(1) of the Act, 21 U.S.C. section 360bbb-3(b)(1), unless the authorization is terminated or revoked sooner.  Performed at [facility], [address], [place], [place] [address]     Labs: CBC: Recent Labs  Lab 04/07/23 0945 04/08/23 0653 04/10/23 0412 04/11/23 0804  WBC 4.8 3.0* 3.3* 4.0  NEUTROABS 3.3  --  1.3*  --   HGB 9.5* 9.3* 9.7* 9.9*  HCT 30.8* 29.6* 30.9* 31.0*  MCV 89.3 89.7 87.5 87.1  PLT 160 144* 147* 168   Basic Metabolic Panel: Recent Labs  Lab 04/07/23 0945 04/07/23 1455  04/08/23 0653 04/10/23 0412 04/11/23 0804  NA 138 141 140 139 140  K 5.7* 4.4 4.6 4.1 4.2  CL 108 108 108 106 111  CO2 23 24 22 23 22   GLUCOSE 103* 97 113* 83 92  BUN 94* 84* 76* 62* 50*  CREATININE 3.12* 2.65* 2.29* 2.02* 1.91*  CALCIUM 8.8* 8.5* 8.6* 8.9 8.8*   Liver Function Tests: Recent Labs  Lab 04/07/23 0945 04/08/23 0653  AST 14* 14*  ALT 10 11  ALKPHOS 129* 120  BILITOT 0.6 0.4  PROT 7.4 6.4*  ALBUMIN 3.9 3.5   CBG: Recent Labs  Lab 04/07/23 1544  GLUCAP 116*    Discharge time spent: greater than 30 minutes.  Signed: Lurene Shadow, MD [facility] 04/12/2023

## 2023-04-12 NOTE — Progress Notes (Addendum)
Physical Therapy Treatment Patient Details Name: Kristen Campbell MRN: [id number] DOB: [birth year] Today's Date: 04/12/2023   History of Present Illness Pt is a 77 yo female presenting with AMS, increased lethargy, hypotension, and slight facial droop. Stroke code called but no significant imaging findings. 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  She is inc of urine and BM in bed and needs assist for care  she needs mod a x 1 to transition to EOB with bed features.  Steady in sitting.  Stands to RW with min/mod a x 1 and is able to take several poor quality steps to chair with encouragement not to sit prematurely. Cues for hand placements.  She stands a second time and is able to stand for several moments with legs on chair for support but is unable to do any standing ex or progress gait.  Pt continues with decreased mobility.  Lives at an ALF.  Reached out to confirm ALF can provide +1 assist for all mobility.  If they cannot accommodate her assist level, pt will need to transition to SNF upon discharge for further strengthening and mobility for a safe transition home.  At this point, pt would be wheelchair level for room and facility mobility and limited to transfers only with +1 assist along with assist for bathroom.   If plan is discharge home, recommend the following: A lot of help with bathing/dressing/bathroom;A lot of help with walking and/or transfers;Assistance with cooking/housework;Direct supervision/assist for medications management;Help with stairs or ramp for entrance;Assist for transportation   Can travel by private vehicle        Equipment Recommendations  BSC/3in1    Recommendations for Other Services       Precautions / Restrictions Precautions Precautions:  Fall Restrictions Weight Bearing Restrictions: No     Mobility  Bed Mobility Overal bed mobility: Needs Assistance Bed Mobility: Supine to Sit     Supine to sit: Mod assist       Patient Response: Cooperative  Transfers Overall transfer level: Needs assistance Equipment used: Rolling walker (2 wheels) Transfers: Sit to/from Stand Sit to Stand: Min assist                Ambulation/Gait Ambulation/Gait assistance: Editor, commissioning (Feet): 3 Feet Assistive device: Rolling walker (2 wheels) Gait Pattern/deviations: Trunk flexed, Knees buckling, Step-through pattern, Decreased step length - right, Decreased step length - left, Shuffle Gait velocity: decreased     General Gait Details: uanble to walk further today due to fatigue and RLE pain   Stairs             Wheelchair Mobility     Tilt Bed Tilt Bed Patient Response: Cooperative  Modified Rankin (Stroke Patients Only)       Balance Overall balance assessment: Needs assistance Sitting-balance support: Bilateral upper extremity supported, Feet unsupported Sitting balance-Leahy Scale: Fair     Standing balance support: Bilateral upper extremity supported, During functional activity, Reliant on assistive device for balance Standing balance-Leahy Scale: Poor Standing balance comment: CGA for balance in standing with dependence on RW +! hands on at all times                            Cognition Arousal: Alert  Behavior During Therapy: WFL for tasks assessed/performed Overall Cognitive Status: Within Functional Limits for tasks assessed                                          Exercises      General Comments        Pertinent Vitals/Pain Pain Assessment Pain Assessment: Faces Faces Pain Scale: Hurts even more Pain Location: RLE pain Pain Descriptors / Indicators: Discomfort, Aching Pain Intervention(s): Limited activity within patient's tolerance,  Monitored during session, Repositioned    Home Living                          Prior Function            PT Goals (current goals can now be found in the care plan section) Progress towards PT goals: Not progressing toward goals - comment    Frequency    Min 1X/week      PT Plan      Co-evaluation              AM-PAC PT "6 Clicks" Mobility   Outcome Measure  Help needed turning from your back to your side while in a flat bed without using bedrails?: A Little Help needed moving from lying on your back to sitting on the side of a flat bed without using bedrails?: A Lot Help needed moving to and from a bed to a chair (including a wheelchair)?: A Lot Help needed standing up from a chair using your arms (e.g., wheelchair or bedside chair)?: A Little Help needed to walk in hospital room?: A Lot Help needed climbing 3-5 steps with a railing? : Total 6 Click Score: 13    End of Session Equipment Utilized During Treatment: Gait belt Activity Tolerance: Patient limited by pain;Patient limited by fatigue Patient left: in chair;with chair alarm set;with call bell/phone within reach Nurse Communication: Mobility status PT Visit Diagnosis: Unsteadiness on feet (R26.81);Repeated falls (R29.6);History of falling (Z91.81);Muscle weakness (generalized) (M62.81);Difficulty in walking, not elsewhere classified (R26.2)     Time: 6213-0865 PT Time Calculation (min) (ACUTE ONLY): 9 min  Charges:    $Therapeutic Activity: 8-22 mins PT General Charges $$ ACUTE PT VISIT: 1 Visit                   Danielle Dess, PTA 04/12/23, 10:38 AM

## 2023-04-12 NOTE — Plan of Care (Signed)
  Problem: Education: Goal: Knowledge of General Education information will improve Description: Including pain rating scale, medication(s)/side effects and non-pharmacologic comfort measures Outcome: Progressing   Problem: Clinical Measurements: Goal: Ability to maintain clinical measurements within normal limits will improve Outcome: Progressing Goal: Will remain free from infection Outcome: Progressing Goal: Respiratory complications will improve Outcome: Progressing Goal: Cardiovascular complication will be avoided Outcome: Progressing   Problem: Elimination: Goal: Will not experience complications related to urinary retention Outcome: Progressing   Problem: Pain Management: Goal: General experience of comfort will improve Outcome: Not Progressing

## 2023-04-12 NOTE — TOC Transition Note (Signed)
Transition of Care [facility]) - CM/SW Discharge Note   Patient Details  Name: Kristen Campbell MRN: [id number] Date of Birth: 07/11/1945  Transition of Care [facility]) CM/SW Contact:  Garret Reddish, RN Phone Number: 04/12/2023, 10:18 PM   Clinical Narrative:    Chart reviewed.  Noted that patient has orders for discharge today.    I have spoken with Jill Alexanders, Facilities manager at [facility] [address]  Jill Alexanders has informed me that the facility will transport patient back to the facility at 1 pm today.  Jill Alexanders has asked that the staff nurse call report to [phone].    I have faxed Jill Alexanders patient's Fl 2 and discharge summary.   I have made Coralee North with [facility] aware that patient will be returning back to [facility] [place] and PT/OT services will need to be resumed.    I have informed patient's Bernell List that patient will be returning to [facility] [place] today.    I have informed staff nurse of the above information.      Final next level of care: Skilled Nursing Facility Barriers to Discharge: No Barriers Identified   Patient Goals and CMS Choice CMS Medicare.gov Compare Post Acute Care list provided to:: Patient Choice offered to / list presented to : Patient  Discharge Placement                Patient chooses bed at:  (Patient will be returning to [facility]) Patient to be transferred to facility by: Facility Transport Name of family member notified: Bernell List Patient and family notified of of transfer: 04/12/23  Discharge Plan and Services Additional resources added to the After Visit Summary for       Post Acute Care Choice: Resumption of Svcs/PTA Provider                    HH Arranged: PT, OT [facility] Agency: [facility] Date [facility] Agency Contacted: 04/12/23   Representative spoke with at [facility] Agency: Coralee North  Social Determinants of Health (SDOH) Interventions SDOH Screenings   Food Insecurity: No Food Insecurity (04/07/2023)  Housing:  Low Risk  (04/07/2023)  Transportation Needs: No Transportation Needs (04/07/2023)  Utilities: Not At Risk (04/07/2023)  Depression (PHQ2-9): Medium Risk (04/15/2020)  Tobacco Use: Low Risk  (04/09/2023)     Readmission Risk Interventions     No data to display

## 2023-04-12 NOTE — NC FL2 (Signed)
[place] MEDICAID FL2 LEVEL OF CARE FORM     IDENTIFICATION  Patient Name: Kristen Campbell Birthdate: 1946/05/29 Sex: female Admission Date (Current Location): 04/07/2023  [facility] and [place] Number:  Chiropodist and Address:  [facility] [address], [place], [place] [address]      Provider Number: [id number]  Attending Physician Name and Address:  Lurene Shadow, MD  Relative Name and Phone Number:  Acquanetta Sit [phone]    Current Level of Care: Hospital Recommended Level of Care: Assisted Living Facility Prior Approval Number:    Date Approved/Denied:   PASRR Number:    Discharge Plan:  (Assisted Living)    Current Diagnoses: Patient Active Problem List   Diagnosis Date Noted   AKI (acute kidney injury) (HCC) 04/09/2023   Cerebrovascular disease    Acute on chronic kidney failure (HCC) 01/18/2020   Pneumococcal pneumonia (HCC) 01/18/2020   Bilateral leg edema 04/07/2019   Pyloric ulcer 03/12/2019   Essential hypertension 03/12/2019   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   Esophagitis 06/04/2017   Anemia 06/02/2017   History of CVA (cerebrovascular accident) 06/02/2017   Hyperlipidemia 06/02/2017   History of esophageal ulcer 08/10/2015   Screen for colon cancer 08/10/2015   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   Cerebral embolism with cerebral infarction (HCC) 12/12/2014   Caregiver with fatigue 09/09/2014   Adjustment disorder with depressed mood 09/09/2014   Chest pain 02/11/2013   Encephalopathy 02/11/2013    Orientation RESPIRATION BLADDER Height & Weight     Self, Time, Situation  Normal Incontinent Weight: 73.7 kg Height:  5' (152.4 cm)  BEHAVIORAL SYMPTOMS/MOOD NEUROLOGICAL BOWEL NUTRITION STATUS      Incontinent  (See Discharge Summary)  AMBULATORY STATUS COMMUNICATION OF NEEDS Skin   Extensive Assist Verbally Normal                       Personal Care Assistance Level of Assistance  Bathing, Feeding,  Dressing Bathing Assistance: Limited assistance Feeding assistance: Limited assistance Dressing Assistance: Limited assistance     Functional Limitations Info  Sight, Hearing, Speech Sight Info: Adequate Hearing Info: Adequate Speech Info: Adequate    SPECIAL CARE FACTORS FREQUENCY  PT (By licensed PT), OT (By licensed OT)     PT Frequency: 3x weeks OT Frequency: 3x weeks            Contractures Contractures Info: Not present    Additional Factors Info  Code Status, Allergies Code Status Info: Full Code Allergies Info: Carbamazepine, Dilantin (Phenytoin), Potassium Chloride, Ibuprofen, Codeine           Current Medications (04/12/2023):  This is the current hospital active medication list Current Facility-Administered Medications  Medication Dose Route Frequency Provider Last Rate Last Admin   acetaminophen (TYLENOL) tablet 650 mg  650 mg Oral Q6H PRN Lurene Shadow, MD   650 mg at 04/11/23 1530   amLODipine (NORVASC) tablet 5 mg  5 mg Oral Daily Enedina Finner, MD   5 mg at 04/12/23 0931   DULoxetine (CYMBALTA) DR capsule 60 mg  60 mg Oral Daily Enedina Finner, MD   60 mg at 04/12/23 0932   enoxaparin (LOVENOX) injection 30 mg  30 mg Subcutaneous Q24H Floydene Flock, MD   30 mg at 04/11/23 2152   hydrALAZINE (APRESOLINE) injection 10 mg  10 mg Intravenous Q6H PRN Enedina Finner, MD       pantoprazole (PROTONIX) EC tablet 40 mg  40 mg Oral Daily Enedina Finner,  MD   40 mg at 04/12/23 0931   pregabalin (LYRICA) capsule 50 mg  50 mg Oral TID Enedina Finner, MD   50 mg at 04/12/23 0931   QUEtiapine (SEROQUEL) tablet 12.5 mg  12.5 mg Oral BID Enedina Finner, MD   12.5 mg at 04/12/23 0931   traMADol (ULTRAM) tablet 50 mg  50 mg Oral Q6H PRN Enedina Finner, MD   50 mg at 04/11/23 2145     Discharge Medications: TAKE these medications     DULoxetine 60 MG capsule Commonly known as: CYMBALTA Take 60 mg by mouth daily.    furosemide 20 MG tablet Commonly known as: LASIX Take 1 tablet (20  mg total) by mouth daily. What changed: when to take this    loratadine 10 MG tablet Commonly known as: CLARITIN Take 10 mg by mouth daily as needed for allergies.    LORazepam 0.5 MG tablet Commonly known as: ATIVAN Take 0.5 mg by mouth every 8 (eight) hours as needed for anxiety.    omeprazole 20 MG capsule Commonly known as: PRILOSEC Take 20 mg by mouth daily.    potassium chloride 20 MEQ/15ML (10%) Soln Take 15 mLs by mouth 2 (two) times daily.    pregabalin 50 MG capsule Commonly known as: LYRICA Take 50 mg by mouth 3 (three) times daily.    QUEtiapine 25 MG tablet Commonly known as: SEROQUEL Take 12.5 mg by mouth 2 (two) times daily.    traMADol 50 MG tablet Commonly known as: ULTRAM Take 50 mg by mouth every 6 (six) hours as needed for moderate pain (pain score 4-6) or severe pain (pain score 7-10). Use first before trying dilaudid (hydromorphone)   Please see discharge summary for a list of discharge medications.  Relevant Imaging Results:  Relevant Lab Results:   Additional Information [id number]  Garret Reddish, RN

## 2023-06-09 ENCOUNTER — Other Ambulatory Visit: Payer: Self-pay

## 2023-06-09 ENCOUNTER — Emergency Department
Admission: EM | Admit: 2023-06-09 | Discharge: 2023-06-10 | Disposition: A | Discharge status: Home / Self Care | Payer: Medicare Other | Attending: Emergency Medicine | Admitting: Emergency Medicine

## 2023-06-09 ENCOUNTER — Emergency Department: Payer: Medicare Other

## 2023-06-09 DIAGNOSIS — R112 Nausea with vomiting, unspecified: Secondary | ICD-10-CM | POA: Insufficient documentation

## 2023-06-09 DIAGNOSIS — Z1152 Encounter for screening for COVID-19: Secondary | ICD-10-CM | POA: Insufficient documentation

## 2023-06-09 DIAGNOSIS — N390 Urinary tract infection, site not specified: Secondary | ICD-10-CM | POA: Insufficient documentation

## 2023-06-09 DIAGNOSIS — R1084 Generalized abdominal pain: Secondary | ICD-10-CM | POA: Diagnosis not present

## 2023-06-09 DIAGNOSIS — R197 Diarrhea, unspecified: Secondary | ICD-10-CM | POA: Insufficient documentation

## 2023-06-09 LAB — URINALYSIS, W/ REFLEX TO CULTURE (INFECTION SUSPECTED)
Bilirubin Urine: NEGATIVE
Glucose, UA: NEGATIVE mg/dL
Ketones, ur: NEGATIVE mg/dL
Nitrite: NEGATIVE
Protein, ur: NEGATIVE mg/dL
Specific Gravity, Urine: 1.016 (ref 1.005–1.030)
pH: 5 (ref 5.0–8.0)

## 2023-06-09 LAB — CBC WITH DIFFERENTIAL/PLATELET
Abs Immature Granulocytes: 0.03 10*3/uL (ref 0.00–0.07)
Basophils Absolute: 0 10*3/uL (ref 0.0–0.1)
Basophils Relative: 0 %
Eosinophils Absolute: 0 10*3/uL (ref 0.0–0.5)
Eosinophils Relative: 0 %
HCT: 34.5 % — ABNORMAL LOW (ref 36.0–46.0)
Hemoglobin: 11 g/dL — ABNORMAL LOW (ref 12.0–15.0)
Immature Granulocytes: 0 %
Lymphocytes Relative: 2 %
Lymphs Abs: 0.2 10*3/uL — ABNORMAL LOW (ref 0.7–4.0)
MCH: 28.7 pg (ref 26.0–34.0)
MCHC: 31.9 g/dL (ref 30.0–36.0)
MCV: 90.1 fL (ref 80.0–100.0)
Monocytes Absolute: 0.3 10*3/uL (ref 0.1–1.0)
Monocytes Relative: 3 %
Neutro Abs: 10 10*3/uL — ABNORMAL HIGH (ref 1.7–7.7)
Neutrophils Relative %: 95 %
Platelets: 191 10*3/uL (ref 150–400)
RBC: 3.83 MIL/uL — ABNORMAL LOW (ref 3.87–5.11)
RDW: 14.4 % (ref 11.5–15.5)
WBC: 10.6 10*3/uL — ABNORMAL HIGH (ref 4.0–10.5)
nRBC: 0 % (ref 0.0–0.2)

## 2023-06-09 LAB — COMPREHENSIVE METABOLIC PANEL
ALT: 15 U/L (ref 0–44)
AST: 26 U/L (ref 15–41)
Albumin: 4 g/dL (ref 3.5–5.0)
Alkaline Phosphatase: 169 U/L — ABNORMAL HIGH (ref 38–126)
Anion gap: 0 — ABNORMAL LOW (ref 5–15)
BUN: 44 mg/dL — ABNORMAL HIGH (ref 8–23)
CO2: 23 mmol/L (ref 22–32)
Calcium: 9.4 mg/dL (ref 8.9–10.3)
Chloride: 107 mmol/L (ref 98–111)
Creatinine, Ser: 1.78 mg/dL — ABNORMAL HIGH (ref 0.44–1.00)
GFR, Estimated: 29 mL/min — ABNORMAL LOW (ref >60)
Glucose, Bld: 138 mg/dL — ABNORMAL HIGH (ref 70–99)
Potassium: 4.3 mmol/L (ref 3.5–5.1)
Sodium: 130 mmol/L — ABNORMAL LOW (ref 135–145)
Total Bilirubin: 0.6 mg/dL (ref <1.2)
Total Protein: 7.8 g/dL (ref 6.5–8.1)

## 2023-06-09 LAB — LACTIC ACID, PLASMA
Lactic Acid, Venous: 2.2 mmol/L (ref 0.5–1.9)
Lactic Acid, Venous: 0.7 mmol/L (ref 0.5–1.9)

## 2023-06-09 LAB — RESP PANEL BY RT-PCR (RSV, FLU A&B, COVID)  RVPGX2
Influenza A by PCR: NEGATIVE
Influenza B by PCR: NEGATIVE
Resp Syncytial Virus by PCR: NEGATIVE
SARS Coronavirus 2 by RT PCR: NEGATIVE

## 2023-06-09 MED ORDER — ONDANSETRON HCL 4 MG/2ML IJ SOLN
4.0000 mg | Freq: Once | INTRAMUSCULAR | Status: AC
  Administered 2023-06-09: 4 mg via INTRAVENOUS
  Filled 2023-06-09: qty 2

## 2023-06-09 MED ORDER — SODIUM CHLORIDE 0.9 % IV BOLUS
1000.0000 mL | Freq: Once | INTRAVENOUS | Status: AC
  Administered 2023-06-09: 1000 mL via INTRAVENOUS

## 2023-06-09 MED ORDER — ACETAMINOPHEN 325 MG PO TABS
650.0000 mg | ORAL_TABLET | Freq: Once | ORAL | Status: AC
Start: 2023-06-09 — End: 2023-06-09
  Administered 2023-06-09: 650 mg via ORAL
  Filled 2023-06-09: qty 2

## 2023-06-09 MED ORDER — SODIUM CHLORIDE 0.9 % IV SOLN
1.0000 g | Freq: Once | INTRAVENOUS | Status: AC
  Administered 2023-06-09: 1 g via INTRAVENOUS
  Filled 2023-06-09: qty 10

## 2023-06-09 MED ORDER — CEPHALEXIN 500 MG PO CAPS: 500.0000 mg | ORAL_CAPSULE | Freq: Four times a day (QID) | ORAL | 0 refills | Status: AC

## 2023-06-09 MED ORDER — ONDANSETRON HCL 4 MG PO TABS: 4.0000 mg | ORAL_TABLET | Freq: Three times a day (TID) | ORAL | 0 refills | Status: AC | PRN

## 2023-06-09 NOTE — ED Triage Notes (Signed)
Arrives [facility] came from [facility] [place] pt has been sick all day N/V/D Unable to keep food/fluids down  Pt states she's been throwing up all day  EMSVitals: 98 orally, Oz 97%, BP:200/100, BG: None MedHx: Chronic pain, HTN

## 2023-06-09 NOTE — Discharge Instructions (Signed)
Please seek medical attention for any high fevers, chest pain, shortness of breath, change in behavior, persistent vomiting, bloody stool or any other new or concerning symptoms.  

## 2023-06-09 NOTE — ED Provider Notes (Signed)
[facility] Provider Note    Event Date/Time   First MD Initiated Contact with Patient 06/09/23 1831     (approximate)   History   Emesis   HPI  Kristen Campbell is a 77 y.o. female who presents to the emergency department today because of concerns for nausea vomiting.  Patient states symptoms started around 6 AM this morning.  She had felt fine when she went to bed last night.  The vomiting has been fairly constant throughout the day.  This has been accompanied by generalized abdominal pain.  She has also had some diarrhea.  Patient denies any known sick contacts.     Physical Exam   Triage Vital Signs: ED Triage Vitals  Encounter Vitals Group     BP 06/09/23 1837 139/64     Systolic BP Percentile --      Diastolic BP Percentile --      Pulse Rate 06/09/23 1837 (!) 110     Resp 06/09/23 1837 15     Temp 06/09/23 1837 (!) 101.1 F (38.4 C)     Temp Source 06/09/23 1837 Oral     SpO2 06/09/23 1837 100 %     Weight 06/09/23 1839 162 lb 7.7 oz (73.7 kg)     Height 06/09/23 1839 5' (1.524 m)     Head Circumference --      Peak Flow --      Pain Score 06/09/23 1838 0     Pain Loc --      Pain Education --      Exclude from Growth Chart --     Most recent vital signs: Vitals:   06/09/23 1837  BP: 139/64  Pulse: (!) 110  Resp: 15  Temp: (!) 101.1 F (38.4 C)  SpO2: 100%   General: Awake, alert, oriented. CV:  Good peripheral perfusion. Tachycardia. Resp:  Normal effort. Lungs clear. Abd:  No distention. Minimally tender to palpation.   ED Results / Procedures / Treatments   Labs (all labs ordered are listed, but only abnormal results are displayed) Labs Reviewed  LACTIC ACID, PLASMA - Abnormal; Notable for the following components:      Result Value   Lactic Acid, Venous 2.2 (*)    All other components within normal limits  COMPREHENSIVE METABOLIC PANEL - Abnormal; Notable for the following components:   Sodium 130 (*)     Glucose, Bld 138 (*)    BUN 44 (*)    Creatinine, Ser 1.78 (*)    Alkaline Phosphatase 169 (*)    GFR, Estimated 29 (*)    Anion gap 0 (*)    All other components within normal limits  CBC WITH DIFFERENTIAL/PLATELET - Abnormal; Notable for the following components:   WBC 10.6 (*)    RBC 3.83 (*)    Hemoglobin 11.0 (*)    HCT 34.5 (*)    Neutro Abs 10.0 (*)    Lymphs Abs 0.2 (*)    All other components within normal limits  URINALYSIS, W/ REFLEX TO CULTURE (INFECTION SUSPECTED) - Abnormal; Notable for the following components:   Color, Urine YELLOW (*)    APPearance HAZY (*)    Hgb urine dipstick MODERATE (*)    Leukocytes,Ua LARGE (*)    Bacteria, UA RARE (*)    All other components within normal limits  RESP PANEL BY RT-PCR (RSV, FLU A&B, COVID)  [id number]  CULTURE, BLOOD (ROUTINE X 2)  CULTURE, BLOOD (ROUTINE X 2)  URINE  CULTURE  LACTIC ACID, PLASMA     EKG  I, Phineas Semen, attending physician, personally viewed and interpreted this EKG  EKG Time: 1842 Rate: 107 Rhythm: sinus tachycardia Axis: normal Intervals: qtc 470 QRS: narrow, q waves v1, v2 ST changes: no st elevation Impression: abnormal ekg   RADIOLOGY I independently interpreted and visualized the CXR. My interpretation: No pneumonia Radiology interpretation:  IMPRESSION:  No active disease.    I independently interpreted and visualized the CT abd pel. My interpretation: No free air Radiology interpretation:  IMPRESSION:  1. No acute abdominopelvic findings.  2. Small volume gas within the endometrial canal, which is  nonspecific and may be iatrogenic if there has been recent  instrumentation.  3. Small hiatal hernia.  4.  Aortic Atherosclerosis (ICD10-I70.0).     PROCEDURES:  Critical Care performed: No    MEDICATIONS ORDERED IN ED: Medications  acetaminophen (TYLENOL) tablet 650 mg (has no administration in time range)     IMPRESSION / MDM / ASSESSMENT AND PLAN / ED COURSE  I  reviewed the triage vital signs and the nursing notes.                              Differential diagnosis includes, but is not limited to, gastroenteritis, SBO, pancreatitis  Patient's presentation is most consistent with acute presentation with potential threat to life or bodily function.   The patient is on the cardiac monitor to evaluate for evidence of arrhythmia and/or significant heart rate changes.  Patient presented to the emergency department today because of concerns for nausea vomiting with some diarrhea.  On exam patient is tachycardic.  Is actively vomiting.  Does have some minimal diffuse tenderness to palpation of the abdomen.  Will give fluids antiemetics and check blood work.  Patient did feel better after IV fluids and antiemetics.  Blood work did show an initial slight lactic acidosis however this did improve after fluids.  Blood work without concerning leukocytosis.  UA did have signs for possible infection.  Did give IV antibiotics and will plan on discharging with further antibiotics.  I do wonder if patient is suffering mostly from viral gastroenteritis more so than symptoms secondary to the UTI.  Will plan on discharging with antiemetics as well.      FINAL CLINICAL IMPRESSION(S) / ED DIAGNOSES   Final diagnoses:  Nausea and vomiting, unspecified vomiting type  Lower urinary tract infectious disease     Note:  This document was prepared using Dragon voice recognition software and may include unintentional dictation errors.    Phineas Semen, MD 06/09/23 [phone]

## 2023-06-12 LAB — URINE CULTURE: Culture: 80000 — AB

## 2023-06-13 ENCOUNTER — Telehealth: Payer: Self-pay | Admitting: Emergency Medicine

## 2023-06-13 ENCOUNTER — Other Ambulatory Visit: Payer: Self-pay

## 2023-06-13 ENCOUNTER — Emergency Department
Admission: EM | Admit: 2023-06-13 | Discharge: 2023-06-13 | Disposition: A | Discharge status: Home / Self Care | Payer: Medicare Other | Attending: Student in an Organized Health Care Education/Training Program | Admitting: Student in an Organized Health Care Education/Training Program

## 2023-06-13 ENCOUNTER — Encounter: Payer: Self-pay | Admitting: Emergency Medicine

## 2023-06-13 DIAGNOSIS — I130 Hypertensive heart and chronic kidney disease with heart failure and stage 1 through stage 4 chronic kidney disease, or unspecified chronic kidney disease: Secondary | ICD-10-CM | POA: Diagnosis not present

## 2023-06-13 DIAGNOSIS — N189 Chronic kidney disease, unspecified: Secondary | ICD-10-CM | POA: Diagnosis not present

## 2023-06-13 DIAGNOSIS — M545 Low back pain, unspecified: Secondary | ICD-10-CM | POA: Insufficient documentation

## 2023-06-13 DIAGNOSIS — F0390 Unspecified dementia without behavioral disturbance: Secondary | ICD-10-CM | POA: Diagnosis not present

## 2023-06-13 DIAGNOSIS — I509 Heart failure, unspecified: Secondary | ICD-10-CM | POA: Diagnosis not present

## 2023-06-13 DIAGNOSIS — R799 Abnormal finding of blood chemistry, unspecified: Secondary | ICD-10-CM | POA: Insufficient documentation

## 2023-06-13 DIAGNOSIS — R899 Unspecified abnormal finding in specimens from other organs, systems and tissues: Secondary | ICD-10-CM

## 2023-06-13 DIAGNOSIS — R109 Unspecified abdominal pain: Secondary | ICD-10-CM | POA: Insufficient documentation

## 2023-06-13 LAB — BLOOD CULTURE ID PANEL (REFLEXED) - BCID2

## 2023-06-13 NOTE — ED Notes (Signed)
PA in triage for [facility]SE.

## 2023-06-13 NOTE — ED Triage Notes (Signed)
First Nurse Note: BIB [facility] from [facility]. Sent for abnormal labs. Pt reportedly got blood drawn 2 wks ago after a visit to [facility] and was advised to come back for "e.coli" in blood. Pt alert and oriented.   EMS VS:  166/110 manual with EMS HR 72 96% RA RR 18

## 2023-06-13 NOTE — Telephone Encounter (Signed)
This RN called and spoke with daughter, Darl Pikes, informed that patient had a positive blood culture and that she needs to come back in for re-evaluation and possible admission.   Darl Pikes states that she will call the [facility] and have them call EMS to transport her back to the ED.

## 2023-06-13 NOTE — ED Provider Notes (Signed)
[facility] Provider Note    Event Date/Time   First MD Initiated Contact with Patient 06/13/23 2246     (approximate)   History   Abdominal Pain and Abnormal Lab   HPI  Perle Avallone is a 77 y.o. female PMH of heart failure, hypertension, vascular dementia, CKD, depression and chronic back pain presents to the ED for abnormal labs.  Patient was seen by this ED on 12/25, diagnosed with a UTI and treated with Keflex.  At that time she had a urine culture and blood cultures drawn.  The hospital reached out to her this evening asking her to come back to be seen for positive blood cultures.  Patient reports some suprapubic abdominal pain as well as back pain.  She has been taking her antibiotic as prescribed.  She feels like her symptoms are improving.      Physical Exam   Triage Vital Signs: ED Triage Vitals [06/13/23 1958]  Encounter Vitals Group     BP 131/79     Systolic BP Percentile      Diastolic BP Percentile      Pulse Rate 85     Resp 14     Temp 98 F (36.7 C)     Temp Source Oral     SpO2 99 %     Weight 160 lb 15 oz (73 kg)     Height 5' (1.524 m)     Head Circumference      Peak Flow      Pain Score 8     Pain Loc      Pain Education      Exclude from Growth Chart     Most recent vital signs: Vitals:   06/13/23 1958  BP: 131/79  Pulse: 85  Resp: 14  Temp: 98 F (36.7 C)  SpO2: 99%   General: Awake, no distress.  CV:  Good peripheral perfusion.  Resp:  Normal effort.  Abd:  No distention.  Other:     ED Results / Procedures / Treatments   Labs (all labs ordered are listed, but only abnormal results are displayed) Labs Reviewed - No data to display   PROCEDURES:  Critical Care performed: No  Procedures   MEDICATIONS ORDERED IN ED: Medications - No data to display   IMPRESSION / MDM / ASSESSMENT AND PLAN / ED COURSE  I reviewed the triage vital signs and the nursing notes.                              77 year old female presents to the ED as she was instructed to return for positive blood cultures.  Vital signs are stable patient NAD on exam.  Differential diagnosis includes, but is not limited to, sepsis, UTI, pyelonephritis.  Patient's presentation is most consistent with acute, uncomplicated illness.  I reviewed patient's chart extensively in regards to the positive blood cultures.  During her visit on 12/25 she had a urine culture that grew E. coli.  The positive blood culture that patient was instructed to return for was growth of gram-positive cocci in the aerobic bottle only, drawn from patient's right shoulder. I spoke with the lab in regards to patient's results as it seemed unclear based on conversations I had with the nursing staff if there was E. coli in the patient's blood.  Since E. coli is a gram-negative rod and the blood culture grew gram-positive cocci this is  not consistent with E. coli in the blood.  I also spoke with my attending physician, Dr. Roxan Hockey and reviewed these results with him, he is in agreement with me that the gram-positive cocci is likely contaminant given that it only grew in one of the blood culture tubes.  Patient was instructed to continue taking her antibiotic as the E. coli in the urine is susceptible to cephalosporins.  I do not have any concerns that patient is septic at this time as she is afebrile and hemodynamically stable.  She voiced understanding, all questions were answered and she was stable at discharge.     FINAL CLINICAL IMPRESSION(S) / ED DIAGNOSES   Final diagnoses:  Abnormal laboratory test     Rx / DC Orders   ED Discharge Orders     None        Note:  This document was prepared using Dragon voice recognition software and may include unintentional dictation errors.   Cameron Ali, PA-C 06/13/23 2301    Willy Eddy, MD 06/13/23 [phone]

## 2023-06-13 NOTE — Discharge Instructions (Signed)
The urine culture showed that you have E Coli in your urine, it is sensitive to the antibiotic that you are currently taking.   Please continue to take the antibiotics as prescribed. Make sure you complete the full course.   If you do not feel any better after taking all of the antibiotic please return to the ED or be seen by your primary care provider.

## 2023-06-13 NOTE — ED Triage Notes (Signed)
Pt to ED via EMS from [facility] [place] c/o abnormal lab.  Per EMS pt was called d/t e.coli in urine.  Pt with some abd cramping, burning with urination and back pain.  Pt A&Ox4, chest rise even and unlabored, skin WNL and in NAD at this time.

## 2023-06-13 NOTE — ED Provider Triage Note (Addendum)
Emergency Medicine Provider Triage Evaluation Note  Letetia Towe , a 77 y.o. female  was evaluated in triage.  Pt complains of being called back to the hospital as her urine culture grew e coli. Was instructed to come in for re-evaluation. She was seen on 12/25 and treated for a UTI, discharged with keflex and zofran. Urine culture and sensitivity that I reviewed shows e coli growth with sensitivity to cephalosporins.  Review of Systems  Positive: Lower abdominal pain, dysuria Negative: fever  Physical Exam  There were no vitals taken for this visit. Gen:   Awake, no distress   Resp:  Normal effort MSK:   Moves extremities without difficulty  Other:    Medical Decision Making  Medically screening exam initiated at 7:55 PM.  Appropriate orders placed.  Sharonlee Meisel was informed that the remainder of the evaluation will be completed by another provider, this initial triage assessment does not replace that evaluation, and the importance of remaining in the ED until their evaluation is complete.     Cameron Ali, PA-C 06/13/23 1957    Cameron Ali, PA-C 06/13/23 2003

## 2023-06-14 LAB — CULTURE, BLOOD (ROUTINE X 2)
Culture: NO GROWTH
Special Requests: ADEQUATE

## 2023-06-16 LAB — CULTURE, BLOOD (ROUTINE X 2): Culture  Setup Time: NO GROWTH

## 2023-07-14 ENCOUNTER — Emergency Department: Payer: Medicare Other

## 2023-07-14 ENCOUNTER — Encounter: Payer: Self-pay | Admitting: *Deleted

## 2023-07-14 ENCOUNTER — Inpatient Hospital Stay
Admission: EM | Admit: 2023-07-14 | Discharge: 2023-07-21 | DRG: 193 | Disposition: A | Discharge status: Home Health | Payer: Medicare Other | Attending: Internal Medicine | Admitting: Internal Medicine

## 2023-07-14 ENCOUNTER — Other Ambulatory Visit: Payer: Self-pay

## 2023-07-14 DIAGNOSIS — D631 Anemia in chronic kidney disease: Secondary | ICD-10-CM | POA: Diagnosis present

## 2023-07-14 DIAGNOSIS — G309 Alzheimer's disease, unspecified: Secondary | ICD-10-CM | POA: Diagnosis present

## 2023-07-14 DIAGNOSIS — G919 Hydrocephalus, unspecified: Secondary | ICD-10-CM | POA: Diagnosis present

## 2023-07-14 DIAGNOSIS — J101 Influenza due to other identified influenza virus with other respiratory manifestations: Secondary | ICD-10-CM | POA: Diagnosis not present

## 2023-07-14 DIAGNOSIS — Z8673 Personal history of transient ischemic attack (TIA), and cerebral infarction without residual deficits: Secondary | ICD-10-CM

## 2023-07-14 DIAGNOSIS — J9601 Acute respiratory failure with hypoxia: Secondary | ICD-10-CM | POA: Diagnosis present

## 2023-07-14 DIAGNOSIS — E860 Dehydration: Secondary | ICD-10-CM | POA: Diagnosis present

## 2023-07-14 DIAGNOSIS — F0280 Dementia in other diseases classified elsewhere without behavioral disturbance: Secondary | ICD-10-CM | POA: Diagnosis present

## 2023-07-14 DIAGNOSIS — Z7401 Bed confinement status: Secondary | ICD-10-CM

## 2023-07-14 DIAGNOSIS — Z1152 Encounter for screening for COVID-19: Secondary | ICD-10-CM

## 2023-07-14 DIAGNOSIS — F0150 Vascular dementia without behavioral disturbance: Secondary | ICD-10-CM | POA: Diagnosis present

## 2023-07-14 DIAGNOSIS — Z888 Allergy status to other drugs, medicaments and biological substances status: Secondary | ICD-10-CM

## 2023-07-14 DIAGNOSIS — Z8719 Personal history of other diseases of the digestive system: Secondary | ICD-10-CM | POA: Diagnosis not present

## 2023-07-14 DIAGNOSIS — J111 Influenza due to unidentified influenza virus with other respiratory manifestations: Principal | ICD-10-CM

## 2023-07-14 DIAGNOSIS — I129 Hypertensive chronic kidney disease with stage 1 through stage 4 chronic kidney disease, or unspecified chronic kidney disease: Secondary | ICD-10-CM | POA: Diagnosis present

## 2023-07-14 DIAGNOSIS — Z885 Allergy status to narcotic agent status: Secondary | ICD-10-CM | POA: Diagnosis not present

## 2023-07-14 DIAGNOSIS — I2510 Atherosclerotic heart disease of native coronary artery without angina pectoris: Secondary | ICD-10-CM | POA: Diagnosis present

## 2023-07-14 DIAGNOSIS — I679 Cerebrovascular disease, unspecified: Secondary | ICD-10-CM | POA: Diagnosis present

## 2023-07-14 DIAGNOSIS — I10 Essential (primary) hypertension: Secondary | ICD-10-CM | POA: Diagnosis not present

## 2023-07-14 DIAGNOSIS — Z79899 Other long term (current) drug therapy: Secondary | ICD-10-CM | POA: Diagnosis not present

## 2023-07-14 DIAGNOSIS — Z886 Allergy status to analgesic agent status: Secondary | ICD-10-CM

## 2023-07-14 DIAGNOSIS — R0902 Hypoxemia: Secondary | ICD-10-CM

## 2023-07-14 DIAGNOSIS — D649 Anemia, unspecified: Secondary | ICD-10-CM | POA: Diagnosis not present

## 2023-07-14 DIAGNOSIS — N1832 Chronic kidney disease, stage 3b: Secondary | ICD-10-CM | POA: Diagnosis present

## 2023-07-14 DIAGNOSIS — Z982 Presence of cerebrospinal fluid drainage device: Secondary | ICD-10-CM

## 2023-07-14 DIAGNOSIS — E8721 Acute metabolic acidosis: Secondary | ICD-10-CM | POA: Diagnosis present

## 2023-07-14 DIAGNOSIS — N179 Acute kidney failure, unspecified: Secondary | ICD-10-CM | POA: Diagnosis present

## 2023-07-14 LAB — BASIC METABOLIC PANEL
Anion gap: 12 (ref 5–15)
BUN: 50 mg/dL — ABNORMAL HIGH (ref 8–23)
CO2: 19 mmol/L — ABNORMAL LOW (ref 22–32)
Calcium: 8.8 mg/dL — ABNORMAL LOW (ref 8.9–10.3)
Chloride: 105 mmol/L (ref 98–111)
Creatinine, Ser: 2.75 mg/dL — ABNORMAL HIGH (ref 0.44–1.00)
GFR, Estimated: 17 mL/min — ABNORMAL LOW (ref >60)
Glucose, Bld: 123 mg/dL — ABNORMAL HIGH (ref 70–99)
Potassium: 4.9 mmol/L (ref 3.5–5.1)
Sodium: 136 mmol/L (ref 135–145)

## 2023-07-14 LAB — CBC
HCT: 42.6 % (ref 36.0–46.0)
Hemoglobin: 12.7 g/dL (ref 12.0–15.0)
MCH: 28.1 pg (ref 26.0–34.0)
MCHC: 29.8 g/dL — ABNORMAL LOW (ref 30.0–36.0)
MCV: 94.2 fL (ref 80.0–100.0)
Platelets: 185 10*3/uL (ref 150–400)
RBC: 4.52 MIL/uL (ref 3.87–5.11)
RDW: 15.1 % (ref 11.5–15.5)
WBC: 4.2 10*3/uL (ref 4.0–10.5)
nRBC: 0 % (ref 0.0–0.2)

## 2023-07-14 LAB — TROPONIN I (HIGH SENSITIVITY)
Troponin I (High Sensitivity): 26 ng/L — ABNORMAL HIGH (ref <18)
Troponin I (High Sensitivity): 23 ng/L — ABNORMAL HIGH (ref <18)

## 2023-07-14 LAB — RESP PANEL BY RT-PCR (RSV, FLU A&B, COVID)  RVPGX2
Influenza A by PCR: POSITIVE — AB
Influenza B by PCR: NEGATIVE
Resp Syncytial Virus by PCR: NEGATIVE
SARS Coronavirus 2 by RT PCR: NEGATIVE

## 2023-07-14 LAB — BRAIN NATRIURETIC PEPTIDE: B Natriuretic Peptide: 32.8 pg/mL (ref 0.0–100.0)

## 2023-07-14 MED ORDER — SODIUM CHLORIDE 0.9 % IV BOLUS
1000.0000 mL | Freq: Once | INTRAVENOUS | Status: AC
  Administered 2023-07-14: 1000 mL via INTRAVENOUS

## 2023-07-14 MED ORDER — QUETIAPINE FUMARATE 25 MG PO TABS
12.5000 mg | ORAL_TABLET | Freq: Two times a day (BID) | ORAL | Status: DC
  Administered 2023-07-14 – 2023-07-21 (×14): 12.5 mg via ORAL
  Filled 2023-07-14 (×14): qty 1

## 2023-07-14 MED ORDER — ACETAMINOPHEN 650 MG RE SUPP
650.0000 mg | Freq: Four times a day (QID) | RECTAL | Status: DC | PRN
Start: 2023-07-14 — End: 2023-07-21

## 2023-07-14 MED ORDER — LORAZEPAM 0.5 MG PO TABS
0.5000 mg | ORAL_TABLET | Freq: Three times a day (TID) | ORAL | Status: DC | PRN
  Administered 2023-07-18 – 2023-07-20 (×3): 0.5 mg via ORAL
  Filled 2023-07-14 (×3): qty 1

## 2023-07-14 MED ORDER — ONDANSETRON HCL 4 MG/2ML IJ SOLN
4.0000 mg | Freq: Four times a day (QID) | INTRAMUSCULAR | Status: DC | PRN
  Administered 2023-07-16: 4 mg via INTRAVENOUS
  Filled 2023-07-14: qty 2

## 2023-07-14 MED ORDER — IPRATROPIUM-ALBUTEROL 0.5-2.5 (3) MG/3ML IN SOLN: 3.0000 mL | Freq: Once | RESPIRATORY_TRACT | Status: DC

## 2023-07-14 MED ORDER — PANTOPRAZOLE SODIUM 40 MG PO TBEC
40.0000 mg | DELAYED_RELEASE_TABLET | Freq: Every day | ORAL | Status: DC
  Administered 2023-07-15 – 2023-07-21 (×7): 40 mg via ORAL
  Filled 2023-07-14 (×7): qty 1

## 2023-07-14 MED ORDER — PREGABALIN 50 MG PO CAPS
50.0000 mg | ORAL_CAPSULE | Freq: Three times a day (TID) | ORAL | Status: DC
  Administered 2023-07-14 – 2023-07-21 (×20): 50 mg via ORAL
  Filled 2023-07-14 (×20): qty 1

## 2023-07-14 MED ORDER — IPRATROPIUM-ALBUTEROL 0.5-2.5 (3) MG/3ML IN SOLN
9.0000 mL | Freq: Once | RESPIRATORY_TRACT | Status: AC
  Administered 2023-07-14: 9 mL via RESPIRATORY_TRACT
  Filled 2023-07-14: qty 9

## 2023-07-14 MED ORDER — TRAMADOL HCL 50 MG PO TABS
50.0000 mg | ORAL_TABLET | Freq: Four times a day (QID) | ORAL | Status: DC | PRN
  Administered 2023-07-15 – 2023-07-21 (×7): 50 mg via ORAL
  Filled 2023-07-14 (×7): qty 1

## 2023-07-14 MED ORDER — ACETAMINOPHEN 325 MG PO TABS
650.0000 mg | ORAL_TABLET | Freq: Four times a day (QID) | ORAL | Status: DC | PRN
  Administered 2023-07-14 – 2023-07-20 (×4): 650 mg via ORAL
  Filled 2023-07-14 (×5): qty 2

## 2023-07-14 MED ORDER — ONDANSETRON HCL 4 MG PO TABS: 4.0000 mg | ORAL_TABLET | Freq: Four times a day (QID) | ORAL | Status: DC | PRN

## 2023-07-14 MED ORDER — ENOXAPARIN SODIUM 40 MG/0.4ML IJ SOSY
40.0000 mg | PREFILLED_SYRINGE | INTRAMUSCULAR | Status: DC
  Administered 2023-07-15 – 2023-07-16 (×2): 40 mg via SUBCUTANEOUS
  Filled 2023-07-14 (×2): qty 0.4

## 2023-07-14 NOTE — Assessment & Plan Note (Addendum)
07-17-2023 stable. Monitor for reflux symptoms 07-19-2023 stable. 07-20-2023 stable.

## 2023-07-14 NOTE — ED Provider Triage Note (Signed)
Emergency Medicine Provider Triage Evaluation Note  Kristen Campbell , a 78 y.o. female  was evaluated in triage.  Pt complains of cough, shortness of breath, had low O2 on room air at the [place] [facility], also generalized weakness for several days.  Review of Systems  Positive:  Negative:   Physical Exam  There were no vitals taken for this visit. Gen:   Awake, no distress   Resp:  Normal effort  MSK:   Moves extremities without difficulty  Other:    Medical Decision Making  Medically screening exam initiated at 3:05 PM.  Appropriate orders placed.  Sheree Feely was informed that the remainder of the evaluation will be completed by another provider, this initial triage assessment does not replace that evaluation, and the importance of remaining in the ED until their evaluation is complete.     Faythe Ghee, PA-C 07/14/23 1505

## 2023-07-14 NOTE — H&P (Incomplete)
History and Physical    Patient: Kristen Campbell QMV:[id number] DOB: Sep 22, 1945 DOA: 07/14/2023 DOS: the patient was seen and examined on 07/14/2023 PCP: Emi Belfast, FNP  Patient coming from: ALF/ILF  Chief Complaint:  Chief Complaint  Patient presents with   Cough    HPI: Kristen Campbell is a 78 y.o. female with medical history significant for CVA, brain aneurysm s/p VP shunt, CKD 3B, dementia and hypertension, with last hospitalization in October 2024 for altered mental status rule out for CVA with MRI, who presents to the ED with a 3-day history of cough, shortness of breath, weakness and decreased oral intake, with O2 sat 91% with EMS and no history of prior O2 requirement.  She denied chest pain. ED course and data review: Vitals within normal limits Workup notable for influenza A+ with normal WBC and negative CXR Troponin 23 and BNP 32.8 BMP notable for creatinine of 2, up from baseline of 1.78 for her CKD with bicarb of 19 EKG, personally viewed and interpreted showing NSR at 86 with no concerning ischemic ST-T wave changes Chest x-ray with no active cardiopulmonary disease Patient treated with a DuoNeb and given an NS 1 L bolus Hospitalist consulted for admission for influenza A with acute respiratory failure.  Tamiflu not started as patient on the outside 72-hour window.   Past Medical History:  Diagnosis Date   Atherosclerotic heart disease    Brain aneurysm    Cerebrovascular disease    Chronic kidney disease    Depression    Heart failure (HCC)    Hypertension    Vascular dementia (HCC)    Past Surgical History:  Procedure Laterality Date   ABDOMINAL SURGERY     fluid tumor removal   ABDOMINAL SURGERY     bleeding ulcers   BALLOON DILATION N/A 03/12/2019   Procedure: BALLOON DILATION;  Surgeon: Kerin Salen, MD;  Location: [facility] ENDOSCOPY;  Service: Gastroenterology;  Laterality: N/A;   BIOPSY  03/12/2019   Procedure: BIOPSY;  Surgeon: Kerin Salen, MD;   Location: [facility] ENDOSCOPY;  Service: Gastroenterology;;   CEREBRAL ANEURYSM REPAIR     CSF SHUNT     ESOPHAGOGASTRODUODENOSCOPY (EGD) WITH PROPOFOL N/A 03/12/2019   Procedure: ESOPHAGOGASTRODUODENOSCOPY (EGD) WITH PROPOFOL;  Surgeon: Kerin Salen, MD;  Location: [facility] ENDOSCOPY;  Service: Gastroenterology;  Laterality: N/A;   ROTATOR CUFF REPAIR     Social History:  reports that she has never smoked. She has never used smokeless tobacco. She reports that she does not drink alcohol and does not use drugs.  Allergies  Allergen Reactions   Carbamazepine Other (See Comments)    Hallucinations/ reaction to Tegretol    Dilantin [Phenytoin] Other (See Comments)    MD said never to take it - unknown reaction   Potassium Chloride Itching and Other (See Comments)    Reaction to KlorCon   Ibuprofen Itching    S/w daughter on 04/07/23 and confirmed that she does not have an allergy to ibuprofen, she states that her MD does not want her to take ibuprofen due to ulcers but okay to take tylenol    Codeine Nausea And Vomiting    History reviewed. No pertinent family history.  Prior to Admission medications   Medication Sig Start Date End Date Taking? Authorizing Provider  DULoxetine (CYMBALTA) 60 MG capsule Take 60 mg by mouth daily.    [provider]  furosemide (LASIX) 20 MG tablet Take 1 tablet (20 mg total) by mouth daily. 04/12/23   Lurene Shadow, MD  loratadine (CLARITIN) 10 MG tablet Take 10 mg by mouth daily as needed for allergies.    [provider]  LORazepam (ATIVAN) 0.5 MG tablet Take 0.5 mg by mouth every 8 (eight) hours as needed for anxiety. 04/06/23   [provider]  omeprazole (PRILOSEC) 20 MG capsule Take 20 mg by mouth daily. 03/24/23   [provider]  ondansetron (ZOFRAN) 4 MG tablet Take 1 tablet (4 mg total) by mouth every 8 (eight) hours as needed. 06/09/23   Phineas Semen, MD  potassium chloride 20 MEQ/15ML (10%) SOLN Take 15 mLs by mouth 2  (two) times daily. 04/01/23   [provider]  pregabalin (LYRICA) 50 MG capsule Take 50 mg by mouth 3 (three) times daily. 03/22/23   [provider]  QUEtiapine (SEROQUEL) 25 MG tablet Take 12.5 mg by mouth 2 (two) times daily. 03/24/23   [provider]  traMADol (ULTRAM) 50 MG tablet Take 50 mg by mouth every 6 (six) hours as needed for moderate pain (pain score 4-6) or severe pain (pain score 7-10). Use first before trying dilaudid (hydromorphone) 03/22/23   [provider]    Physical Exam: Vitals:   07/14/23 1503 07/14/23 1825  BP: 116/74 (!) 148/80  Pulse: 92 87  Resp: 18 20  Temp: 98.4 F (36.9 C) 98.5 F (36.9 C)  TempSrc: Oral Oral  SpO2: 97% 96%   Physical Exam Vitals and nursing note reviewed.  Constitutional:      General: She is not in acute distress. HENT:     Head: Normocephalic and atraumatic.  Cardiovascular:     Rate and Rhythm: Normal rate and regular rhythm.     Heart sounds: Normal heart sounds.  Pulmonary:     Effort: Pulmonary effort is normal.     Breath sounds: Normal breath sounds.  Abdominal:     Palpations: Abdomen is soft.     Tenderness: There is no abdominal tenderness.  Neurological:     Mental Status: Mental status is at baseline.     Labs on Admission: I have personally reviewed following labs and imaging studies  CBC: Recent Labs  Lab 07/14/23 1517  WBC 4.2  HGB 12.7  HCT 42.6  MCV 94.2  PLT 185   Basic Metabolic Panel: Recent Labs  Lab 07/14/23 1517  NA 136  K 4.9  CL 105  CO2 19*  GLUCOSE 123*  BUN 50*  CREATININE 2.75*  CALCIUM 8.8*   GFR: CrCl cannot be calculated (Unknown ideal weight.). Liver Function Tests: No results for input(s): "AST", "ALT", "ALKPHOS", "BILITOT", "PROT", "ALBUMIN" in the last 168 hours. No results for input(s): "LIPASE", "AMYLASE" in the last 168 hours. No results for input(s): "AMMONIA" in the last 168 hours. Coagulation Profile: No results for  input(s): "INR", "PROTIME" in the last 168 hours. Cardiac Enzymes: No results for input(s): "CKTOTAL", "CKMB", "CKMBINDEX", "TROPONINI" in the last 168 hours. BNP (last 3 results) No results for input(s): "PROBNP" in the last 8760 hours. HbA1C: No results for input(s): "HGBA1C" in the last 72 hours. CBG: No results for input(s): "GLUCAP" in the last 168 hours. Lipid Profile: No results for input(s): "CHOL", "HDL", "LDLCALC", "TRIG", "CHOLHDL", "LDLDIRECT" in the last 72 hours. Thyroid Function Tests: No results for input(s): "TSH", "T4TOTAL", "FREET4", "T3FREE", "THYROIDAB" in the last 72 hours. Anemia Panel: No results for input(s): "VITAMINB12", "FOLATE", "FERRITIN", "TIBC", "IRON", "RETICCTPCT" in the last 72 hours. Urine analysis:    Component Value Date/Time   COLORURINE YELLOW (A) 06/09/2023  2256   APPEARANCEUR HAZY (A) 06/09/2023 2256   LABSPEC 1.016 06/09/2023 2256   PHURINE 5.0 06/09/2023 2256   GLUCOSEU NEGATIVE 06/09/2023 2256   HGBUR MODERATE (A) 06/09/2023 2256   BILIRUBINUR NEGATIVE 06/09/2023 2256   KETONESUR NEGATIVE 06/09/2023 2256   PROTEINUR NEGATIVE 06/09/2023 2256   UROBILINOGEN 1.0 09/08/2014 1515   NITRITE NEGATIVE 06/09/2023 2256   LEUKOCYTESUR LARGE (A) 06/09/2023 2256    Radiological Exams on Admission: DG Chest 2 View Result Date: 07/14/2023 CLINICAL DATA:  Cough for 2 days. EXAM: CHEST - 2 VIEW COMPARISON:  June 09, 2023. FINDINGS: The heart size and mediastinal contours are within normal limits. Both lungs are clear. The visualized skeletal structures are unremarkable. IMPRESSION: No active cardiopulmonary disease. Electronically Signed   By: Lupita Raider M.D.   On: 07/14/2023 16:36     Data Reviewed: Relevant notes from primary care and specialist visits, past discharge summaries as available in EHR, including Care Everywhere. Prior diagnostic testing as pertinent to current admission diagnoses Updated medications and problem lists for  reconciliation ED course, including vitals, labs, imaging, treatment and response to treatment Triage notes, nursing and pharmacy notes and ED provider's notes Notable results as noted in HPI   Assessment and Plan: * Influenza A Acute respiratory failure with hypoxia Supplemental O2 to keep sats over 94 DuoNebs as needed Antitussives, flutter valve, incentive spirometer Contact precautions Supportive care-Tamiflu not ordered as patient outside 72-hour window of onset of symptoms, so low therapeutic benefit  Acute renal failure superimposed on stage 3b chronic kidney disease (HCC) Acute metabolic acidosis Likely prerenal secondary to dehydration from decreased oral intake  Received a 1 L fluid bolus in the ED Monitor renal function, avoid nephrotoxins, continue to encourage oral rehydration  Mixed Alzheimer and vascular dementia (HCC) Continue quetiapine, duloxetine with Ativan as needed Delirium precautions  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 Last seen by neurosurgery 2019   History of esophageal ulcer Monitor for reflux symptoms  History of CVA (cerebrovascular accident) No acute issues suspected Continue home meds     DVT prophylaxis: Lovenox  Consults: none  Advance Care Planning:   Code Status: Prior   Family Communication: none  Disposition Plan: Back to previous home environment  Severity of Illness: The appropriate patient status for this patient is INPATIENT. Inpatient status is judged to be reasonable and necessary in order to provide the required intensity of service to ensure the patient's safety. The patient's presenting symptoms, physical exam findings, and initial radiographic and laboratory data in the context of their chronic comorbidities is felt to place them at high risk for further clinical deterioration. Furthermore, it is not anticipated that the patient will be medically stable for  discharge from the hospital within 2 midnights of admission.   * I certify that at the point of admission it is my clinical judgment that the patient will require inpatient hospital care spanning beyond 2 midnights from the point of admission due to high intensity of service, high risk for further deterioration and high frequency of surveillance required.*  Author: Andris Baumann, MD 07/14/2023 10:09 PM  For on call review [email or web address].

## 2023-07-14 NOTE — Assessment & Plan Note (Addendum)
On admission, No acute issues suspected. Continue home meds 07-17-2023 stable. 07-19-2023 stable. 07-20-2023 stable.

## 2023-07-14 NOTE — Assessment & Plan Note (Addendum)
On admission, Supplemental O2 to keep sats over 94. DuoNebs as needed. Antitussives, flutter valve, incentive spirometer. Contact precautions. Supportive care-Tamiflu not ordered as patient outside 72-hour window of onset of symptoms, so low therapeutic benefit. 07-17-2023 continue with supportive care. Outside window of Tamiflu. PT consult. 07-18-2023 start steroids, increase needs to QID. 07-19-2023 continue prednisone 40 mg daily and QID nebs. Continue supportive care.   07-20-2023 wean to RA. Continue prednisone 40 mg daily and QID nebs.

## 2023-07-14 NOTE — ED Triage Notes (Signed)
First Nurse Note: Patient to ED via [facility] from the [facility] for generalized weakness. Per EMS, daughter wanted her to be seen for further evaluation.   EMS VS: 88 HR 104/60 99 temp 91% placed on 2L [place] for some wheezing now 97%

## 2023-07-14 NOTE — Assessment & Plan Note (Addendum)
07-17-2023 stable. Continue quetiapine, duloxetine with Ativan as needed. Delirium precautions 07-18-2023 stable. 07-19-2023 stable. 07-20-2023 stable.

## 2023-07-14 NOTE — Assessment & Plan Note (Addendum)
07-17-2023 stable. Last shunt revision 2016. Last seen by neurosurgery 2019  07-19-2023 stable. 07-20-2023 stable.

## 2023-07-14 NOTE — ED Provider Notes (Signed)
[facility] Provider Note    Event Date/Time   First MD Initiated Contact with Patient 07/14/23 2113     (approximate)   History   Cough   HPI  Kristen Campbell is a 78 y.o. female has been, CKD, prior stroke status post VP shunt, presenting with shortness of breath, cough for the last 3 days.  Per EMS has brought from [facility] with fatigue, body aches, chills, cough.  Found to be 91% on room air by EMS and placed on 2 L nasal cannula.  Patient states that she is not typically on O2.  Had some wheezing on exam for EMS.  Patient denies any abdominal pain, states that she had some chest pain earlier.  No diarrhea.  She is said she had some nausea vomiting earlier but no nausea right now.  On independent chart review she was last admitted to the hospital in October of last year for weakness, altered mental status and hypotension, had a stroke workup that was negative, was found to have an AKI on CKD and treated with IV fluids.  She does have underlying dementia.     Physical Exam   Triage Vital Signs: ED Triage Vitals  Encounter Vitals Group     BP 07/14/23 1503 116/74     Systolic BP Percentile --      Diastolic BP Percentile --      Pulse Rate 07/14/23 1503 92     Resp 07/14/23 1503 18     Temp 07/14/23 1503 98.4 F (36.9 C)     Temp Source 07/14/23 1503 Oral     SpO2 07/14/23 1503 97 %     Weight --      Height --      Head Circumference --      Peak Flow --      Pain Score 07/14/23 1506 2     Pain Loc --      Pain Education --      Exclude from Growth Chart --     Most recent vital signs: Vitals:   07/14/23 1503 07/14/23 1825  BP: 116/74 (!) 148/80  Pulse: 92 87  Resp: 18 20  Temp: 98.4 F (36.9 C) 98.5 F (36.9 C)  SpO2: 97% 96%     General: Awake, no distress.  CV:  Good peripheral perfusion.  Resp:  Normal effort.  Some wheezing on the right but none on the left she is not tachypneic, no retractions Abd:  No  distention.  Soft nontender Other:  No unilateral calf swelling or tenderness   ED Results / Procedures / Treatments   Labs (all labs ordered are listed, but only abnormal results are displayed) Labs Reviewed  RESP PANEL BY RT-PCR (RSV, FLU A&B, COVID)  RVPGX2 - Abnormal; Notable for the following components:      Result Value   Influenza A by PCR POSITIVE (*)    All other components within normal limits  BASIC METABOLIC PANEL - Abnormal; Notable for the following components:   CO2 19 (*)    Glucose, Bld 123 (*)    BUN 50 (*)    Creatinine, Ser 2.75 (*)    Calcium 8.8 (*)    GFR, Estimated 17 (*)    All other components within normal limits  CBC - Abnormal; Notable for the following components:   MCHC 29.8 (*)    All other components within normal limits  TROPONIN I (HIGH SENSITIVITY) - Abnormal; Notable  for the following components:   Troponin I (High Sensitivity) 26 (*)    All other components within normal limits  TROPONIN I (HIGH SENSITIVITY) - Abnormal; Notable for the following components:   Troponin I (High Sensitivity) 23 (*)    All other components within normal limits  BRAIN NATRIURETIC PEPTIDE     EKG  Normal sinus rhythm, rate 86, normal QRS, normal QTc, T wave inversion to aVL, no ischemic ST elevation, T wave inversion is not new compared to prior   RADIOLOGY Chest x-ray my interpretation without focal consolidation   PROCEDURES:  Critical Care performed: Yes, see critical care procedure note(s)  .Critical Care  Performed by: Claybon Jabs, MD Authorized by: Claybon Jabs, MD   Critical care provider statement:    Critical care time (minutes):  40   Critical care was necessary to treat or prevent imminent or life-threatening deterioration of the following conditions:  Respiratory failure   Critical care was time spent personally by me on the following activities:  Development of treatment plan with patient or surrogate, discussions with consultants,  evaluation of patient's response to treatment, examination of patient, ordering and review of laboratory studies, ordering and review of radiographic studies, ordering and performing treatments and interventions, pulse oximetry, re-evaluation of patient's condition and review of old charts    MEDICATIONS ORDERED IN ED: Medications  sodium chloride 0.9 % bolus 1,000 mL (has no administration in time range)  ipratropium-albuterol (DUONEB) 0.5-2.5 (3) MG/3ML nebulizer solution 9 mL (has no administration in time range)     IMPRESSION / MDM / ASSESSMENT AND PLAN / ED COURSE  I reviewed the triage vital signs and the nursing notes.                              Differential diagnosis includes, but is not limited to, viral illness, pneumonia, influenza, COVID, RSV, considered asthma exacerbation the patient is not tachypneic, she has a little bit of wheezing on the right side and no retractions.  Consider atypical ACS.  Also considered PE but patient has no unilateral calf swelling or tenderness, she had mostly URI symptoms which makes infection relation for her symptoms.  Independent review and interpretation of labs and imaging, her troponin was mildly elevated at 26 but is downtrending on repeat, she is flu a positive.  Also with an AKI, electrolytes are not severely deranged, no leukocytosis.  Given that she looks volume down, will give her some IV fluids, will also give her some DuoNebs for the wheezing.  Given her new hypoxia, flu positive, AKI, she will need to be admitted for further management.  Consult to hospitalist agreeable plan for admission will evaluate the patient.  She is admitted.  Patient's presentation is most consistent with acute presentation with potential threat to life or bodily function.    FINAL CLINICAL IMPRESSION(S) / ED DIAGNOSES   Final diagnoses:  Influenza  AKI (acute kidney injury) (HCC)  Hypoxia     Rx / DC Orders   ED Discharge Orders     None         Note:  This document was prepared using Dragon voice recognition software and may include unintentional dictation errors.    Claybon Jabs, MD 07/14/23 [phone]

## 2023-07-14 NOTE — Assessment & Plan Note (Addendum)
On admission, Likely prerenal secondary to dehydration from decreased oral intake. Received a 1 L fluid bolus in the ED. Monitor renal function, avoid nephrotoxins, continue to encourage oral rehydration. 07-17-2023 stable. Baseline Scr 1.7-1.9. hold lasix. 07-18-2023 Scr 1.7 stable. Continue to hold lasix.  07-19-2023 SCR 1.61, BUN 40. Stable.  07-20-2023 stable. Lasix on hold due to poor po intake. Would only use prn when she goes back to facility.

## 2023-07-14 NOTE — ED Triage Notes (Signed)
Pt is brought in by EMS from [facility].  Pt has had a cough for 2 days with fatigue, body aches and chills.  Pt was found to be 91% on RA by ems and was placed on 2L [place]>  in triage she sounds like she has lots of mucous in her chest and she is 9&% on 2L 

## 2023-07-15 ENCOUNTER — Encounter: Payer: Self-pay | Admitting: Internal Medicine

## 2023-07-15 DIAGNOSIS — Z982 Presence of cerebrospinal fluid drainage device: Secondary | ICD-10-CM

## 2023-07-15 DIAGNOSIS — F0150 Vascular dementia without behavioral disturbance: Secondary | ICD-10-CM

## 2023-07-15 DIAGNOSIS — F0280 Dementia in other diseases classified elsewhere without behavioral disturbance: Secondary | ICD-10-CM

## 2023-07-15 DIAGNOSIS — J9601 Acute respiratory failure with hypoxia: Secondary | ICD-10-CM | POA: Diagnosis not present

## 2023-07-15 DIAGNOSIS — G309 Alzheimer's disease, unspecified: Secondary | ICD-10-CM

## 2023-07-15 DIAGNOSIS — J101 Influenza due to other identified influenza virus with other respiratory manifestations: Secondary | ICD-10-CM | POA: Diagnosis not present

## 2023-07-15 DIAGNOSIS — D649 Anemia, unspecified: Secondary | ICD-10-CM

## 2023-07-15 DIAGNOSIS — N179 Acute kidney failure, unspecified: Secondary | ICD-10-CM

## 2023-07-15 DIAGNOSIS — Z8673 Personal history of transient ischemic attack (TIA), and cerebral infarction without residual deficits: Secondary | ICD-10-CM

## 2023-07-15 DIAGNOSIS — N1832 Chronic kidney disease, stage 3b: Secondary | ICD-10-CM

## 2023-07-15 DIAGNOSIS — Z8719 Personal history of other diseases of the digestive system: Secondary | ICD-10-CM

## 2023-07-15 LAB — CBC
HCT: 30.8 % — ABNORMAL LOW (ref 36.0–46.0)
Hemoglobin: 9.5 g/dL — ABNORMAL LOW (ref 12.0–15.0)
MCH: 28.3 pg (ref 26.0–34.0)
MCHC: 30.8 g/dL (ref 30.0–36.0)
MCV: 91.7 fL (ref 80.0–100.0)
Platelets: 157 10*3/uL (ref 150–400)
RBC: 3.36 MIL/uL — ABNORMAL LOW (ref 3.87–5.11)
RDW: 14.7 % (ref 11.5–15.5)
WBC: 2.9 10*3/uL — ABNORMAL LOW (ref 4.0–10.5)
nRBC: 0 % (ref 0.0–0.2)

## 2023-07-15 LAB — BASIC METABOLIC PANEL
Anion gap: 11 (ref 5–15)
BUN: 52 mg/dL — ABNORMAL HIGH (ref 8–23)
CO2: 20 mmol/L — ABNORMAL LOW (ref 22–32)
Calcium: 8.4 mg/dL — ABNORMAL LOW (ref 8.9–10.3)
Chloride: 108 mmol/L (ref 98–111)
Creatinine, Ser: 2.57 mg/dL — ABNORMAL HIGH (ref 0.44–1.00)
GFR, Estimated: 19 mL/min — ABNORMAL LOW (ref >60)
Glucose, Bld: 97 mg/dL (ref 70–99)
Potassium: 4.6 mmol/L (ref 3.5–5.1)
Sodium: 139 mmol/L (ref 135–145)

## 2023-07-15 LAB — LACTIC ACID, PLASMA
Lactic Acid, Venous: 0.7 mmol/L (ref 0.5–1.9)
Lactic Acid, Venous: 0.8 mmol/L (ref 0.5–1.9)

## 2023-07-15 MED ORDER — IPRATROPIUM-ALBUTEROL 0.5-2.5 (3) MG/3ML IN SOLN
3.0000 mL | Freq: Four times a day (QID) | RESPIRATORY_TRACT | Status: DC
  Administered 2023-07-15 – 2023-07-17 (×8): 3 mL via RESPIRATORY_TRACT
  Filled 2023-07-15 (×8): qty 3

## 2023-07-15 MED ORDER — OSELTAMIVIR PHOSPHATE 30 MG PO CAPS
30.0000 mg | ORAL_CAPSULE | Freq: Every day | ORAL | Status: AC
  Administered 2023-07-15 – 2023-07-19 (×5): 30 mg via ORAL
  Filled 2023-07-15 (×6): qty 1

## 2023-07-15 MED ORDER — SODIUM CHLORIDE 0.9 % IV BOLUS
1000.0000 mL | Freq: Once | INTRAVENOUS | Status: AC
Start: 2023-07-15 — End: 2023-07-15
  Administered 2023-07-15: 1000 mL via INTRAVENOUS

## 2023-07-15 MED ORDER — LACTATED RINGERS IV SOLN: INTRAVENOUS | Status: AC

## 2023-07-15 NOTE — ED Notes (Addendum)
Para March, MD made aware of patients hypotension at this time.

## 2023-07-15 NOTE — Progress Notes (Signed)
Progress Note   Patient: Kristen Campbell WUJ:[id number] DOB: 03-29-1946 DOA: 07/14/2023     1 DOS: the patient was seen and examined on 07/15/2023   Brief hospital course: Kristen Campbell is a 78 y.o. female with medical history significant for CVA, brain aneurysm s/p VP shunt, CKD 3B, dementia and hypertension, with last hospitalization in October 2024 for altered mental status rule out for CVA with MRI, who presents to the ED with a 3-day history of cough, shortness of breath, weakness and decreased oral intake.   Patient tested positive for influenza A, started on gentle IV hydration admitted to hospitalist service for further management evaluation of  Assessment and Plan: * Influenza A Acute respiratory failure with hypoxia Continue supplemental oxygen to maintain saturation greater than 92%. Wean as tolerated. Discussed with pharmacy, due to her illness we will start Tamiflu 5-day course, renal dose. Continue antitussives as needed. Encourage flutter valve, incentive spirometer Contact precautions. Continue supportive care.  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 Continue gentle IV hydration. LR rate decreased to 75 mL.  Acidosis improving. Hold Lasix for now. Monitor renal function, avoid nephrotoxins, continue to encourage oral rehydration  Chronic Anemia- Hemoglobin stable around 9.7. Yesterday's hemoglobin of 12 possibly due to dehydration. No active bleeding noted. Monitor H&H daily.  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 Last seen by neurosurgery 2019   History of esophageal ulcer Continue PPI.  History of CVA (cerebrovascular accident) No acute issues  suspected Continue home meds    PT OT evaluation Out of bed to chair. Incentive spirometry. Nursing supportive care. Fall, aspiration precautions. DVT prophylaxis   Code Status: Full Code  Subjective: Patient is seen and examined today morning.  She is lying in bed, feels weak, eating poor.  She is currently on 2 L supplemental oxygen.  Encouraged out of bed to chair, physical therapy.  Physical Exam: Vitals:   07/15/23 0700 07/15/23 0730 07/15/23 0924 07/15/23 1000  BP: (!) 122/55   (!) 115/47  Pulse: 70   84  Resp: 13   14  Temp:   97.9 F (36.6 C)   TempSrc:      SpO2: 100%   94%  Weight:      Height:  5' (1.524 m)      General - Elderly ill Caucasian female, mild respiratory distress HEENT - PERRLA, EOMI, atraumatic head, non tender sinuses. Lung - Clear, basal rales, rhonchi, no wheezes. Heart - S1, S2 heard, no murmurs, rubs, trace pedal edema. Abdomen - Soft, non tender non distended, bowel sounds good Neuro - Alert, awake and oriented x 3, non focal exam. Skin - Warm and dry.  Data Reviewed:      Latest Ref Rng & Units 07/15/2023    3:06 AM 07/14/2023    3:17 PM 06/09/2023    6:57 PM  CBC  WBC 4.0 - 10.5 K/uL 2.9  4.2  10.6   Hemoglobin 12.0 - 15.0 g/dL 9.5  95.6  21.3   Hematocrit 36.0 - 46.0 % 30.8  42.6  34.5   Platelets 150 - 400 K/uL 157  185  191       Latest Ref Rng & Units 07/15/2023    3:06 AM 07/14/2023  3:17 PM 06/09/2023    6:57 PM  BMP  Glucose 70 - 99 mg/dL 97  119  147   BUN 8 - 23 mg/dL 52  50  44   Creatinine 0.44 - 1.00 mg/dL 8.29  5.62  1.30   Sodium 135 - 145 mmol/L 139  136  130   Potassium 3.5 - 5.1 mmol/L 4.6  4.9  4.3   Chloride 98 - 111 mmol/L 108  105  107   CO2 22 - 32 mmol/L 20  19  23    Calcium 8.9 - 10.3 mg/dL 8.4  8.8  9.4    DG Chest 2 View Result Date: 07/14/2023 CLINICAL DATA:  Cough for 2 days. EXAM: CHEST - 2 VIEW COMPARISON:  June 09, 2023. FINDINGS: The heart size and mediastinal contours are within  normal limits. Both lungs are clear. The visualized skeletal structures are unremarkable. IMPRESSION: No active cardiopulmonary disease. Electronically Signed   By: Lupita Raider M.D.   On: 07/14/2023 16:36   Family Communication: Discussed with patient she understand and agree. All questions answereed.  Disposition: Status is: Inpatient Remains inpatient appropriate because: Influenza, hypoxia  Planned Discharge Destination:  ALF     Time spent: 40 minutes  Author: Marcelino Duster, MD 07/15/2023 10:57 AM Secure chat 7am to 7pm For on call review [email or web address].

## 2023-07-15 NOTE — ED Notes (Signed)
Called to room by pt who states that she can not breath, O2 sats in low 80s, pt working harder to breath, noted expiratory wheezes.  Provider notified, new orders received, Duo neb administered as ordered.  Will monitor.

## 2023-07-15 NOTE — ED Notes (Signed)
Pt cleaned, linens changed dry brief placed.

## 2023-07-16 DIAGNOSIS — J9601 Acute respiratory failure with hypoxia: Secondary | ICD-10-CM | POA: Diagnosis not present

## 2023-07-16 DIAGNOSIS — G309 Alzheimer's disease, unspecified: Secondary | ICD-10-CM | POA: Diagnosis not present

## 2023-07-16 DIAGNOSIS — N179 Acute kidney failure, unspecified: Secondary | ICD-10-CM | POA: Diagnosis not present

## 2023-07-16 DIAGNOSIS — J101 Influenza due to other identified influenza virus with other respiratory manifestations: Secondary | ICD-10-CM | POA: Diagnosis not present

## 2023-07-16 LAB — CBC
HCT: 31 % — ABNORMAL LOW (ref 36.0–46.0)
Hemoglobin: 9.6 g/dL — ABNORMAL LOW (ref 12.0–15.0)
MCH: 28 pg (ref 26.0–34.0)
MCHC: 31 g/dL (ref 30.0–36.0)
MCV: 90.4 fL (ref 80.0–100.0)
Platelets: 137 10*3/uL — ABNORMAL LOW (ref 150–400)
RBC: 3.43 MIL/uL — ABNORMAL LOW (ref 3.87–5.11)
RDW: 14.7 % (ref 11.5–15.5)
WBC: 2.6 10*3/uL — ABNORMAL LOW (ref 4.0–10.5)
nRBC: 0 % (ref 0.0–0.2)

## 2023-07-16 LAB — BASIC METABOLIC PANEL
Anion gap: 12 (ref 5–15)
BUN: 36 mg/dL — ABNORMAL HIGH (ref 8–23)
CO2: 21 mmol/L — ABNORMAL LOW (ref 22–32)
Calcium: 8.7 mg/dL — ABNORMAL LOW (ref 8.9–10.3)
Chloride: 109 mmol/L (ref 98–111)
Creatinine, Ser: 1.6 mg/dL — ABNORMAL HIGH (ref 0.44–1.00)
GFR, Estimated: 33 mL/min — ABNORMAL LOW (ref >60)
Glucose, Bld: 104 mg/dL — ABNORMAL HIGH (ref 70–99)
Potassium: 4.2 mmol/L (ref 3.5–5.1)
Sodium: 142 mmol/L (ref 135–145)

## 2023-07-16 MED ORDER — FUROSEMIDE 20 MG PO TABS
20.0000 mg | ORAL_TABLET | Freq: Every day | ORAL | Status: DC
Start: 2023-07-16 — End: 2023-07-17
  Administered 2023-07-16 – 2023-07-17 (×2): 20 mg via ORAL
  Filled 2023-07-16 (×2): qty 1

## 2023-07-16 MED ORDER — DULOXETINE HCL 30 MG PO CPEP
60.0000 mg | ORAL_CAPSULE | Freq: Every day | ORAL | Status: DC
  Administered 2023-07-16 – 2023-07-21 (×6): 60 mg via ORAL
  Filled 2023-07-16 (×6): qty 2

## 2023-07-16 NOTE — Progress Notes (Signed)
Progress Note   Patient: Kristen Campbell ZOX:[id number] DOB: 25-Jun-1945 DOA: 07/14/2023     2 DOS: the patient was seen and examined on 07/16/2023   Brief hospital course: Hope Stuckert is a 78 y.o. female with medical history significant for CVA, brain aneurysm s/p VP shunt, CKD 3B, dementia and hypertension, with last hospitalization in October 2024 for altered mental status rule out for CVA with MRI, who presents to the ED with a 3-day history of cough, shortness of breath, weakness and decreased oral intake.   Patient tested positive for influenza A, started on gentle IV hydration admitted to hospitalist service for further management evaluation.   Assessment and Plan: * Influenza A Acute respiratory failure with hypoxia Continue supplemental oxygen to maintain saturation greater than 92%. She is on 4L, wean as tolerated. Continue Tamiflu 5-day course, renal dose. Continue antitussives as needed. Encourage flutter valve, incentive spirometer Contact precautions. Continue supportive care.  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 Her kidney function better and baseline.  Stop IV hydration.  Acidosis improving.  Resumed home dose lasix. Monitor renal function, avoid nephrotoxins, continue to encourage oral diet.  Chronic Anemia- Hemoglobin stable around 9.6. At presentation her hemoglobin of 12 possibly due to dehydration. No active bleeding noted.  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 Last seen by neurosurgery 2019. No acute issues.  History of esophageal ulcer Continue PPI.    PT OT evaluation. She is bed bound uses wheel chair. Nursing supportive care. Fall,  aspiration precautions. DVT prophylaxis   Code Status: Full Code  Subjective: Patient is seen and examined today morning.  She is lying in bed, feels better. Eating fair. Yesterday her O2 bumped to 4L due to sob, hypoxia to 70% on 2 L supplemental oxygen.  Encouraged out of bed to chair, incentive spirometry, physical therapy.  Physical Exam: Vitals:   07/16/23 0310 07/16/23 0343 07/16/23 0729 07/16/23 1358  BP:  (!) 133/39 (!) 112/54 (!) 100/51  Pulse:  81 73 85  Resp:  20 18   Temp:  (!) 97.5 F (36.4 C) 98.4 F (36.9 C) 98.6 F (37 C)  TempSrc:  Oral Oral Oral  SpO2: 100% 98% 97% 98%  Weight:      Height:        General - Elderly ill Caucasian female, mild respiratory distress HEENT - PERRLA, EOMI, atraumatic head, non tender sinuses. Lung - Clear, basal rales, rhonchi, no wheezes. Heart - S1, S2 heard, no murmurs, rubs, trace pedal edema. Abdomen - Soft, non tender non distended, bowel sounds good Neuro - Alert, awake and oriented x 3, non focal exam. Skin - Warm and dry.  Data Reviewed:      Latest Ref Rng & Units 07/16/2023    9:07 AM 07/15/2023    3:06 AM 07/14/2023    3:17 PM  CBC  WBC 4.0 - 10.5 K/uL 2.6  2.9  4.2   Hemoglobin 12.0 - 15.0 g/dL 9.6  9.5  81.1   Hematocrit 36.0 - 46.0 % 31.0  30.8  42.6   Platelets 150 - 400 K/uL 137  157  185       Latest Ref Rng & Units 07/16/2023    9:07 AM 07/15/2023  3:06 AM 07/14/2023    3:17 PM  BMP  Glucose 70 - 99 mg/dL 595  97  638   BUN 8 - 23 mg/dL 36  52  50   Creatinine 0.44 - 1.00 mg/dL 7.56  4.33  2.95   Sodium 135 - 145 mmol/L 142  139  136   Potassium 3.5 - 5.1 mmol/L 4.2  4.6  4.9   Chloride 98 - 111 mmol/L 109  108  105   CO2 22 - 32 mmol/L 21  20  19    Calcium 8.9 - 10.3 mg/dL 8.7  8.4  8.8    DG Chest 2 View Result Date: 07/14/2023 CLINICAL DATA:  Cough for 2 days. EXAM: CHEST - 2 VIEW COMPARISON:  June 09, 2023. FINDINGS: The heart size and mediastinal contours are within normal limits. Both  lungs are clear. The visualized skeletal structures are unremarkable. IMPRESSION: No active cardiopulmonary disease. Electronically Signed   By: Lupita Raider M.D.   On: 07/14/2023 16:36   Family Communication: Discussed with patient she understand and agree. All questions answereed.  Disposition: Status is: Inpatient Remains inpatient appropriate because: Influenza, hypoxia  Planned Discharge Destination:  ALF     Time spent: 40 minutes  Author: Marcelino Duster, MD 07/16/2023 2:12 PM Secure chat 7am to 7pm For on call review [email or web address].

## 2023-07-16 NOTE — Plan of Care (Signed)

## 2023-07-16 NOTE — Evaluation (Signed)
Physical Therapy Evaluation Patient Details Name: Kristen Campbell MRN: [id number] DOB: September 01, 1945 Today's Date: 07/16/2023  History of Present Illness  Pt is a 78 y.o. female presenting to hospital 07/14/23 with c/o fatigue, body aches, chills, cough for past 3 days. (+) Influenza A.  Pt admitted with influenza A, acute respiratory failure with hypoxia, acute renal failure, acute metabolic acidosis.   PMH includes htn, CVA, CKD, dementia, CSF shunt, RCR, abdominal sx, cerebral aneurysm repair, abdominal sx.  Clinical Impression  Prior to recent medical concerns, pt reports requiring assist with bed mobility and w/c level transfers; pt reports she propels manual w/c on own (slowly but can do it); lives at a facility.  Pt c/o generalized body aches/pains during session (nurse notified).  Currently pt is max assist semi-supine to sitting EOB and max assist with squat pivot transfer bed to recliner.  Pt would currently benefit from skilled PT to address noted impairments and functional limitations (see below for any additional details).  Upon hospital discharge, pt would benefit from ongoing therapy.     If plan is discharge home, recommend the following: A lot of help with walking and/or transfers;A little help with bathing/dressing/bathroom;Assistance with cooking/housework;Assist for transportation;Help with stairs or ramp for entrance   Can travel by private vehicle        Equipment Recommendations None recommended by PT (pt has manual w/c at facility already per pt report)  Recommendations for Other Services       Functional Status Assessment Patient has had a recent decline in their functional status and demonstrates the ability to make significant improvements in function in a reasonable and predictable amount of time.     Precautions / Restrictions Precautions Precautions: Fall Restrictions Weight Bearing Restrictions Per Provider Order: No      Mobility  Bed Mobility Overal bed  mobility: Needs Assistance Bed Mobility: Supine to Sit     Supine to sit: Max assist, HOB elevated     General bed mobility comments: assist for trunk and B LE's    Transfers Overall transfer level: Needs assistance Equipment used: None Transfers: Bed to chair/wheelchair/BSC       Squat pivot transfers: Max assist     General transfer comment: squat pivot transfer bed to recliner (to R side); vc's for technique    Ambulation/Gait               General Gait Details: Pt reports being non-ambulatory  Stairs            Wheelchair Mobility     Tilt Bed    Modified Rankin (Stroke Patients Only)       Balance Overall balance assessment: Needs assistance Sitting-balance support: No upper extremity supported, Feet supported Sitting balance-Leahy Scale: Good Sitting balance - Comments: steady reaching within BOS                                     Pertinent Vitals/Pain Pain Assessment Pain Assessment: Faces Faces Pain Scale: Hurts little more Pain Location: generalized body aches Pain Descriptors / Indicators: Aching Pain Intervention(s): Limited activity within patient's tolerance, Monitored during session, Repositioned, Patient requesting pain meds-RN notified HR stable and SpO2 sats 94% or greater on 3 L O2 via nasal cannula during sessions activities.    Home Living Family/patient expects to be discharged to:: Assisted living  Home Equipment: Wheelchair - manual Additional Comments: ALF ([facility] per chart review)    Prior Function Prior Level of Function : Needs assist       Physical Assist : Mobility (physical);ADLs (physical) Mobility (physical): Transfers;Bed mobility ADLs (physical): Bathing;Dressing Mobility Comments: Pt reports she requires assist with bed mobility and transfers (w/c level) and calls for assist; pt propels manual w/c on own (slowly but can do it)       Extremity/Trunk  Assessment   Upper Extremity Assessment Upper Extremity Assessment: Generalized weakness    Lower Extremity Assessment Lower Extremity Assessment: Generalized weakness    Cervical / Trunk Assessment Cervical / Trunk Assessment: Other exceptions Cervical / Trunk Exceptions: forward head/shoulders  Communication   Communication Communication: No apparent difficulties Cueing Techniques: Verbal cues  Cognition Arousal: Alert Behavior During Therapy: WFL for tasks assessed/performed Overall Cognitive Status: No family/caregiver present to determine baseline cognitive functioning                                 General Comments: Oriented to person, hospital, and general situation; pt reports she typically does not keep track of the date/time        General Comments  Nursing cleared pt for participation in physical therapy.  Pt agreeable to PT session.    Exercises     Assessment/Plan    PT Assessment Patient needs continued PT services  PT Problem List Decreased strength;Decreased activity tolerance;Decreased mobility;Cardiopulmonary status limiting activity;Pain       PT Treatment Interventions DME instruction;Functional mobility training;Therapeutic activities;Therapeutic exercise;Balance training;Patient/family education;Wheelchair mobility training    PT Goals (Current goals can be found in the Care Plan section)  Acute Rehab PT Goals Patient Stated Goal: to feel and breathe better PT Goal Formulation: With patient Time For Goal Achievement: 07/30/23 Potential to Achieve Goals: Good    Frequency Min 1X/week     Co-evaluation               AM-PAC PT "6 Clicks" Mobility  Outcome Measure Help needed turning from your back to your side while in a flat bed without using bedrails?: A Little Help needed moving from lying on your back to sitting on the side of a flat bed without using bedrails?: A Lot Help needed moving to and from a bed to a chair  (including a wheelchair)?: A Lot Help needed standing up from a chair using your arms (e.g., wheelchair or bedside chair)?: Total Help needed to walk in hospital room?: Total Help needed climbing 3-5 steps with a railing? : Total 6 Click Score: 10    End of Session Equipment Utilized During Treatment: Gait belt Activity Tolerance: Patient tolerated treatment well Patient left: in chair;with call bell/phone within reach;with chair alarm set Nurse Communication: Mobility status;Precautions;Patient requests pain meds PT Visit Diagnosis: Other abnormalities of gait and mobility (R26.89);Muscle weakness (generalized) (M62.81);Pain Pain - part of body:  (generalized body pain)    Time: 1610-9604 PT Time Calculation (min) (ACUTE ONLY): 27 min   Charges:   PT Evaluation $PT Eval Low Complexity: 1 Low PT Treatments $Therapeutic Activity: 8-22 mins PT General Charges $$ ACUTE PT VISIT: 1 Visit        Hendricks Limes, PT 07/16/23, 4:25 PM

## 2023-07-17 ENCOUNTER — Encounter: Payer: Self-pay | Admitting: Internal Medicine

## 2023-07-17 DIAGNOSIS — I10 Essential (primary) hypertension: Secondary | ICD-10-CM

## 2023-07-17 DIAGNOSIS — E8721 Acute metabolic acidosis: Secondary | ICD-10-CM

## 2023-07-17 DIAGNOSIS — N179 Acute kidney failure, unspecified: Secondary | ICD-10-CM | POA: Diagnosis not present

## 2023-07-17 DIAGNOSIS — J101 Influenza due to other identified influenza virus with other respiratory manifestations: Secondary | ICD-10-CM | POA: Diagnosis not present

## 2023-07-17 DIAGNOSIS — J9601 Acute respiratory failure with hypoxia: Secondary | ICD-10-CM | POA: Diagnosis not present

## 2023-07-17 LAB — RESPIRATORY PANEL BY PCR

## 2023-07-17 LAB — BASIC METABOLIC PANEL
Anion gap: 9 (ref 5–15)
BUN: 35 mg/dL — ABNORMAL HIGH (ref 8–23)
CO2: 24 mmol/L (ref 22–32)
Calcium: 8.4 mg/dL — ABNORMAL LOW (ref 8.9–10.3)
Chloride: 108 mmol/L (ref 98–111)
Creatinine, Ser: 1.69 mg/dL — ABNORMAL HIGH (ref 0.44–1.00)
GFR, Estimated: 31 mL/min — ABNORMAL LOW (ref >60)
Glucose, Bld: 93 mg/dL (ref 70–99)
Potassium: 4.2 mmol/L (ref 3.5–5.1)
Sodium: 141 mmol/L (ref 135–145)

## 2023-07-17 LAB — CBC
HCT: 27.1 % — ABNORMAL LOW (ref 36.0–46.0)
Hemoglobin: 8.6 g/dL — ABNORMAL LOW (ref 12.0–15.0)
MCH: 28.3 pg (ref 26.0–34.0)
MCHC: 31.7 g/dL (ref 30.0–36.0)
MCV: 89.1 fL (ref 80.0–100.0)
Platelets: 136 10*3/uL — ABNORMAL LOW (ref 150–400)
RBC: 3.04 MIL/uL — ABNORMAL LOW (ref 3.87–5.11)
RDW: 14.8 % (ref 11.5–15.5)
WBC: 2.4 10*3/uL — ABNORMAL LOW (ref 4.0–10.5)
nRBC: 0 % (ref 0.0–0.2)

## 2023-07-17 MED ORDER — IPRATROPIUM-ALBUTEROL 0.5-2.5 (3) MG/3ML IN SOLN
3.0000 mL | Freq: Two times a day (BID) | RESPIRATORY_TRACT | Status: DC
  Administered 2023-07-17 – 2023-07-18 (×2): 3 mL via RESPIRATORY_TRACT
  Filled 2023-07-17 (×2): qty 3

## 2023-07-17 MED ORDER — ENOXAPARIN SODIUM 30 MG/0.3ML IJ SOSY
30.0000 mg | PREFILLED_SYRINGE | INTRAMUSCULAR | Status: DC
  Administered 2023-07-17 – 2023-07-20 (×4): 30 mg via SUBCUTANEOUS
  Filled 2023-07-17 (×4): qty 0.3

## 2023-07-17 NOTE — Progress Notes (Addendum)
PROGRESS NOTE    Kristen Campbell  ZOX:[id number] DOB: 1945/11/20 DOA: 07/14/2023 PCP: Emi Belfast, FNP  Subjective: Pt seen and examined. Pt still on supplemental O2. Pt not on home O2.   Hospital Course: HPI: Kristen Campbell is a 78 y.o. female with medical history significant for CVA, brain aneurysm s/p VP shunt, CKD 3B, dementia and hypertension, with last hospitalization in October 2024 for altered mental status rule out for CVA with MRI, who presents to the ED with a 3-day history of cough, shortness of breath, weakness and decreased oral intake, with O2 sat 91% with EMS and no history of prior O2 requirement.  She denied chest pain. ED course and data review: Vitals within normal limits Workup notable for influenza A+ with normal WBC and negative CXR Troponin 23 and BNP 32.8 BMP notable for creatinine of 2, up from baseline of 1.78 for her CKD with bicarb of 19 EKG, personally viewed and interpreted showing NSR at 86 with no concerning ischemic ST-T wave changes Chest x-ray with no active cardiopulmonary disease Patient treated with a DuoNeb and given an NS 1 L bolus Hospitalist consulted for admission for influenza A with acute respiratory failure.  Tamiflu not started as patient on the outside 72-hour window.  Significant Events: Admitted 07/14/2023 for influenza A and acute respiratory failure with hypoxia   Significant Labs: WBC 4.2, HgB 12.7, plt 185 Na 136, K 4.9, Bicarb 19, BUN 50, Scr 2.75 Influenza A POSITIVE BNP 32.8  Significant Imaging Studies: CXR shows No active cardiopulmonary disease.   Antibiotic Therapy: Anti-infectives (From admission, onward)    Start     Dose/Rate Route Frequency Ordered Stop   07/15/23 1000  oseltamivir (TAMIFLU) capsule 30 mg        30 mg Oral Daily 07/15/23 0859 07/20/23 0959       Procedures:   Consultants:     Assessment and Plan: * Influenza A On admission, Supplemental O2 to keep sats over 94. DuoNebs as  needed. Antitussives, flutter valve, incentive spirometer. Contact precautions. Supportive care-Tamiflu not ordered as patient outside 72-hour window of onset of symptoms, so low therapeutic benefit.  07-17-2023 continue with supportive care. Outside window of Tamiflu. PT consult.  Acute metabolic acidosis 07-17-2023 likely due to poor po intake. Resolved with IVF.  Acute respiratory failure with hypoxia (HCC) 07-17-2023 continue with supplemental O2.  Wean as tolerated.  Acute renal failure superimposed on stage 3b chronic kidney disease (HCC) On admission, Likely prerenal secondary to dehydration from decreased oral intake. Received a 1 L fluid bolus in the ED. Monitor renal function, avoid nephrotoxins, continue to encourage oral rehydration.  07-17-2023 stable. Baseline Scr 1.7-1.9. hold lasix.  Mixed Alzheimer and vascular dementia (HCC) 07-17-2023 stable. Continue quetiapine, duloxetine with Ativan as needed. 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 Last shunt revision 2016. Last seen by neurosurgery 2019   History of esophageal ulcer 07-17-2023 stable. Monitor for reflux symptoms  History of CVA (cerebrovascular accident) On admission, No acute issues suspected. Continue home meds  07-17-2023 stable.  Essential hypertension 07-17-2023 stable.   DVT prophylaxis: enoxaparin (LOVENOX) injection 40 mg Start: 07/14/23 2230    Code Status: Full Code Family Communication: no family at bedside Disposition Plan: return [facility](ALF) Reason for continuing need for hospitalization: monitor overnight. Remains on supplemental O2.  Objective: Vitals:   07/17/23 0300 07/17/23 0500 07/17/23 0751 07/17/23 0807  BP:  (!) 116/52  (!) 117/54  Pulse:  71  83  Resp:    18  Temp:  97.7 F (36.5 C)  98 F (36.7 C)  TempSrc:  Oral  Oral  SpO2: 97% 98% 98% 98%  Weight:      Height:         Intake/Output Summary (Last 24 hours) at 07/17/2023 1256 Last data filed at 07/17/2023 0454 Gross per 24 hour  Intake 240 ml  Output 2000 ml  Net -1760 ml   Filed Weights   07/15/23 0046  Weight: 72.3 kg    Examination:  Physical Exam Vitals and nursing note reviewed.  Constitutional:      General: She is not in acute distress.    Appearance: She is not toxic-appearing or diaphoretic.  HENT:     Head: Normocephalic and atraumatic.  Eyes:     General: No scleral icterus. Cardiovascular:     Rate and Rhythm: Normal rate and regular rhythm.  Pulmonary:     Effort: Pulmonary effort is normal.     Breath sounds: Normal breath sounds.  Abdominal:     General: Bowel sounds are normal. There is no distension.     Palpations: Abdomen is soft.  Musculoskeletal:     Right lower leg: No edema.     Left lower leg: No edema.  Skin:    General: Skin is warm and dry.     Capillary Refill: Capillary refill takes less than 2 seconds.  Neurological:     Mental Status: She is alert and oriented to person, place, and time.     Data Reviewed: I have personally reviewed following labs and imaging studies  CBC: Recent Labs  Lab 07/14/23 1517 07/15/23 0306 07/16/23 0907 07/17/23 0458  WBC 4.2 2.9* 2.6* 2.4*  HGB 12.7 9.5* 9.6* 8.6*  HCT 42.6 30.8* 31.0* 27.1*  MCV 94.2 91.7 90.4 89.1  PLT 185 157 137* 136*   Basic Metabolic Panel: Recent Labs  Lab 07/14/23 1517 07/15/23 0306 07/16/23 0907 07/17/23 0458  NA 136 139 142 141  K 4.9 4.6 4.2 4.2  CL 105 108 109 108  CO2 19* 20* 21* 24  GLUCOSE 123* 97 104* 93  BUN 50* 52* 36* 35*  CREATININE 2.75* 2.57* 1.60* 1.69*  CALCIUM 8.8* 8.4* 8.7* 8.4*   GFR: Estimated Creatinine Clearance: 25.5 mL/min (A) (by C-G formula based on SCr of 1.69 mg/dL (H)).  BNP (last 3 results) Recent Labs    07/14/23 1517  BNP 32.8   Sepsis Labs: Recent Labs  Lab 07/15/23 0629 07/15/23 0925  LATICACIDVEN 0.7 0.8    Recent Results  (from the past 240 hours)  Resp panel by RT-PCR (RSV, Flu A&B, Covid) Anterior Nasal Swab     Status: Abnormal   Collection Time: 07/14/23  3:17 PM   Specimen: Anterior Nasal Swab  Result Value Ref Range Status   SARS Coronavirus 2 by RT PCR NEGATIVE NEGATIVE Final    Comment: (NOTE) SARS-CoV-2 target nucleic acids are NOT DETECTED.  The SARS-CoV-2 RNA is generally detectable in upper respiratory specimens during the acute phase of infection. The lowest concentration of SARS-CoV-2 viral copies this assay can detect is 138 copies/mL. A negative result does not preclude SARS-Cov-2 infection and should not be used as the sole basis for treatment or other patient management decisions. A negative result may occur with  improper specimen collection/handling, submission of specimen other than nasopharyngeal swab, presence of viral mutation(s) within the areas targeted by this assay, and inadequate number of  viral copies(<138 copies/mL). A negative result must be combined with clinical observations, patient history, and epidemiological information. The expected result is Negative.  Fact Sheet for Patients:  BloggerCourse.com  Fact Sheet for Healthcare Providers:  SeriousBroker.it  This test is no t yet approved or cleared by the [place] FDA and  has been authorized for detection and/or diagnosis of SARS-CoV-2 by FDA under an Emergency Use Authorization (EUA). This EUA will remain  in effect (meaning this test can be used) for the duration of the COVID-19 declaration under Section 564(b)(1) of the Act, 21 U.S.C.section 360bbb-3(b)(1), unless the authorization is terminated  or revoked sooner.       Influenza A by PCR POSITIVE (A) NEGATIVE Final   Influenza B by PCR NEGATIVE NEGATIVE Final    Comment: (NOTE) The Xpert Xpress SARS-CoV-2/FLU/RSV plus assay is intended as an aid in the diagnosis of influenza from Nasopharyngeal swab  specimens and should not be used as a sole basis for treatment. Nasal washings and aspirates are unacceptable for Xpert Xpress SARS-CoV-2/FLU/RSV testing.  Fact Sheet for Patients: BloggerCourse.com  Fact Sheet for Healthcare Providers: SeriousBroker.it  This test is not yet approved or cleared by the [place] FDA and has been authorized for detection and/or diagnosis of SARS-CoV-2 by FDA under an Emergency Use Authorization (EUA). This EUA will remain in effect (meaning this test can be used) for the duration of the COVID-19 declaration under Section 564(b)(1) of the Act, 21 U.S.C. section 360bbb-3(b)(1), unless the authorization is terminated or revoked.     Resp Syncytial Virus by PCR NEGATIVE NEGATIVE Final    Comment: (NOTE) Fact Sheet for Patients: BloggerCourse.com  Fact Sheet for Healthcare Providers: SeriousBroker.it  This test is not yet approved or cleared by the [place] FDA and has been authorized for detection and/or diagnosis of SARS-CoV-2 by FDA under an Emergency Use Authorization (EUA). This EUA will remain in effect (meaning this test can be used) for the duration of the COVID-19 declaration under Section 564(b)(1) of the Act, 21 U.S.C. section 360bbb-3(b)(1), unless the authorization is terminated or revoked.  Performed at [facility], [address] [place], [place] [address]   Respiratory (~20 pathogens) panel by PCR     Status: Abnormal   Collection Time: 07/16/23  3:36 PM   Specimen: Nasopharyngeal Swab; Respiratory  Result Value Ref Range Status   Adenovirus NOT DETECTED NOT DETECTED Final   Coronavirus 229E NOT DETECTED NOT DETECTED Final    Comment: (NOTE) The Coronavirus on the Respiratory Panel, DOES NOT test for the novel  Coronavirus (2019 nCoV)    Coronavirus HKU1 NOT DETECTED NOT DETECTED Final   Coronavirus NL63 NOT  DETECTED NOT DETECTED Final   Coronavirus OC43 NOT DETECTED NOT DETECTED Final   Metapneumovirus NOT DETECTED NOT DETECTED Final   Rhinovirus / Enterovirus NOT DETECTED NOT DETECTED Final   Influenza A H1 2009 DETECTED (A) NOT DETECTED Final   Influenza B NOT DETECTED NOT DETECTED Final   Parainfluenza Virus 1 NOT DETECTED NOT DETECTED Final   Parainfluenza Virus 2 NOT DETECTED NOT DETECTED Final   Parainfluenza Virus 3 NOT DETECTED NOT DETECTED Final   Parainfluenza Virus 4 NOT DETECTED NOT DETECTED Final   Respiratory Syncytial Virus NOT DETECTED NOT DETECTED Final   Bordetella pertussis NOT DETECTED NOT DETECTED Final   Bordetella Parapertussis NOT DETECTED NOT DETECTED Final   Chlamydophila pneumoniae NOT DETECTED NOT DETECTED Final   Mycoplasma pneumoniae NOT DETECTED NOT DETECTED Final    Comment:  Performed at [facility], [address] [place], [place] [address]     Radiology Studies: No results found.  Scheduled Meds:  DULoxetine  60 mg Oral Daily   enoxaparin (LOVENOX) injection  40 mg Subcutaneous Q24H   ipratropium-albuterol  3 mL Nebulization BID   oseltamivir  30 mg Oral Daily   pantoprazole  40 mg Oral Daily   pregabalin  50 mg Oral TID   QUEtiapine  12.5 mg Oral BID   Continuous Infusions:   LOS: 3 days   Time spent: 40 minutes  Carollee Herter, DO  [facility]  07/17/2023, 12:56 PM

## 2023-07-17 NOTE — Assessment & Plan Note (Signed)
07-17-2023 stable. 07-18-2023 stable BP 07-19-2023 stable. 07-20-2023 BP started to climb. Start norvasc 5 mg daily. May be from steroids.

## 2023-07-17 NOTE — TOC CM/SW Note (Addendum)
Per chart review, patient is from [facility]. HHPT rec, per [facility] patient is active with [facility] - CSW called Shelia with [facility] who states their system is down so she can't confirm yet. Reached out to Alanreed at [employer], awaiting response.   Alfonso Ramus, LCSW Transitions of Care Department [phone]

## 2023-07-17 NOTE — Evaluation (Addendum)
Occupational Therapy Evaluation Patient Details Name: Kristen Campbell MRN: [id number] DOB: [birth year] Today's Date: 07/17/2023   History of Present Illness Pt is a 78 y.o. female presenting to hospital 07/14/23 with c/o fatigue, body aches, chills, cough for past 3 days. (+) Influenza A.  Pt admitted with influenza A, acute respiratory failure with hypoxia, acute renal failure, acute metabolic acidosis.   PMH includes htn, CVA, CKD, dementia, CSF shunt, RCR, abdominal sx, cerebral aneurysm repair, abdominal sx.   Clinical Impression   Pt. presents with weakness, 7/10 BLE pain, decreased activity tolerance, and limited functional mobility which hinders her ability to complete ADL, and  IADL tasks. Pt. reports residing at [facility]. Pt. mostly used a w/c to maneuver throughout the facility. Pt. required assist with transfers. Pt. required assist with morning ADL care needs including: bathing, dressing, and toileting care. Pt. reports staff assisted her with showers once a week using a wheeled/rolling shower chair. Pt. Reports eating meals in the cafeteria. Pt. Reports that she actively participated in activities including BINGO. Pt. Reports enjoying listening to country music, and really enjoys going for rides in a car while listening to music.  Pt. Is on 3LO2 with SpO2 96%,  HR 85 bpms. Pt. Reports the O2 is new, and was not on it PTA. Pt. Requires Set-up MinA grooming, ModA UE, and MaxA LEs. Pt. Education was provided about  PLB techniques. Pt. will benefit from OT services for ADL training, A/E training, and pt./caregiver education about ADL functioning, positioning, and PLB technigues.       If plan is discharge home, recommend the following: A lot of help with walking and/or transfers;A lot of help with bathing/dressing/bathroom    Functional Status Assessment  Patient has had a recent decline in their functional status and demonstrates the ability to make significant improvements in function in a  reasonable and predictable amount of time.  Equipment Recommendations       Recommendations for Other Services       Precautions / Restrictions Precautions Precautions: Fall Restrictions Weight Bearing Restrictions Per Provider Order: No      Mobility Bed Mobility               General bed mobility comments: MaxA for repositioning for comfort    Transfers   Deferred 2/2 BLE pain  MaxA SPTs per Chart/PT report                    Balance                                           ADL either performed or assessed with clinical judgement   ADL Overall ADL's : Needs assistance/impaired     Grooming: Set up;Minimal assistance           Upper Body Dressing : Moderate assistance   Lower Body Dressing: Maximal assistance               Functional mobility during ADLs: Maximal assistance       Vision         Perception         Praxis         Pertinent Vitals/Pain Pain Assessment Pain Assessment: 0-10 Pain Score: 7  Pain Location: BLE pain Pain Descriptors / Indicators: Aching Pain Intervention(s): Limited activity within patient's tolerance, Monitored during session, Repositioned  Extremity/Trunk Assessment Upper Extremity Assessment Upper Extremity Assessment: Generalized weakness           Communication Communication Communication: No apparent difficulties Cueing Techniques: Verbal cues   Cognition Arousal: Alert Behavior During Therapy: WFL for tasks assessed/performed Overall Cognitive Status: No family/caregiver present to determine baseline cognitive functioning                                       General Comments       Exercises     Shoulder Instructions      Home Living Family/patient expects to be discharged to:: Assisted living                             Home Equipment: Wheelchair - manual   Additional Comments: ALF ([facility] per chart  review)      Prior Functioning/Environment Prior Level of Function : Needs assist       Physical Assist : Mobility (physical);ADLs (physical) Mobility (physical): Bed mobility;Transfers ADLs (physical): Bathing;Dressing   ADLs Comments: Pt. reports Independent with set-up for meals, self-grooming, assit with set-up fropm dressing, toelting care. Pt. required assist with showers one time a wee using a wheeled shower chair.        OT Problem List:        OT Treatment/Interventions: Self-care/ADL training;Therapeutic exercise;DME and/or AE instruction;Patient/family education;Therapeutic activities    OT Goals(Current goals can be found in the care plan section) Acute Rehab OT Goals Patient Stated Goal: To feel  better OT Goal Formulation: With patient Time For Goal Achievement: 07/31/23 Potential to Achieve Goals: Good  OT Frequency: Min 1X/week    Co-evaluation              AM-PAC OT "6 Clicks" Daily Activity     Outcome Measure Help from another person eating meals?: A Little Help from another person taking care of personal grooming?: A Little Help from another person toileting, which includes using toliet, bedpan, or urinal?: A Lot Help from another person bathing (including washing, rinsing, drying)?: A Lot Help from another person to put on and taking off regular upper body clothing?: A Little Help from another person to put on and taking off regular lower body clothing?: A Lot 6 Click Score: 15   End of Session    Activity Tolerance: Patient limited by pain Patient left: in bed  OT Visit Diagnosis: Muscle weakness (generalized) (M62.81);Pain Pain - part of body:  (Bilateral LEs.)                Time: 6578-4696 OT Time Calculation (min): 24 min Charges:  OT General Charges $OT Visit: 1 Visit OT Evaluation $OT Eval Moderate Complexity: 1 Mod  Olegario Messier, MS, OTR/L   Olegario Messier 07/17/2023, 11:24 AM

## 2023-07-17 NOTE — Assessment & Plan Note (Signed)
07-17-2023 continue with supplemental O2.  Wean as tolerated. 07-18-2023 remains on supplemental O2. Add steroids and increase frequency of nebs. 07-19-2023 remains on 3 L/min O2. Not on home O2. Wean as tolerated.  07-20-2023 wean to RA if possible. May need home O2.

## 2023-07-17 NOTE — Assessment & Plan Note (Signed)
07-17-2023 likely due to poor po intake. Resolved with IVF. 07-19-2023 resolved. Serum bicarb 23 today.

## 2023-07-17 NOTE — Subjective & Objective (Signed)
Pt seen and examined. Still on supplemental O2 Wheezing today. Still SOB.

## 2023-07-17 NOTE — Hospital Course (Signed)
HPI: Kristen Campbell is a 78 y.o. female with medical history significant for CVA, brain aneurysm s/p VP shunt, CKD 3B, dementia and hypertension, with last hospitalization in October 2024 for altered mental status rule out for CVA with MRI, who presents to the ED with a 3-day history of cough, shortness of breath, weakness and decreased oral intake, with O2 sat 91% with EMS and no history of prior O2 requirement.  She denied chest pain. ED course and data review: Vitals within normal limits Workup notable for influenza A+ with normal WBC and negative CXR Troponin 23 and BNP 32.8 BMP notable for creatinine of 2, up from baseline of 1.78 for her CKD with bicarb of 19 EKG, personally viewed and interpreted showing NSR at 86 with no concerning ischemic ST-T wave changes Chest x-ray with no active cardiopulmonary disease Patient treated with a DuoNeb and given an NS 1 L bolus Hospitalist consulted for admission for influenza A with acute respiratory failure.  Tamiflu not started as patient on the outside 72-hour window.  Significant Events: Admitted 07/14/2023 for influenza A and acute respiratory failure with hypoxia   Significant Labs: WBC 4.2, HgB 12.7, plt 185 Na 136, K 4.9, Bicarb 19, BUN 50, Scr 2.75 Influenza A POSITIVE BNP 32.8  Significant Imaging Studies: CXR shows No active cardiopulmonary disease.   Antibiotic Therapy: Anti-infectives (From admission, onward)    Start     Dose/Rate Route Frequency Ordered Stop   07/15/23 1000  oseltamivir (TAMIFLU) capsule 30 mg        30 mg Oral Daily 07/15/23 0859 07/20/23 0959       Procedures:   Consultants:

## 2023-07-18 DIAGNOSIS — N179 Acute kidney failure, unspecified: Secondary | ICD-10-CM | POA: Diagnosis not present

## 2023-07-18 DIAGNOSIS — J101 Influenza due to other identified influenza virus with other respiratory manifestations: Secondary | ICD-10-CM | POA: Diagnosis not present

## 2023-07-18 DIAGNOSIS — E8721 Acute metabolic acidosis: Secondary | ICD-10-CM | POA: Diagnosis not present

## 2023-07-18 DIAGNOSIS — J9601 Acute respiratory failure with hypoxia: Secondary | ICD-10-CM | POA: Diagnosis not present

## 2023-07-18 LAB — CBC WITH DIFFERENTIAL/PLATELET
Abs Immature Granulocytes: 0.03 10*3/uL (ref 0.00–0.07)
Basophils Absolute: 0 10*3/uL (ref 0.0–0.1)
Basophils Relative: 0 %
Eosinophils Absolute: 0.1 10*3/uL (ref 0.0–0.5)
Eosinophils Relative: 2 %
HCT: 30.2 % — ABNORMAL LOW (ref 36.0–46.0)
Hemoglobin: 9.4 g/dL — ABNORMAL LOW (ref 12.0–15.0)
Immature Granulocytes: 1 %
Lymphocytes Relative: 33 %
Lymphs Abs: 1.5 10*3/uL (ref 0.7–4.0)
MCH: 28.2 pg (ref 26.0–34.0)
MCHC: 31.1 g/dL (ref 30.0–36.0)
MCV: 90.7 fL (ref 80.0–100.0)
Monocytes Absolute: 0.3 10*3/uL (ref 0.1–1.0)
Monocytes Relative: 7 %
Neutro Abs: 2.6 10*3/uL (ref 1.7–7.7)
Neutrophils Relative %: 57 %
Platelets: 154 10*3/uL (ref 150–400)
RBC: 3.33 MIL/uL — ABNORMAL LOW (ref 3.87–5.11)
RDW: 14.9 % (ref 11.5–15.5)
WBC: 4.4 10*3/uL (ref 4.0–10.5)
nRBC: 0 % (ref 0.0–0.2)

## 2023-07-18 LAB — COMPREHENSIVE METABOLIC PANEL
ALT: 13 U/L (ref 0–44)
AST: 16 U/L (ref 15–41)
Albumin: 3.4 g/dL — ABNORMAL LOW (ref 3.5–5.0)
Alkaline Phosphatase: 101 U/L (ref 38–126)
Anion gap: 10 (ref 5–15)
BUN: 38 mg/dL — ABNORMAL HIGH (ref 8–23)
CO2: 24 mmol/L (ref 22–32)
Calcium: 8.5 mg/dL — ABNORMAL LOW (ref 8.9–10.3)
Chloride: 105 mmol/L (ref 98–111)
Creatinine, Ser: 1.7 mg/dL — ABNORMAL HIGH (ref 0.44–1.00)
GFR, Estimated: 31 mL/min — ABNORMAL LOW (ref >60)
Glucose, Bld: 89 mg/dL (ref 70–99)
Potassium: 4.1 mmol/L (ref 3.5–5.1)
Sodium: 139 mmol/L (ref 135–145)
Total Bilirubin: 0.3 mg/dL (ref 0.0–1.2)
Total Protein: 6.3 g/dL — ABNORMAL LOW (ref 6.5–8.1)

## 2023-07-18 LAB — MAGNESIUM: Magnesium: 1.9 mg/dL (ref 1.7–2.4)

## 2023-07-18 MED ORDER — IPRATROPIUM-ALBUTEROL 0.5-2.5 (3) MG/3ML IN SOLN
3.0000 mL | Freq: Two times a day (BID) | RESPIRATORY_TRACT | Status: DC
  Administered 2023-07-18 – 2023-07-21 (×6): 3 mL via RESPIRATORY_TRACT
  Filled 2023-07-18 (×6): qty 3

## 2023-07-18 MED ORDER — METHYLPREDNISOLONE SODIUM SUCC 125 MG IJ SOLR
125.0000 mg | Freq: Once | INTRAMUSCULAR | Status: AC
  Administered 2023-07-18: 125 mg via INTRAVENOUS
  Filled 2023-07-18: qty 2

## 2023-07-18 MED ORDER — ALBUTEROL SULFATE (2.5 MG/3ML) 0.083% IN NEBU
2.5000 mg | INHALATION_SOLUTION | RESPIRATORY_TRACT | Status: DC | PRN
  Administered 2023-07-18: 2.5 mg via RESPIRATORY_TRACT
  Filled 2023-07-18: qty 3

## 2023-07-18 MED ORDER — PREDNISONE 20 MG PO TABS
40.0000 mg | ORAL_TABLET | Freq: Every day | ORAL | Status: DC
Start: 2023-07-19 — End: 2023-07-21
  Administered 2023-07-19 – 2023-07-21 (×3): 40 mg via ORAL
  Filled 2023-07-18 (×3): qty 2

## 2023-07-18 MED ORDER — IPRATROPIUM-ALBUTEROL 0.5-2.5 (3) MG/3ML IN SOLN: 3.0000 mL | Freq: Four times a day (QID) | RESPIRATORY_TRACT | Status: DC

## 2023-07-18 NOTE — Progress Notes (Addendum)
PROGRESS NOTE    Brennen Forrer  ZOX:[id number] DOB: 1946-05-15 DOA: 07/14/2023 PCP: Emi Belfast, FNP  Subjective: Pt seen and examined. Still on supplemental O2 Wheezing today. Still SOB.   Hospital Course: HPI: Joceline Castrellon is a 78 y.o. female with medical history significant for CVA, brain aneurysm s/p VP shunt, CKD 3B, dementia and hypertension, with last hospitalization in October 2024 for altered mental status rule out for CVA with MRI, who presents to the ED with a 3-day history of cough, shortness of breath, weakness and decreased oral intake, with O2 sat 91% with EMS and no history of prior O2 requirement.  She denied chest pain. ED course and data review: Vitals within normal limits Workup notable for influenza A+ with normal WBC and negative CXR Troponin 23 and BNP 32.8 BMP notable for creatinine of 2, up from baseline of 1.78 for her CKD with bicarb of 19 EKG, personally viewed and interpreted showing NSR at 86 with no concerning ischemic ST-T wave changes Chest x-ray with no active cardiopulmonary disease Patient treated with a DuoNeb and given an NS 1 L bolus Hospitalist consulted for admission for influenza A with acute respiratory failure.  Tamiflu not started as patient on the outside 72-hour window.  Significant Events: Admitted 07/14/2023 for influenza A and acute respiratory failure with hypoxia   Significant Labs: WBC 4.2, HgB 12.7, plt 185 Na 136, K 4.9, Bicarb 19, BUN 50, Scr 2.75 Influenza A POSITIVE BNP 32.8  Significant Imaging Studies: CXR shows No active cardiopulmonary disease.   Antibiotic Therapy: Anti-infectives (From admission, onward)    Start     Dose/Rate Route Frequency Ordered Stop   07/15/23 1000  oseltamivir (TAMIFLU) capsule 30 mg        30 mg Oral Daily 07/15/23 0859 07/20/23 0959       Procedures:   Consultants:     Assessment and Plan: * Influenza A On admission, Supplemental O2 to keep sats over 94.  DuoNebs as needed. Antitussives, flutter valve, incentive spirometer. Contact precautions. Supportive care-Tamiflu not ordered as patient outside 72-hour window of onset of symptoms, so low therapeutic benefit. 07-17-2023 continue with supportive care. Outside window of Tamiflu. PT consult.  07-18-2023 start steroids, increase needs to QID.  Acute metabolic acidosis 07-17-2023 likely due to poor po intake. Resolved with IVF.  Acute respiratory failure with hypoxia (HCC) 07-17-2023 continue with supplemental O2.  Wean as tolerated.  07-18-2023 remains on supplemental O2. Add steroids and increase frequency of nebs.  Acute renal failure superimposed on stage 3b chronic kidney disease (HCC) On admission, Likely prerenal secondary to dehydration from decreased oral intake. Received a 1 L fluid bolus in the ED. Monitor renal function, avoid nephrotoxins, continue to encourage oral rehydration. 07-17-2023 stable. Baseline Scr 1.7-1.9. hold lasix.  07-18-2023 Scr 1.7 stable. Continue to hold lasix.   Mixed Alzheimer and vascular dementia (HCC) 07-17-2023 stable. Continue quetiapine, duloxetine with Ativan as needed. Delirium precautions  07-18-2023 stable.  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 Last shunt revision 2016. Last seen by neurosurgery 2019   History of esophageal ulcer 07-17-2023 stable. Monitor for reflux symptoms  History of CVA (cerebrovascular accident) On admission, No acute issues suspected. Continue home meds  07-17-2023 stable.  Essential hypertension 07-17-2023 stable.  07-18-2023 stable BP   DVT prophylaxis: enoxaparin (LOVENOX) injection 30 mg Start: 07/17/23 2200    Code Status: Full Code Family Communication: no family at bedside Disposition Plan: return to ALF Reason  for continuing need for hospitalization: give IV steroids today, po prednisone tomorrow. Increase neb frequency to  QID.  Objective: Vitals:   07/17/23 1922 07/18/23 0333 07/18/23 0811 07/18/23 0830  BP: (!) 146/61 (!) 131/53 (!) 131/56   Pulse: 75 77    Resp: 16 16    Temp: 98.6 F (37 C) (!) 97.5 F (36.4 C) 97.8 F (36.6 C)   TempSrc: Oral Oral Oral   SpO2: 99% 98% 98% 96%  Weight:      Height:        Intake/Output Summary (Last 24 hours) at 07/18/2023 1254 Last data filed at 07/18/2023 0502 Gross per 24 hour  Intake 0 ml  Output 900 ml  Net -900 ml   Filed Weights   07/15/23 0046  Weight: 72.3 kg    Examination:  Physical Exam Vitals and nursing note reviewed.  Constitutional:      General: She is not in acute distress.    Appearance: She is not toxic-appearing or diaphoretic.  HENT:     Nose: Nose normal.  Eyes:     General: No scleral icterus. Cardiovascular:     Rate and Rhythm: Normal rate and regular rhythm.  Pulmonary:     Breath sounds: Wheezing present.     Comments: Still wheezing diffusely Remains on supplemental O2 Abdominal:     General: There is no distension.     Palpations: Abdomen is soft.  Musculoskeletal:     Right lower leg: No edema.     Left lower leg: No edema.  Skin:    General: Skin is warm and dry.     Capillary Refill: Capillary refill takes less than 2 seconds.  Neurological:     General: No focal deficit present.     Mental Status: She is alert and oriented to person, place, and time.    Data Reviewed: I have personally reviewed following labs and imaging studies  CBC: Recent Labs  Lab 07/14/23 1517 07/15/23 0306 07/16/23 0907 07/17/23 0458 07/18/23 0358  WBC 4.2 2.9* 2.6* 2.4* 4.4  NEUTROABS  --   --   --   --  2.6  HGB 12.7 9.5* 9.6* 8.6* 9.4*  HCT 42.6 30.8* 31.0* 27.1* 30.2*  MCV 94.2 91.7 90.4 89.1 90.7  PLT 185 157 137* 136* 154   Basic Metabolic Panel: Recent Labs  Lab 07/14/23 1517 07/15/23 0306 07/16/23 0907 07/17/23 0458 07/18/23 0358  NA 136 139 142 141 139  K 4.9 4.6 4.2 4.2 4.1  CL 105 108 109 108 105   CO2 19* 20* 21* 24 24  GLUCOSE 123* 97 104* 93 89  BUN 50* 52* 36* 35* 38*  CREATININE 2.75* 2.57* 1.60* 1.69* 1.70*  CALCIUM 8.8* 8.4* 8.7* 8.4* 8.5*  MG  --   --   --   --  1.9   GFR: Estimated Creatinine Clearance: 25.4 mL/min (A) (by C-G formula based on SCr of 1.7 mg/dL (H)). Liver Function Tests: Recent Labs  Lab 07/18/23 0358  AST 16  ALT 13  ALKPHOS 101  BILITOT 0.3  PROT 6.3*  ALBUMIN 3.4*   BNP (last 3 results) Recent Labs    07/14/23 1517  BNP 32.8   Sepsis Labs: Recent Labs  Lab 07/15/23 0629 07/15/23 0925  LATICACIDVEN 0.7 0.8   Recent Results (from the past 240 hours)  Resp panel by RT-PCR (RSV, Flu A&B, Covid) Anterior Nasal Swab     Status: Abnormal   Collection Time: 07/14/23  3:17 PM  Specimen: Anterior Nasal Swab  Result Value Ref Range Status   SARS Coronavirus 2 by RT PCR NEGATIVE NEGATIVE Final    Comment: (NOTE) SARS-CoV-2 target nucleic acids are NOT DETECTED.  The SARS-CoV-2 RNA is generally detectable in upper respiratory specimens during the acute phase of infection. The lowest concentration of SARS-CoV-2 viral copies this assay can detect is 138 copies/mL. A negative result does not preclude SARS-Cov-2 infection and should not be used as the sole basis for treatment or other patient management decisions. A negative result may occur with  improper specimen collection/handling, submission of specimen other than nasopharyngeal swab, presence of viral mutation(s) within the areas targeted by this assay, and inadequate number of viral copies(<138 copies/mL). A negative result must be combined with clinical observations, patient history, and epidemiological information. The expected result is Negative.  Fact Sheet for Patients:  BloggerCourse.com  Fact Sheet for Healthcare Providers:  SeriousBroker.it  This test is no t yet approved or cleared by the [place] FDA and  has been  authorized for detection and/or diagnosis of SARS-CoV-2 by FDA under an Emergency Use Authorization (EUA). This EUA will remain  in effect (meaning this test can be used) for the duration of the COVID-19 declaration under Section 564(b)(1) of the Act, 21 U.S.C.section 360bbb-3(b)(1), unless the authorization is terminated  or revoked sooner.       Influenza A by PCR POSITIVE (A) NEGATIVE Final   Influenza B by PCR NEGATIVE NEGATIVE Final    Comment: (NOTE) The Xpert Xpress SARS-CoV-2/FLU/RSV plus assay is intended as an aid in the diagnosis of influenza from Nasopharyngeal swab specimens and should not be used as a sole basis for treatment. Nasal washings and aspirates are unacceptable for Xpert Xpress SARS-CoV-2/FLU/RSV testing.  Fact Sheet for Patients: BloggerCourse.com  Fact Sheet for Healthcare Providers: SeriousBroker.it  This test is not yet approved or cleared by the [place] FDA and has been authorized for detection and/or diagnosis of SARS-CoV-2 by FDA under an Emergency Use Authorization (EUA). This EUA will remain in effect (meaning this test can be used) for the duration of the COVID-19 declaration under Section 564(b)(1) of the Act, 21 U.S.C. section 360bbb-3(b)(1), unless the authorization is terminated or revoked.     Resp Syncytial Virus by PCR NEGATIVE NEGATIVE Final    Comment: (NOTE) Fact Sheet for Patients: BloggerCourse.com  Fact Sheet for Healthcare Providers: SeriousBroker.it  This test is not yet approved or cleared by the [place] FDA and has been authorized for detection and/or diagnosis of SARS-CoV-2 by FDA under an Emergency Use Authorization (EUA). This EUA will remain in effect (meaning this test can be used) for the duration of the COVID-19 declaration under Section 564(b)(1) of the Act, 21 U.S.C. section 360bbb-3(b)(1), unless the  authorization is terminated or revoked.  Performed at [facility], [address] [place], [place] [address]   Respiratory (~20 pathogens) panel by PCR     Status: Abnormal   Collection Time: 07/16/23  3:36 PM   Specimen: Nasopharyngeal Swab; Respiratory  Result Value Ref Range Status   Adenovirus NOT DETECTED NOT DETECTED Final   Coronavirus 229E NOT DETECTED NOT DETECTED Final    Comment: (NOTE) The Coronavirus on the Respiratory Panel, DOES NOT test for the novel  Coronavirus (2019 nCoV)    Coronavirus HKU1 NOT DETECTED NOT DETECTED Final   Coronavirus NL63 NOT DETECTED NOT DETECTED Final   Coronavirus OC43 NOT DETECTED NOT DETECTED Final   Metapneumovirus NOT DETECTED NOT DETECTED Final  Rhinovirus / Enterovirus NOT DETECTED NOT DETECTED Final   Influenza A H1 2009 DETECTED (A) NOT DETECTED Final   Influenza B NOT DETECTED NOT DETECTED Final   Parainfluenza Virus 1 NOT DETECTED NOT DETECTED Final   Parainfluenza Virus 2 NOT DETECTED NOT DETECTED Final   Parainfluenza Virus 3 NOT DETECTED NOT DETECTED Final   Parainfluenza Virus 4 NOT DETECTED NOT DETECTED Final   Respiratory Syncytial Virus NOT DETECTED NOT DETECTED Final   Bordetella pertussis NOT DETECTED NOT DETECTED Final   Bordetella Parapertussis NOT DETECTED NOT DETECTED Final   Chlamydophila pneumoniae NOT DETECTED NOT DETECTED Final   Mycoplasma pneumoniae NOT DETECTED NOT DETECTED Final    Comment: Performed at [facility], [address], [place], [place] [address]     Radiology Studies: No results found.  Scheduled Meds:  DULoxetine  60 mg Oral Daily   enoxaparin (LOVENOX) injection  30 mg Subcutaneous Q24H   ipratropium-albuterol  3 mL Nebulization QID   methylPREDNISolone (SOLU-MEDROL) injection  125 mg Intravenous Once   Followed by   Melene Muller ON 07/19/2023] predniSONE  40 mg Oral Q breakfast   oseltamivir  30 mg Oral Daily   pantoprazole  40 mg Oral Daily   pregabalin  50 mg Oral TID    QUEtiapine  12.5 mg Oral BID   Continuous Infusions:   LOS: 4 days   Time spent: 40 minutes  Carollee Herter, DO  [facility]  07/18/2023, 12:54 PM

## 2023-07-18 NOTE — Plan of Care (Signed)

## 2023-07-18 NOTE — TOC Initial Note (Signed)
Transition of Care [facility]) - Initial/Assessment Note    Patient Details  Name: Kristen Campbell MRN: [id number] Date of Birth: 12-Nov-1945  [facility]) CM/SW Contact:    Liliana Cline, LCSW Phone Number: 07/18/2023, 12:47 PM  Clinical Narrative:                 CSW spoke with patient's daughter Darl Pikes. Darl Pikes confirmed patient is from [facility] and plan to return when medically ready. CSW reached out to [place] at [employer] 2/1 - awaiting response. Darl Pikes confirmed patient is active with HHPT at [facility]. Also confirmed with Shelia from [facility]. Darl Pikes states patient has a wheelchair, walker, and hospital bed. Darl Pikes requested call from bedside RN - notified RN of request.  Expected Discharge Plan: Assisted Living Barriers to Discharge: Continued Medical Work up   Patient Goals and CMS Choice   CMS Medicare.gov Compare Post Acute Care list provided to:: Patient Represenative (must comment) Choice offered to / list presented to : Adult Children      Expected Discharge Plan and Services       Living arrangements for the past 2 months: Assisted Living Facility                                      Prior Living Arrangements/Services Living arrangements for the past 2 months: Assisted Living Facility Lives with:: Facility Resident Patient language and need for interpreter reviewed:: Yes        Need for Family Participation in Patient Care: Yes (Comment) Care giver support system in place?: Yes (comment) Current home services: DME, Home PT Criminal Activity/Legal Involvement Pertinent to Current Situation/Hospitalization: No - Comment as needed  Activities of Daily Living   ADL Screening (condition at time of admission) Independently performs ADLs?: Yes (appropriate for developmental age) Is the patient deaf or have difficulty hearing?: No Does the patient have difficulty seeing, even when wearing glasses/contacts?: No Does the patient have difficulty  concentrating, remembering, or making decisions?: No  Permission Sought/Granted                  Emotional Assessment         Alcohol / Substance Use: Not Applicable Psych Involvement: No (comment)  Admission diagnosis:  Hypoxia [R09.02] Acute respiratory failure with hypoxia (HCC) [J96.01] AKI (acute kidney injury) (HCC) [N17.9] Influenza [J11.1] Patient Active Problem List   Diagnosis Date Noted   Acute metabolic acidosis 07/17/2023   Acute respiratory failure with hypoxia (HCC) 07/14/2023   Influenza A 07/14/2023   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   Cerebrovascular disease    Acute renal failure superimposed on stage 3b chronic kidney disease (HCC) 01/18/2020   Bilateral leg edema 04/07/2019   Essential hypertension 03/12/2019   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 [place] [address] Phone: [phone] Fax: [phone]     Social Drivers of Health (SDOH) Social History: SDOH Screenings   Food Insecurity: No Food Insecurity (07/15/2023)  Housing: Low Risk  (07/15/2023)  Transportation Needs: No Transportation Needs (07/15/2023)  Utilities: Not At Risk (07/15/2023)  Depression (PHQ2-9): Medium Risk (04/15/2020)  Social Connections: Socially Integrated (07/15/2023)  Tobacco Use: Low Risk  (07/15/2023)   SDOH Interventions:     Readmission Risk Interventions     No data to display

## 2023-07-18 NOTE — Progress Notes (Signed)
   Called and discussed pt's care with pt's dtr susan whitesell.  Carollee Herter, DO [facility]

## 2023-07-19 DIAGNOSIS — J9601 Acute respiratory failure with hypoxia: Secondary | ICD-10-CM | POA: Diagnosis not present

## 2023-07-19 DIAGNOSIS — E8721 Acute metabolic acidosis: Secondary | ICD-10-CM | POA: Diagnosis not present

## 2023-07-19 DIAGNOSIS — J101 Influenza due to other identified influenza virus with other respiratory manifestations: Secondary | ICD-10-CM | POA: Diagnosis not present

## 2023-07-19 DIAGNOSIS — N179 Acute kidney failure, unspecified: Secondary | ICD-10-CM | POA: Diagnosis not present

## 2023-07-19 LAB — COMPREHENSIVE METABOLIC PANEL
ALT: 15 U/L (ref 0–44)
AST: 18 U/L (ref 15–41)
Albumin: 3.3 g/dL — ABNORMAL LOW (ref 3.5–5.0)
Alkaline Phosphatase: 104 U/L (ref 38–126)
Anion gap: 13 (ref 5–15)
BUN: 40 mg/dL — ABNORMAL HIGH (ref 8–23)
CO2: 23 mmol/L (ref 22–32)
Calcium: 9.1 mg/dL (ref 8.9–10.3)
Chloride: 103 mmol/L (ref 98–111)
Creatinine, Ser: 1.61 mg/dL — ABNORMAL HIGH (ref 0.44–1.00)
GFR, Estimated: 33 mL/min — ABNORMAL LOW (ref >60)
Glucose, Bld: 174 mg/dL — ABNORMAL HIGH (ref 70–99)
Potassium: 4.6 mmol/L (ref 3.5–5.1)
Sodium: 139 mmol/L (ref 135–145)
Total Bilirubin: 0.7 mg/dL (ref 0.0–1.2)
Total Protein: 6.9 g/dL (ref 6.5–8.1)

## 2023-07-19 LAB — CBC WITH DIFFERENTIAL/PLATELET
Abs Immature Granulocytes: 0.11 10*3/uL — ABNORMAL HIGH (ref 0.00–0.07)
Basophils Absolute: 0 10*3/uL (ref 0.0–0.1)
Basophils Relative: 0 %
Eosinophils Absolute: 0 10*3/uL (ref 0.0–0.5)
Eosinophils Relative: 0 %
HCT: 30.1 % — ABNORMAL LOW (ref 36.0–46.0)
Hemoglobin: 9.5 g/dL — ABNORMAL LOW (ref 12.0–15.0)
Immature Granulocytes: 4 %
Lymphocytes Relative: 13 %
Lymphs Abs: 0.4 10*3/uL — ABNORMAL LOW (ref 0.7–4.0)
MCH: 27.9 pg (ref 26.0–34.0)
MCHC: 31.6 g/dL (ref 30.0–36.0)
MCV: 88.5 fL (ref 80.0–100.0)
Monocytes Absolute: 0.1 10*3/uL (ref 0.1–1.0)
Monocytes Relative: 2 %
Neutro Abs: 2.3 10*3/uL (ref 1.7–7.7)
Neutrophils Relative %: 81 %
Platelets: 178 10*3/uL (ref 150–400)
RBC: 3.4 MIL/uL — ABNORMAL LOW (ref 3.87–5.11)
RDW: 14.6 % (ref 11.5–15.5)
WBC: 2.9 10*3/uL — ABNORMAL LOW (ref 4.0–10.5)
nRBC: 0 % (ref 0.0–0.2)

## 2023-07-19 NOTE — Progress Notes (Signed)
PROGRESS NOTE    Kristen Campbell  ZOX:[id number] DOB: [birth year] DOA: 07/14/2023 PCP: Emi Belfast, FNP  Subjective: Pt seen and examined. Remains on supplemental O2 3 L/min. Pt not on home O2 Still Wheezing today. Still feel SOB and lousy.   Hospital Course: HPI: Kristen Campbell is a 78 y.o. female with medical history significant for CVA, brain aneurysm s/p VP shunt, CKD 3B, dementia and hypertension, with last hospitalization in October 2024 for altered mental status rule out for CVA with MRI, who presents to the ED with a 3-day history of cough, shortness of breath, weakness and decreased oral intake, with O2 sat 91% with EMS and no history of prior O2 requirement.  She denied chest pain. ED course and data review: Vitals within normal limits Workup notable for influenza A+ with normal WBC and negative CXR Troponin 23 and BNP 32.8 BMP notable for creatinine of 2, up from baseline of 1.78 for her CKD with bicarb of 19 EKG, personally viewed and interpreted showing NSR at 86 with no concerning ischemic ST-T wave changes Chest x-ray with no active cardiopulmonary disease Patient treated with a DuoNeb and given an NS 1 L bolus Hospitalist consulted for admission for influenza A with acute respiratory failure.  Tamiflu not started as patient on the outside 72-hour window.  Significant Events: Admitted 07/14/2023 for influenza A and acute respiratory failure with hypoxia   Significant Labs: WBC 4.2, HgB 12.7, plt 185 Na 136, K 4.9, Bicarb 19, BUN 50, Scr 2.75 Influenza A POSITIVE BNP 32.8  Significant Imaging Studies: CXR shows No active cardiopulmonary disease.   Antibiotic Therapy: Anti-infectives (From admission, onward)    Start     Dose/Rate Route Frequency Ordered Stop   07/15/23 1000  oseltamivir (TAMIFLU) capsule 30 mg        30 mg Oral Daily 07/15/23 0859 07/20/23 0959       Procedures:   Consultants:     Assessment and Plan: * Influenza A On  admission, Supplemental O2 to keep sats over 94. DuoNebs as needed. Antitussives, flutter valve, incentive spirometer. Contact precautions. Supportive care-Tamiflu not ordered as patient outside 72-hour window of onset of symptoms, so low therapeutic benefit. 07-17-2023 continue with supportive care. Outside window of Tamiflu. PT consult. 07-18-2023 start steroids, increase needs to QID.  07-19-2023 continue prednisone 40 mg daily and QID nebs. Continue supportive care.   Acute metabolic acidosis 07-17-2023 likely due to poor po intake. Resolved with IVF.  07-19-2023 resolved. Serum bicarb 23 today.  Acute respiratory failure with hypoxia (HCC) 07-17-2023 continue with supplemental O2.  Wean as tolerated. 07-18-2023 remains on supplemental O2. Add steroids and increase frequency of nebs.  07-19-2023 remains on 3 L/min O2. Not on home O2. Wean as tolerated.  Acute renal failure superimposed on stage 3b chronic kidney disease (HCC)  - Baseline Scr 1.7-1.9 On admission, Likely prerenal secondary to dehydration from decreased oral intake. Received a 1 L fluid bolus in the ED. Monitor renal function, avoid nephrotoxins, continue to encourage oral rehydration. 07-17-2023 stable. Baseline Scr 1.7-1.9. hold lasix. 07-18-2023 Scr 1.7 stable. Continue to hold lasix.   07-19-2023 SCR 1.61, BUN 40. Stable.   Mixed Alzheimer and vascular dementia (HCC) 07-17-2023 stable. Continue quetiapine, duloxetine with Ativan as needed. Delirium precautions 07-18-2023 stable.  07-19-2023 stable.  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 Last shunt revision 2016. Last seen by neurosurgery 2019   07-19-2023 stable.  History of esophageal ulcer 07-17-2023 stable. Monitor  for reflux symptoms  07-19-2023 stable.  History of CVA (cerebrovascular accident) On admission, No acute issues suspected. Continue home meds 07-17-2023 stable.  07-19-2023  stable.  Essential hypertension 07-17-2023 stable. 07-18-2023 stable BP  07-19-2023 stable.       DVT prophylaxis: enoxaparin (LOVENOX) injection 30 mg Start: 07/17/23 2200    Code Status: Full Code Family Communication: no family at bedside. Spoke to pt's dtr Kristen Campbell yesterday evening via phone. Disposition Plan: return to ALF Reason for continuing need for hospitalization: remains on supplemental O2.  Objective: Vitals:   07/18/23 1500 07/18/23 1936 07/19/23 0400 07/19/23 1029  BP: 133/60 (!) 143/61 (!) 140/67 (!) 145/98  Pulse: 78 96  100  Resp: 20 20  20   Temp: 98.9 F (37.2 C) 98.3 F (36.8 C) 98.1 F (36.7 C) 98.2 F (36.8 C)  TempSrc: Oral Oral Oral   SpO2: 96% 93% 94% 97%  Weight:      Height:        Intake/Output Summary (Last 24 hours) at 07/19/2023 1232 Last data filed at 07/18/2023 1300 Gross per 24 hour  Intake 0 ml  Output --  Net 0 ml   Filed Weights   07/15/23 0046  Weight: 72.3 kg    Examination:  Physical Exam Vitals and nursing note reviewed.  Constitutional:      General: She is not in acute distress.    Appearance: She is not toxic-appearing or diaphoretic.     Comments: Looks like she is still feeling ill  HENT:     Head: Normocephalic and atraumatic.  Eyes:     General: No scleral icterus. Cardiovascular:     Rate and Rhythm: Normal rate and regular rhythm.     Pulses: Normal pulses.  Pulmonary:     Comments: Slight end expiratory wheeze  Coarse BS bilaterally Wet cough. Abdominal:     General: Bowel sounds are normal.     Palpations: Abdomen is soft.  Musculoskeletal:     Right lower leg: No edema.     Left lower leg: No edema.  Skin:    General: Skin is warm and dry.     Capillary Refill: Capillary refill takes less than 2 seconds.  Neurological:     General: No focal deficit present.     Mental Status: She is alert.     Data Reviewed: I have personally reviewed following labs and imaging studies  CBC: Recent  Labs  Lab 07/15/23 0306 07/16/23 0907 07/17/23 0458 07/18/23 0358 07/19/23 0540  WBC 2.9* 2.6* 2.4* 4.4 2.9*  NEUTROABS  --   --   --  2.6 2.3  HGB 9.5* 9.6* 8.6* 9.4* 9.5*  HCT 30.8* 31.0* 27.1* 30.2* 30.1*  MCV 91.7 90.4 89.1 90.7 88.5  PLT 157 137* 136* 154 178   Basic Metabolic Panel: Recent Labs  Lab 07/15/23 0306 07/16/23 0907 07/17/23 0458 07/18/23 0358 07/19/23 0540  NA 139 142 141 139 139  K 4.6 4.2 4.2 4.1 4.6  CL 108 109 108 105 103  CO2 20* 21* 24 24 23   GLUCOSE 97 104* 93 89 174*  BUN 52* 36* 35* 38* 40*  CREATININE 2.57* 1.60* 1.69* 1.70* 1.61*  CALCIUM 8.4* 8.7* 8.4* 8.5* 9.1  MG  --   --   --  1.9  --    GFR: Estimated Creatinine Clearance: 26.8 mL/min (A) (by C-G formula based on SCr of 1.61 mg/dL (H)). Liver Function Tests: Recent Labs  Lab 07/18/23 0358 07/19/23 0540  AST  16 18  ALT 13 15  ALKPHOS 101 104  BILITOT 0.3 0.7  PROT 6.3* 6.9  ALBUMIN 3.4* 3.3*   BNP (last 3 results) Recent Labs    07/14/23 1517  BNP 32.8   Sepsis Labs: Recent Labs  Lab 07/15/23 0629 07/15/23 0925  LATICACIDVEN 0.7 0.8    Recent Results (from the past 240 hours)  Resp panel by RT-PCR (RSV, Flu A&B, Covid) Anterior Nasal Swab     Status: Abnormal   Collection Time: 07/14/23  3:17 PM   Specimen: Anterior Nasal Swab  Result Value Ref Range Status   SARS Coronavirus 2 by RT PCR NEGATIVE NEGATIVE Final    Comment: (NOTE) SARS-CoV-2 target nucleic acids are NOT DETECTED.  The SARS-CoV-2 RNA is generally detectable in upper respiratory specimens during the acute phase of infection. The lowest concentration of SARS-CoV-2 viral copies this assay can detect is 138 copies/mL. A negative result does not preclude SARS-Cov-2 infection and should not be used as the sole basis for treatment or other patient management decisions. A negative result may occur with  improper specimen collection/handling, submission of specimen other than nasopharyngeal swab,  presence of viral mutation(s) within the areas targeted by this assay, and inadequate number of viral copies(<138 copies/mL). A negative result must be combined with clinical observations, patient history, and epidemiological information. The expected result is Negative.  Fact Sheet for Patients:  BloggerCourse.com  Fact Sheet for Healthcare Providers:  SeriousBroker.it  This test is no t yet approved or cleared by the [place] FDA and  has been authorized for detection and/or diagnosis of SARS-CoV-2 by FDA under an Emergency Use Authorization (EUA). This EUA will remain  in effect (meaning this test can be used) for the duration of the COVID-19 declaration under Section 564(b)(1) of the Act, 21 U.S.C.section 360bbb-3(b)(1), unless the authorization is terminated  or revoked sooner.       Influenza A by PCR POSITIVE (A) NEGATIVE Final   Influenza B by PCR NEGATIVE NEGATIVE Final    Comment: (NOTE) The Xpert Xpress SARS-CoV-2/FLU/RSV plus assay is intended as an aid in the diagnosis of influenza from Nasopharyngeal swab specimens and should not be used as a sole basis for treatment. Nasal washings and aspirates are unacceptable for Xpert Xpress SARS-CoV-2/FLU/RSV testing.  Fact Sheet for Patients: BloggerCourse.com  Fact Sheet for Healthcare Providers: SeriousBroker.it  This test is not yet approved or cleared by the [place] FDA and has been authorized for detection and/or diagnosis of SARS-CoV-2 by FDA under an Emergency Use Authorization (EUA). This EUA will remain in effect (meaning this test can be used) for the duration of the COVID-19 declaration under Section 564(b)(1) of the Act, 21 U.S.C. section 360bbb-3(b)(1), unless the authorization is terminated or revoked.     Resp Syncytial Virus by PCR NEGATIVE NEGATIVE Final    Comment: (NOTE) Fact Sheet for  Patients: BloggerCourse.com  Fact Sheet for Healthcare Providers: SeriousBroker.it  This test is not yet approved or cleared by the [place] FDA and has been authorized for detection and/or diagnosis of SARS-CoV-2 by FDA under an Emergency Use Authorization (EUA). This EUA will remain in effect (meaning this test can be used) for the duration of the COVID-19 declaration under Section 564(b)(1) of the Act, 21 U.S.C. section 360bbb-3(b)(1), unless the authorization is terminated or revoked.  Performed at [facility], [address] [place], [place] [address]   Respiratory (~20 pathogens) panel by PCR     Status: Abnormal  Collection Time: 07/16/23  3:36 PM   Specimen: Nasopharyngeal Swab; Respiratory  Result Value Ref Range Status   Adenovirus NOT DETECTED NOT DETECTED Final   Coronavirus 229E NOT DETECTED NOT DETECTED Final    Comment: (NOTE) The Coronavirus on the Respiratory Panel, DOES NOT test for the novel  Coronavirus (2019 nCoV)    Coronavirus HKU1 NOT DETECTED NOT DETECTED Final   Coronavirus NL63 NOT DETECTED NOT DETECTED Final   Coronavirus OC43 NOT DETECTED NOT DETECTED Final   Metapneumovirus NOT DETECTED NOT DETECTED Final   Rhinovirus / Enterovirus NOT DETECTED NOT DETECTED Final   Influenza A H1 2009 DETECTED (A) NOT DETECTED Final   Influenza B NOT DETECTED NOT DETECTED Final   Parainfluenza Virus 1 NOT DETECTED NOT DETECTED Final   Parainfluenza Virus 2 NOT DETECTED NOT DETECTED Final   Parainfluenza Virus 3 NOT DETECTED NOT DETECTED Final   Parainfluenza Virus 4 NOT DETECTED NOT DETECTED Final   Respiratory Syncytial Virus NOT DETECTED NOT DETECTED Final   Bordetella pertussis NOT DETECTED NOT DETECTED Final   Bordetella Parapertussis NOT DETECTED NOT DETECTED Final   Chlamydophila pneumoniae NOT DETECTED NOT DETECTED Final   Mycoplasma pneumoniae NOT DETECTED NOT DETECTED Final    Comment:  Performed at [facility], [address] [place], [place] [address]     Radiology Studies: No results found.  Scheduled Meds:  DULoxetine  60 mg Oral Daily   enoxaparin (LOVENOX) injection  30 mg Subcutaneous Q24H   ipratropium-albuterol  3 mL Nebulization BID   pantoprazole  40 mg Oral Daily   predniSONE  40 mg Oral Q breakfast   pregabalin  50 mg Oral TID   QUEtiapine  12.5 mg Oral BID   Continuous Infusions:   LOS: 5 days   Time spent: 40 minutes  Carollee Herter, DO  [facility]  07/19/2023, 12:32 PM

## 2023-07-19 NOTE — Plan of Care (Signed)

## 2023-07-19 NOTE — Plan of Care (Signed)

## 2023-07-19 NOTE — TOC Progression Note (Signed)
Transition of Care [facility]) - Progression Note    Patient Details  Name: Kristen Campbell MRN: [id number] Date of Birth: [birth year]  [facility] CM/SW Contact  Chapman Fitch, RN Phone Number: 07/19/2023, 9:41 AM  Clinical Narrative:     Per MD anticipated DC 2-3 days.  Patient still requiring O2.  [facility] at the [facility] updated   Expected Discharge Plan: Assisted Living Barriers to Discharge: Continued Medical Work up  Expected Discharge Plan and Services       Living arrangements for the past 2 months: Assisted Living Facility                                       Social Determinants of Health (SDOH) Interventions SDOH Screenings   Food Insecurity: No Food Insecurity (07/15/2023)  Housing: Low Risk  (07/15/2023)  Transportation Needs: No Transportation Needs (07/15/2023)  Utilities: Not At Risk (07/15/2023)  Depression (PHQ2-9): Medium Risk (04/15/2020)  Social Connections: Socially Integrated (07/15/2023)  Tobacco Use: Low Risk  (07/15/2023)    Readmission Risk Interventions     No data to display

## 2023-07-19 NOTE — Progress Notes (Signed)
PT Cancellation Note  Patient Details Name: Kristen Campbell MRN: [id number] DOB: [birth year]   Cancelled Treatment:    Reason Eval/Treat Not Completed: Other (comment).  Pt declined physical therapy this morning (0848) and this afternoon (1406) d/t not feeling well in general--nursed notified.  Will re-attempt PT session at a later date/time.  Hendricks Limes, PT 07/19/23, 2:11 PM

## 2023-07-20 DIAGNOSIS — G309 Alzheimer's disease, unspecified: Secondary | ICD-10-CM | POA: Diagnosis not present

## 2023-07-20 DIAGNOSIS — I10 Essential (primary) hypertension: Secondary | ICD-10-CM | POA: Diagnosis not present

## 2023-07-20 DIAGNOSIS — J101 Influenza due to other identified influenza virus with other respiratory manifestations: Secondary | ICD-10-CM | POA: Diagnosis not present

## 2023-07-20 DIAGNOSIS — J9601 Acute respiratory failure with hypoxia: Secondary | ICD-10-CM | POA: Diagnosis not present

## 2023-07-20 MED ORDER — AMLODIPINE BESYLATE 5 MG PO TABS
5.0000 mg | ORAL_TABLET | Freq: Every day | ORAL | Status: DC
  Administered 2023-07-20 – 2023-07-21 (×2): 5 mg via ORAL
  Filled 2023-07-20 (×2): qty 1

## 2023-07-20 NOTE — Progress Notes (Signed)
 PROGRESS NOTE    Kristen Campbell  FMW:[id number] DOB: 1945/08/25 DOA: 07/14/2023 PCP: Avram Barnie NOVAK, FNP  Subjective: Pt seen and examined. Remains on 3 L/min. Pt not on home O2 Feeling slightly better today. Up in chair.   Hospital Course: HPI: Kristen Campbell is a 78 y.o. female with medical history significant for CVA, brain aneurysm s/p VP shunt, CKD 3B, dementia and hypertension, with last hospitalization in October 2024 for altered mental status rule out for CVA with MRI, who presents to the ED with a 3-day history of cough, shortness of breath, weakness and decreased oral intake, with O2 sat 91% with EMS and no history of prior O2 requirement.  She denied chest pain. ED course and data review: Vitals within normal limits Workup notable for influenza A+ with normal WBC and negative CXR Troponin 23 and BNP 32.8 BMP notable for creatinine of 2, up from baseline of 1.78 for her CKD with bicarb of 19 EKG, personally viewed and interpreted showing NSR at 86 with no concerning ischemic ST-T wave changes Chest x-ray with no active cardiopulmonary disease Patient treated with a DuoNeb and given an NS 1 L bolus Hospitalist consulted for admission for influenza A with acute respiratory failure.  Tamiflu  not started as patient on the outside 72-hour window.  Significant Events: Admitted 07/14/2023 for influenza A and acute respiratory failure with hypoxia   Significant Labs: WBC 4.2, HgB 12.7, plt 185 Na 136, K 4.9, Bicarb 19, BUN 50, Scr 2.75 Influenza A POSITIVE BNP 32.8  Significant Imaging Studies: CXR shows No active cardiopulmonary disease.   Antibiotic Therapy: Anti-infectives (From admission, onward)    Start     Dose/Rate Route Frequency Ordered Stop   07/15/23 1000  oseltamivir  (TAMIFLU ) capsule 30 mg        30 mg Oral Daily 07/15/23 0859 07/20/23 0959       Procedures:   Consultants:     Assessment and Plan: * Influenza A On admission, Supplemental  O2 to keep sats over 94. DuoNebs as needed. Antitussives, flutter valve, incentive spirometer. Contact precautions. Supportive care-Tamiflu  not ordered as patient outside 72-hour window of onset of symptoms, so low therapeutic benefit. 07-17-2023 continue with supportive care. Outside window of Tamiflu . PT consult. 07-18-2023 start steroids, increase needs to QID. 07-19-2023 continue prednisone  40 mg daily and QID nebs. Continue supportive care.   07-20-2023 wean to RA. Continue prednisone  40 mg daily and QID nebs.  Acute metabolic acidosis 07-17-2023 likely due to poor po intake. Resolved with IVF. 07-19-2023 resolved. Serum bicarb 23 today.  Acute respiratory failure with hypoxia (HCC) 07-17-2023 continue with supplemental O2.  Wean as tolerated. 07-18-2023 remains on supplemental O2. Add steroids and increase frequency of nebs. 07-19-2023 remains on 3 L/min O2. Not on home O2. Wean as tolerated.  07-20-2023 wean to RA if possible. May need home O2.  Acute renal failure superimposed on stage 3b chronic kidney disease (HCC)  - Baseline Scr 1.7-1.9 On admission, Likely prerenal secondary to dehydration from decreased oral intake. Received a 1 L fluid bolus in the ED. Monitor renal function, avoid nephrotoxins, continue to encourage oral rehydration. 07-17-2023 stable. Baseline Scr 1.7-1.9. hold lasix . 07-18-2023 Scr 1.7 stable. Continue to hold lasix .  07-19-2023 SCR 1.61, BUN 40. Stable.  07-20-2023 stable. Lasix  on hold due to poor po intake. Would only use prn when she goes back to facility.   Mixed Alzheimer and vascular dementia (HCC) 07-17-2023 stable. Continue quetiapine , duloxetine  with Ativan  as needed. Delirium precautions 07-18-2023 stable.  07-19-2023 stable. 07-20-2023 stable.   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 Last shunt revision 2016. Last seen by neurosurgery 2019  07-19-2023 stable. 07-20-2023 stable.    History of esophageal ulcer 07-17-2023 stable. Monitor for reflux symptoms 07-19-2023 stable. 07-20-2023 stable.   History of CVA (cerebrovascular accident) On admission, No acute issues suspected. Continue home meds 07-17-2023 stable. 07-19-2023 stable. 07-20-2023 stable.  Essential hypertension 07-17-2023 stable. 07-18-2023 stable BP 07-19-2023 stable. 07-20-2023 BP started to climb. Start norvasc  5 mg daily. May be from steroids.   DVT prophylaxis: enoxaparin  (LOVENOX ) injection 30 mg Start: 07/17/23 2200    Code Status: Full Code Family Communication: called pt's dtr Kristen Campbell and gave her update. Most likely DC back to SNF tomorrow Disposition Plan: SNF Reason for continuing need for hospitalization: weaning off supplemental O2.  Objective: Vitals:   07/19/23 2000 07/19/23 2050 07/20/23 0440 07/20/23 0746  BP:  (!) 148/63 (!) 142/64 (!) 157/80  Pulse:  98 81 78  Resp:  20 20 16   Temp:  98.3 F (36.8 C) 97.8 F (36.6 C) 97.9 F (36.6 C)  TempSrc:  Oral Oral Oral  SpO2: 98% 92% 95% 100%  Weight:      Height:        Intake/Output Summary (Last 24 hours) at 07/20/2023 1151 Last data filed at 07/20/2023 0453 Gross per 24 hour  Intake 0 ml  Output 650 ml  Net -650 ml   Filed Weights   07/15/23 0046  Weight: 72.3 kg    Examination:  Physical Exam Vitals and nursing note reviewed.  Constitutional:      General: She is not in acute distress.    Appearance: She is not toxic-appearing or diaphoretic.  HENT:     Head: Normocephalic and atraumatic.     Nose: Nose normal.  Eyes:     General: No scleral icterus. Cardiovascular:     Rate and Rhythm: Normal rate and regular rhythm.  Pulmonary:     Effort: Pulmonary effort is normal.     Comments: Scattered wheezing. No distress. Abdominal:     General: Bowel sounds are normal.     Palpations: Abdomen is soft.  Musculoskeletal:     Right lower leg: No edema.     Left lower leg: No edema.  Skin:    General:  Skin is warm and dry.     Capillary Refill: Capillary refill takes less than 2 seconds.     Data Reviewed: I have personally reviewed following labs and imaging studies  CBC: Recent Labs  Lab 07/15/23 0306 07/16/23 0907 07/17/23 0458 07/18/23 0358 07/19/23 0540  WBC 2.9* 2.6* 2.4* 4.4 2.9*  NEUTROABS  --   --   --  2.6 2.3  HGB 9.5* 9.6* 8.6* 9.4* 9.5*  HCT 30.8* 31.0* 27.1* 30.2* 30.1*  MCV 91.7 90.4 89.1 90.7 88.5  PLT 157 137* 136* 154 178   Basic Metabolic Panel: Recent Labs  Lab 07/15/23 0306 07/16/23 0907 07/17/23 0458 07/18/23 0358 07/19/23 0540  NA 139 142 141 139 139  K 4.6 4.2 4.2 4.1 4.6  CL 108 109 108 105 103  CO2 20* 21* 24 24 23   GLUCOSE 97 104* 93 89 174*  BUN 52* 36* 35* 38* 40*  CREATININE 2.57* 1.60* 1.69* 1.70* 1.61*  CALCIUM  8.4* 8.7* 8.4* 8.5* 9.1  MG  --   --   --  1.9  --    GFR: Estimated Creatinine Clearance: 26.8 mL/min (A) (  by C-G formula based on SCr of 1.61 mg/dL (H)). Liver Function Tests: Recent Labs  Lab 07/18/23 0358 07/19/23 0540  AST 16 18  ALT 13 15  ALKPHOS 101 104  BILITOT 0.3 0.7  PROT 6.3* 6.9  ALBUMIN  3.4* 3.3*   BNP (last 3 results) Recent Labs    07/14/23 1517  BNP 32.8   Sepsis Labs: Recent Labs  Lab 07/15/23 0629 07/15/23 0925  LATICACIDVEN 0.7 0.8    Recent Results (from the past 240 hours)  Resp panel by RT-PCR (RSV, Flu A&B, Covid) Anterior Nasal Swab     Status: Abnormal   Collection Time: 07/14/23  3:17 PM   Specimen: Anterior Nasal Swab  Result Value Ref Range Status   SARS Coronavirus 2 by RT PCR NEGATIVE NEGATIVE Final    Comment: (NOTE) SARS-CoV-2 target nucleic acids are NOT DETECTED.  The SARS-CoV-2 RNA is generally detectable in upper respiratory specimens during the acute phase of infection. The lowest concentration of SARS-CoV-2 viral copies this assay can detect is 138 copies/mL. A negative result does not preclude SARS-Cov-2 infection and should not be used as the sole basis  for treatment or other patient management decisions. A negative result may occur with  improper specimen collection/handling, submission of specimen other than nasopharyngeal swab, presence of viral mutation(s) within the areas targeted by this assay, and inadequate number of viral copies(<138 copies/mL). A negative result must be combined with clinical observations, patient history, and epidemiological information. The expected result is Negative.  Fact Sheet for Patients:  bloggercourse.com  Fact Sheet for Healthcare Providers:  seriousbroker.it  This test is no t yet approved or cleared by the [place]  FDA and  has been authorized for detection and/or diagnosis of SARS-CoV-2 by FDA under an Emergency Use Authorization (EUA). This EUA will remain  in effect (meaning this test can be used) for the duration of the COVID-19 declaration under Section 564(b)(1) of the Act, 21 U.S.C.section 360bbb-3(b)(1), unless the authorization is terminated  or revoked sooner.       Influenza A by PCR POSITIVE (A) NEGATIVE Final   Influenza B by PCR NEGATIVE NEGATIVE Final    Comment: (NOTE) The Xpert Xpress SARS-CoV-2/FLU/RSV plus assay is intended as an aid in the diagnosis of influenza from Nasopharyngeal swab specimens and should not be used as a sole basis for treatment. Nasal washings and aspirates are unacceptable for Xpert Xpress SARS-CoV-2/FLU/RSV testing.  Fact Sheet for Patients: bloggercourse.com  Fact Sheet for Healthcare Providers: seriousbroker.it  This test is not yet approved or cleared by the [place]  FDA and has been authorized for detection and/or diagnosis of SARS-CoV-2 by FDA under an Emergency Use Authorization (EUA). This EUA will remain in effect (meaning this test can be used) for the duration of the COVID-19 declaration under Section 564(b)(1) of the Act,  21 U.S.C. section 360bbb-3(b)(1), unless the authorization is terminated or revoked.     Resp Syncytial Virus by PCR NEGATIVE NEGATIVE Final    Comment: (NOTE) Fact Sheet for Patients: bloggercourse.com  Fact Sheet for Healthcare Providers: seriousbroker.it  This test is not yet approved or cleared by the [place]  FDA and has been authorized for detection and/or diagnosis of SARS-CoV-2 by FDA under an Emergency Use Authorization (EUA). This EUA will remain in effect (meaning this test can be used) for the duration of the COVID-19 declaration under Section 564(b)(1) of the Act, 21 U.S.C. section 360bbb-3(b)(1), unless the authorization is terminated or revoked.  Performed at [facility],  [address] [place], [place] [address]   Respiratory (~20 pathogens) panel by PCR     Status: Abnormal   Collection Time: 07/16/23  3:36 PM   Specimen: Nasopharyngeal Swab; Respiratory  Result Value Ref Range Status   Adenovirus NOT DETECTED NOT DETECTED Final   Coronavirus 229E NOT DETECTED NOT DETECTED Final    Comment: (NOTE) The Coronavirus on the Respiratory Panel, DOES NOT test for the novel  Coronavirus (2019 nCoV)    Coronavirus HKU1 NOT DETECTED NOT DETECTED Final   Coronavirus NL63 NOT DETECTED NOT DETECTED Final   Coronavirus OC43 NOT DETECTED NOT DETECTED Final   Metapneumovirus NOT DETECTED NOT DETECTED Final   Rhinovirus / Enterovirus NOT DETECTED NOT DETECTED Final   Influenza A H1 2009 DETECTED (A) NOT DETECTED Final   Influenza B NOT DETECTED NOT DETECTED Final   Parainfluenza Virus 1 NOT DETECTED NOT DETECTED Final   Parainfluenza Virus 2 NOT DETECTED NOT DETECTED Final   Parainfluenza Virus 3 NOT DETECTED NOT DETECTED Final   Parainfluenza Virus 4 NOT DETECTED NOT DETECTED Final   Respiratory Syncytial Virus NOT DETECTED NOT DETECTED Final   Bordetella pertussis NOT DETECTED NOT DETECTED Final    Bordetella Parapertussis NOT DETECTED NOT DETECTED Final   Chlamydophila pneumoniae NOT DETECTED NOT DETECTED Final   Mycoplasma pneumoniae NOT DETECTED NOT DETECTED Final    Comment: Performed at [facility], [address], [place], [place] [address]     Radiology Studies: No results found.  Scheduled Meds:  amLODipine   5 mg Oral Daily   DULoxetine   60 mg Oral Daily   enoxaparin  (LOVENOX ) injection  30 mg Subcutaneous Q24H   ipratropium-albuterol   3 mL Nebulization BID   pantoprazole   40 mg Oral Daily   predniSONE   40 mg Oral Q breakfast   pregabalin   50 mg Oral TID   QUEtiapine   12.5 mg Oral BID   Continuous Infusions:   LOS: 6 days   Time spent: 40 minutes  Camellia Door, DO  [facility]  07/20/2023, 11:51 AM

## 2023-07-20 NOTE — Plan of Care (Signed)

## 2023-07-20 NOTE — Progress Notes (Signed)
 Occupational Therapy Treatment Patient Details Name: Kristen Campbell MRN: [id number] DOB: Apr 08, 1946 Today's Date: 07/20/2023   History of present illness Pt is a 78 y.o. female presenting to hospital 07/14/23 with c/o fatigue, body aches, chills, cough for past 3 days. (+) Influenza A.  Pt admitted with influenza A, acute respiratory failure with hypoxia, acute renal failure, acute metabolic acidosis.   PMH includes htn, CVA, CKD, dementia, CSF shunt, RCR, abdominal sx, cerebral aneurysm repair, abdominal sx.   OT comments  Pt seen for OT tx, in coordination with PT to honor pt's wishes for sitting up in recliner for ~67min total. Pt ready to return to bed upon OT's arrival. Pt required VC for hand placement and sequencing for squat pivot to the L requiring MOD A. MOD A for BLE mgt back to bed. Pt educated in upright positioning to support lung function and PLB to support recovery. SpO2 >90% on 3L but endorses feeling winded and 10/10 rate of perceived exertion. Pt continues to benefit from skilled OT Services to maximize return to PLOF.       If plan is discharge home, recommend the following:  A lot of help with walking and/or transfers;A lot of help with bathing/dressing/bathroom;Assist for transportation;Help with stairs or ramp for entrance   Equipment Recommendations  None recommended by OT    Recommendations for Other Services      Precautions / Restrictions Precautions Precautions: Fall Restrictions Weight Bearing Restrictions Per Provider Order: No       Mobility Bed Mobility Overal bed mobility: Needs Assistance Bed Mobility: Sit to Supine       Sit to supine: Mod assist, HOB elevated   General bed mobility comments: MOD A for BLE mgt back to bed    Transfers Overall transfer level: Needs assistance Equipment used: None Transfers: Bed to chair/wheelchair/BSC     Squat pivot transfers: Mod assist       General transfer comment: squat pivot to the L with VC for  hand placement     Balance Overall balance assessment: Needs assistance Sitting-balance support: No upper extremity supported, Feet supported Sitting balance-Leahy Scale: Good     Standing balance support: Single extremity supported, During functional activity Standing balance-Leahy Scale: Poor                             ADL either performed or assessed with clinical judgement   ADL Overall ADL's : Needs assistance/impaired                                            Extremity/Trunk Assessment              Vision       Perception     Praxis      Cognition Arousal: Alert Behavior During Therapy: WFL for tasks assessed/performed Overall Cognitive Status: No family/caregiver present to determine baseline cognitive functioning                                          Exercises Other Exercises Other Exercises: VC for PLB    Shoulder Instructions       General Comments SpO2 >90% on 3L but endorses feeling winded and 10/10 rate of perceived exertion  Pertinent Vitals/ Pain       Pain Assessment Pain Assessment: 0-10 Pain Score: 6  Pain Location: BLE Pain Descriptors / Indicators: Aching Pain Intervention(s): Monitored during session, Limited activity within patient's tolerance, Repositioned  Home Living                                          Prior Functioning/Environment              Frequency  Min 1X/week        Progress Toward Goals  OT Goals(current goals can now be found in the care plan section)  Progress towards OT goals: Progressing toward goals  Acute Rehab OT Goals Patient Stated Goal: feel better OT Goal Formulation: With patient Time For Goal Achievement: 07/31/23 Potential to Achieve Goals: Good  Plan      Co-evaluation                 AM-PAC OT 6 Clicks Daily Activity     Outcome Measure   Help from another person eating meals?: None Help  from another person taking care of personal grooming?: A Little Help from another person toileting, which includes using toliet, bedpan, or urinal?: A Lot Help from another person bathing (including washing, rinsing, drying)?: A Lot Help from another person to put on and taking off regular upper body clothing?: A Little Help from another person to put on and taking off regular lower body clothing?: A Lot 6 Click Score: 16    End of Session Equipment Utilized During Treatment: Gait belt;Oxygen  OT Visit Diagnosis: Muscle weakness (generalized) (M62.81);Pain Pain - Right/Left:  (bilat) Pain - part of body: Leg (BLE)   Activity Tolerance Patient tolerated treatment well   Patient Left in bed;with call bell/phone within reach;with bed alarm set   Nurse Communication          Time: 9066-9053 OT Time Calculation (min): 13 min  Charges: OT General Charges $OT Visit: 1 Visit OT Treatments $Self Care/Home Management : 8-22 mins  Warren SAUNDERS., MPH, MS, OTR/L ascom [phone] 07/20/23, 10:27 AM

## 2023-07-20 NOTE — Progress Notes (Signed)
 Physical Therapy Treatment Patient Details Name: Kristen Campbell MRN: [id number] DOB: [birth year] Today's Date: 07/20/2023   History of Present Illness Pt is a 78 y.o. female presenting to hospital 07/14/23 with c/o fatigue, body aches, chills, cough for past 3 days. (+) Influenza A.  Pt admitted with influenza A, acute respiratory failure with hypoxia, acute renal failure, acute metabolic acidosis.   PMH includes htn, CVA, CKD, dementia, CSF shunt, RCR, abdominal sx, cerebral aneurysm repair, abdominal sx.    PT Comments  Pt resting in bed upon PT arrival; pt agreeable to therapy with a little encouragement.  During session pt mod assist with bed mobility and mod assist with squat pivot transfer bed to recliner.  SpO2 sats 92% or greater on 3 L O2 via nasal cannula during sessions activities.  Pt did not want to sit in chair too long (around 20 minutes) d/t h/o back surgeries and bad experiences with being left in chair too long--OT notified and reported they would come soon to get pt back to bed (pt updated and agreeable).  Will continue to focus on strengthening, balance, and progressive functional mobility during hospitalization.   If plan is discharge home, recommend the following: A lot of help with walking and/or transfers;A little help with bathing/dressing/bathroom;Assistance with cooking/housework;Assist for transportation;Help with stairs or ramp for entrance   Can travel by private vehicle        Equipment Recommendations  None recommended by PT (pt has manual w/c at facility already per pt report)    Recommendations for Other Services       Precautions / Restrictions Precautions Precautions: Fall Restrictions Weight Bearing Restrictions Per Provider Order: No     Mobility  Bed Mobility Overal bed mobility: Needs Assistance Bed Mobility: Supine to Sit     Supine to sit: Mod assist, HOB elevated     General bed mobility comments: assist for trunk and to scoot to EOB     Transfers Overall transfer level: Needs assistance Equipment used: None Transfers: Bed to chair/wheelchair/BSC       Squat pivot transfers: Mod assist     General transfer comment: squat pivot transfer bed to recliner (to R side); vc's for technique    Ambulation/Gait               General Gait Details: Pt reports being non-ambulatory and declining ambulation (although pt then reports learning to walk at facility)   Stairs             Wheelchair Mobility     Tilt Bed    Modified Rankin (Stroke Patients Only)       Balance Overall balance assessment: Needs assistance Sitting-balance support: No upper extremity supported, Feet supported Sitting balance-Leahy Scale: Good Sitting balance - Comments: steady reaching within BOS                                    Cognition Arousal: Alert Behavior During Therapy: WFL for tasks assessed/performed Overall Cognitive Status: No family/caregiver present to determine baseline cognitive functioning                                 General Comments: Oriented to person, hospital, and general situation; pt reports she typically does not keep track of the date/time        Exercises General Exercises - Lower Extremity Long Arc  Quad: AROM, Strengthening, Both, 10 reps, Seated Hip Flexion/Marching: AROM, Strengthening, Both, 10 reps, Seated    General Comments        Pertinent Vitals/Pain Pain Assessment Pain Assessment: 0-10 Pain Score: 8  Pain Location: BLE pain (chronic per pt report) Pain Descriptors / Indicators: Aching Pain Intervention(s): Limited activity within patient's tolerance, Monitored during session, Repositioned, Patient requesting pain meds-RN notified HR stable during session.    Home Living                          Prior Function            PT Goals (current goals can now be found in the care plan section) Acute Rehab PT Goals Patient  Stated Goal: to feel and breathe better PT Goal Formulation: With patient Time For Goal Achievement: 07/30/23 Potential to Achieve Goals: Good Additional Goals Additional Goal #1: Pt able to propel manual w/c x50 feet with supervision. Progress towards PT goals: Progressing toward goals    Frequency    Min 1X/week      PT Plan      Co-evaluation              AM-PAC PT 6 Clicks Mobility   Outcome Measure  Help needed turning from your back to your side while in a flat bed without using bedrails?: A Little Help needed moving from lying on your back to sitting on the side of a flat bed without using bedrails?: A Lot Help needed moving to and from a bed to a chair (including a wheelchair)?: A Lot Help needed standing up from a chair using your arms (e.g., wheelchair or bedside chair)?: Total Help needed to walk in hospital room?: Total Help needed climbing 3-5 steps with a railing? : Total 6 Click Score: 10    End of Session Equipment Utilized During Treatment: Gait belt Activity Tolerance: Patient tolerated treatment well Patient left: in chair;with call bell/phone within reach;with chair alarm set;Other (comment) (MD present) Nurse Communication: Mobility status;Precautions;Patient requests pain meds PT Visit Diagnosis: Other abnormalities of gait and mobility (R26.89);Muscle weakness (generalized) (M62.81);Pain Pain - Right/Left:  (B) Pain - part of body: Leg     Time: 0850-0910 PT Time Calculation (min) (ACUTE ONLY): 20 min  Charges:    $Therapeutic Activity: 8-22 mins PT General Charges $$ ACUTE PT VISIT: 1 Visit                     Damien Caulk, PT 07/20/23, 9:32 AM

## 2023-07-20 NOTE — TOC Progression Note (Signed)
 Transition of Care [facility]) - Progression Note    Patient Details  Name: Kristen Campbell MRN: [id number] Date of Birth: 01-06-1946  Transition of Care [facility]) CM/SW Contact  Corean ONEIDA Haddock, RN Phone Number: 07/20/2023, 12:41 PM  Clinical Narrative:      Per MD anticipated patient will be ready to discharge today.  Per Celestine at the [facility] patient does not have O2 at the facility. Per MD plan to attempt to wean O2.  If unable to [facility] will arrange O2 at the facility.  Dustin at the [facility] updated  Patient will require Fl2 at discharge.    Expected Discharge Plan: Assisted Living Barriers to Discharge: Continued Medical Work up  Expected Discharge Plan and Services       Living arrangements for the past 2 months: Assisted Living Facility                                       Social Determinants of Health (SDOH) Interventions SDOH Screenings   Food Insecurity: No Food Insecurity (07/15/2023)  Housing: Low Risk  (07/15/2023)  Transportation Needs: No Transportation Needs (07/15/2023)  Utilities: Not At Risk (07/15/2023)  Depression (PHQ2-9): Medium Risk (04/15/2020)  Social Connections: Socially Integrated (07/15/2023)  Tobacco Use: Low Risk  (07/15/2023)    Readmission Risk Interventions    07/19/2023    9:42 AM  Readmission Risk Prevention Plan  Transportation Screening Complete  Medication Review (RN Care Manager) Complete  HRI or Home Care Consult Complete  SW Recovery Care/Counseling Consult Complete  Palliative Care Screening Not Applicable  Skilled Nursing Facility Not Applicable

## 2023-07-21 DIAGNOSIS — J101 Influenza due to other identified influenza virus with other respiratory manifestations: Secondary | ICD-10-CM | POA: Diagnosis not present

## 2023-07-21 LAB — CBC WITH DIFFERENTIAL/PLATELET
Abs Immature Granulocytes: 0.26 10*3/uL — ABNORMAL HIGH (ref 0.00–0.07)
Basophils Absolute: 0 10*3/uL (ref 0.0–0.1)
Basophils Relative: 0 %
Eosinophils Absolute: 0 10*3/uL (ref 0.0–0.5)
Eosinophils Relative: 0 %
HCT: 28.2 % — ABNORMAL LOW (ref 36.0–46.0)
Hemoglobin: 9 g/dL — ABNORMAL LOW (ref 12.0–15.0)
Immature Granulocytes: 4 %
Lymphocytes Relative: 19 %
Lymphs Abs: 1.4 10*3/uL (ref 0.7–4.0)
MCH: 28.3 pg (ref 26.0–34.0)
MCHC: 31.9 g/dL (ref 30.0–36.0)
MCV: 88.7 fL (ref 80.0–100.0)
Monocytes Absolute: 0.5 10*3/uL (ref 0.1–1.0)
Monocytes Relative: 7 %
Neutro Abs: 5 10*3/uL (ref 1.7–7.7)
Neutrophils Relative %: 70 %
Platelets: 204 10*3/uL (ref 150–400)
RBC: 3.18 MIL/uL — ABNORMAL LOW (ref 3.87–5.11)
RDW: 14.6 % (ref 11.5–15.5)
WBC: 7.2 10*3/uL (ref 4.0–10.5)
nRBC: 0 % (ref 0.0–0.2)

## 2023-07-21 LAB — COMPREHENSIVE METABOLIC PANEL
ALT: 16 U/L (ref 0–44)
AST: 18 U/L (ref 15–41)
Albumin: 3.1 g/dL — ABNORMAL LOW (ref 3.5–5.0)
Alkaline Phosphatase: 93 U/L (ref 38–126)
Anion gap: 10 (ref 5–15)
BUN: 53 mg/dL — ABNORMAL HIGH (ref 8–23)
CO2: 23 mmol/L (ref 22–32)
Calcium: 8.6 mg/dL — ABNORMAL LOW (ref 8.9–10.3)
Chloride: 104 mmol/L (ref 98–111)
Creatinine, Ser: 1.72 mg/dL — ABNORMAL HIGH (ref 0.44–1.00)
GFR, Estimated: 31 mL/min — ABNORMAL LOW (ref >60)
Glucose, Bld: 96 mg/dL (ref 70–99)
Potassium: 4.1 mmol/L (ref 3.5–5.1)
Sodium: 137 mmol/L (ref 135–145)
Total Bilirubin: 0.2 mg/dL (ref 0.0–1.2)
Total Protein: 6.3 g/dL — ABNORMAL LOW (ref 6.5–8.1)

## 2023-07-21 MED ORDER — PREDNISONE 20 MG PO TABS: 40.0000 mg | ORAL_TABLET | Freq: Every day | ORAL | 0 refills | Status: AC

## 2023-07-21 NOTE — Progress Notes (Signed)
 2/5 Patient under Droplet Precautions, IMM Letter given to RN assigned to patient to give to patient at bedside.

## 2023-07-21 NOTE — Progress Notes (Signed)
 Physical Therapy Treatment Patient Details Name: Kristen Campbell MRN: [id number] DOB: [birth year] Today's Date: 07/21/2023   History of Present Illness Pt is a 78 y.o. female presenting to hospital 07/14/23 with c/o fatigue, body aches, chills, cough for past 3 days. (+) Influenza A.  Pt admitted with influenza A, acute respiratory failure with hypoxia, acute renal failure, acute metabolic acidosis.   PMH includes htn, CVA, CKD, dementia, CSF shunt, RCR, abdominal sx, cerebral aneurysm repair, abdominal sx.    PT Comments  Pt was long sitting in bed upon arrival.She is alert and agreeable however seems to lack overall awareness of situation. Pt was on 2 L o2 upon arrival however was able to maintain > 90% on rm air. RN and MD aware. Pt does endorse not feeling well but  was able to exit R side of bed, stand, and ambulate to recliner. Pt unwilling to attempt further activity. HR below 110 bpm even with activity. Pt is planning to return to her home at the [facility].     If plan is discharge home, recommend the following: A lot of help with walking and/or transfers;A little help with bathing/dressing/bathroom;Assistance with cooking/housework;Assist for transportation;Help with stairs or ramp for entrance     Equipment Recommendations  None recommended by PT       Precautions / Restrictions Precautions Precautions: Fall Restrictions Weight Bearing Restrictions Per Provider Order: No     Mobility  Bed Mobility Overal bed mobility: Needs Assistance Bed Mobility: Supine to Sit, Sit to Supine  Supine to sit: Min assist, Mod assist, HOB elevated, Used rails Sit to supine: Min assist, Mod assist, HOB elevated, Used rails   Transfers Overall transfer level: Needs assistance Equipment used: None Transfers: Sit to/from Stand Sit to Stand: Min assist, Mod assist, From elevated surface   Ambulation/Gait Ambulation/Gait assistance: Min assist, Mod assist Gait Distance (Feet): 3 Feet Assistive  device: Rolling walker (2 wheels) Gait Pattern/deviations: Step-to pattern, Antalgic Gait velocity: decreased  General Gait Details: pt was able to take steps from EOB > recliner with min-mod assist of one.   Balance Overall balance assessment: Needs assistance Sitting-balance support: No upper extremity supported, Feet supported Sitting balance-Leahy Scale: Good     Standing balance support: Bilateral upper extremity supported, During functional activity, Reliant on assistive device for balance Standing balance-Leahy Scale: Poor Standing balance comment: high risk     Cognition Arousal: Alert Behavior During Therapy: Flat affect Overall Cognitive Status: No family/caregiver present to determine baseline cognitive functioning              Pertinent Vitals/Pain Pain Assessment Pain Assessment: 0-10 Pain Score: 4  Pain Location: chest pain. MD/RN aware Pain Descriptors / Indicators: Aching Pain Intervention(s): Limited activity within patient's tolerance, Monitored during session, Premedicated before session, Repositioned     PT Goals (current goals can now be found in the care plan section) Acute Rehab PT Goals Patient Stated Goal: none stated Progress towards PT goals: Progressing toward goals    Frequency    Min 1X/week       AM-PAC PT 6 Clicks Mobility   Outcome Measure  Help needed turning from your back to your side while in a flat bed without using bedrails?: A Little Help needed moving from lying on your back to sitting on the side of a flat bed without using bedrails?: A Lot Help needed moving to and from a bed to a chair (including a wheelchair)?: A Lot Help needed standing up from a chair using  your arms (e.g., wheelchair or bedside chair)?: A Lot Help needed to walk in hospital room?: A Lot Help needed climbing 3-5 steps with a railing? : Total 6 Click Score: 12    End of Session   Activity Tolerance: Patient tolerated treatment well;Patient  limited by fatigue Patient left: in chair;with call bell/phone within reach;with chair alarm set;Other (comment) Nurse Communication: Mobility status;Precautions;Patient requests pain meds PT Visit Diagnosis: Other abnormalities of gait and mobility (R26.89);Muscle weakness (generalized) (M62.81);Pain Pain - part of body: Leg     Time: 8947-8885 PT Time Calculation (min) (ACUTE ONLY): 22 min  Charges:    $Therapeutic Activity: 8-22 mins PT General Charges $$ ACUTE PT VISIT: 1 Visit                     Rankin Essex PTA 07/21/23, 12:10 PM

## 2023-07-21 NOTE — Plan of Care (Signed)

## 2023-07-21 NOTE — NC FL2 (Signed)
 [place]  MEDICAID FL2 LEVEL OF CARE FORM     IDENTIFICATION  Patient Name: Kristen Campbell Birthdate: [birth year] Sex: female Admission Date (Current Location): 07/14/2023  [facility] and [place] Number:  Chiropodist and Address:         Provider Number: [id number]  Attending Physician Name and Address:  Trudy Anthony HERO, MD  Relative Name and Phone Number:       Current Level of Care: Hospital Recommended Level of Care: Assisted Living Facility Prior Approval Number:    Date Approved/Denied:   PASRR Number:    Discharge Plan: Other (Comment) (ALF)    Current Diagnoses: Patient Active Problem List   Diagnosis Date Noted   Acute metabolic acidosis 07/17/2023   Acute respiratory failure with hypoxia (HCC) 07/14/2023   Influenza A 07/14/2023   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   Cerebrovascular disease    Acute renal failure superimposed on stage 3b chronic kidney disease (HCC)  - Baseline Scr 1.7-1.9 01/18/2020   Bilateral leg edema 04/07/2019   Essential hypertension 03/12/2019   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   Anemia 06/02/2017   History of CVA (cerebrovascular accident) 06/02/2017   Hyperlipidemia 06/02/2017   History of esophageal ulcer 08/10/2015   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 07/02/2015   Caregiver with fatigue 09/09/2014   Adjustment disorder with depressed mood 09/09/2014    Orientation RESPIRATION BLADDER Height & Weight     Self, Time, Place, Situation  Normal   Weight: 72.3 kg Height:  5' (152.4 cm)  BEHAVIORAL SYMPTOMS/MOOD NEUROLOGICAL BOWEL NUTRITION STATUS      Continent Diet (Low sodium heart healthy)  AMBULATORY STATUS COMMUNICATION OF NEEDS Skin   Extensive Assist Verbally Normal                       Personal Care Assistance Level of Assistance              Functional Limitations Info             SPECIAL CARE FACTORS FREQUENCY  OT (By licensed  OT), PT (By licensed PT)     PT Frequency: enhabit OT Frequency: enhabit            Contractures Contractures Info: Not present    Additional Factors Info  Code Status, Isolation Precautions Code Status Info: Full       Isolation Precautions Info: flu positive 1/29     Medication List       STOP taking these medications     promethazine-dextromethorphan  6.25-15 MG/5ML syrup Commonly known as: PROMETHAZINE-DM           TAKE these medications     acetaminophen  325 MG tablet Commonly known as: TYLENOL  Take 650 mg by mouth every 6 (six) hours as needed for fever (100 and above).    DULoxetine  60 MG capsule Commonly known as: CYMBALTA  Take 60 mg by mouth daily.    furosemide  20 MG tablet Commonly known as: LASIX  Take 1 tablet (20 mg total) by mouth daily.    loperamide 2 MG tablet Commonly known as: IMODIUM A-D Take 4 mg by mouth 4 (four) times daily as needed for diarrhea or loose stools.    loratadine 10 MG tablet Commonly known as: CLARITIN Take 10 mg by mouth daily as needed for allergies.    LORazepam  0.5 MG tablet Commonly known as: ATIVAN  Take 0.5 mg by mouth every 8 (eight) hours as needed for  anxiety.    omeprazole 20 MG capsule Commonly known as: PRILOSEC Take 20 mg by mouth daily.    ondansetron  4 MG tablet Commonly known as: Zofran  Take 1 tablet (4 mg total) by mouth every 8 (eight) hours as needed.    potassium chloride 20 MEQ/15ML (10%) Soln Take 15 mLs by mouth 2 (two) times daily.    predniSONE  20 MG tablet Commonly known as: DELTASONE  Take 2 tablets (40 mg total) by mouth daily with breakfast for 4 days. Start taking on: July 22, 2023    pregabalin  50 MG capsule Commonly known as: LYRICA  Take 50 mg by mouth 3 (three) times daily.    QUEtiapine  25 MG tablet Commonly known as: SEROQUEL  Take 12.5 mg by mouth 2 (two) times daily.    traMADol  50 MG tablet Commonly known as: ULTRAM  Take 50 mg by mouth every 6 (six) hours as  needed for moderate pain (pain score 4-6) or severe pain (pain score 7-10). Use first before trying dilaudid  (hydromorphone )    Relevant Imaging Results:  Relevant Lab Results:   Additional Information [id number]  Corean ONEIDA Haddock, RN

## 2023-07-21 NOTE — Discharge Summary (Signed)
 Physician Discharge Summary  Kristen Campbell FMW:[id number] DOB: [birth year] DOA: 07/14/2023  PCP: Avram Barnie NOVAK, FNP  Admit date: 07/14/2023 Discharge date: 07/21/2023  Admitted From: ALF Disposition:  ALF  Recommendations for Outpatient Follow-up:  Follow up with PCP in 1-2 weeks   Home Health: yes Equipment/Devices:  Discharge Condition: stable  CODE STATUS: full  Diet recommendation: Heart Healthy  Brief/Interim Summary: HPI was taken from Dr. Cleatus: Kristen Campbell is a 78 y.o. female with medical history significant for CVA, brain aneurysm s/p VP shunt, CKD 3B, dementia and hypertension, with last hospitalization in October 2024 for altered mental status rule out for CVA with MRI, who presents to the ED with a 3-day history of cough, shortness of breath, weakness and decreased oral intake, with O2 sat 91% with EMS and no history of prior O2 requirement.  She denied chest pain. ED course and data review: Vitals within normal limits Workup notable for influenza A+ with normal WBC and negative CXR Troponin 23 and BNP 32.8 BMP notable for creatinine of 2, up from baseline of 1.78 for her CKD with bicarb of 19 EKG, personally viewed and interpreted showing NSR at 86 with no concerning ischemic ST-T wave changes Chest x-ray with no active cardiopulmonary disease Patient treated with a DuoNeb and given an NS 1 L bolus Hospitalist consulted for admission for influenza A with acute respiratory failure.  Tamiflu  not started as patient on the outside 72-hour window.  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 Continue on bronchodilators, steroids x 4 days and encourage incentive spirometry & flutter valve  Metabolic acidosis: resolved  Acute hypoxic respiratory failure: weaned off of supplemental oxygen   AKI on CKDIIIb: Cr  is labile. Avoid nephrotoxic meds  Dementia: alzheimers and vascular dementia. Continue w/ supportive care  Hx of VP shunt: last shunt revision 2016.   Hx of esophageal ulcer: continue on PPI   Hx of CVA: not on aspirin, statin, or plavix as per med rec  HTN: continue on amlodipine   Discharge Instructions  Discharge Instructions     Diet - low sodium heart healthy   Complete by: As directed    Discharge instructions   Complete by: As directed    F/u w/ PCP in 1-2 weeks   Increase activity slowly   Complete by: As directed       Allergies as of 07/21/2023       Reactions   Carbamazepine Other (See Comments)   Hallucinations/ reaction to Tegretol   Dilantin [phenytoin] Other (See Comments)   MD said never to take it - unknown reaction   Potassium Chloride Itching, Other (See Comments)   Reaction to KlorCon   Ibuprofen  Itching   S/w daughter on 04/07/23 and confirmed that she does not have an allergy to ibuprofen , she states that her MD does not want her to take ibuprofen  due to ulcers but okay to take tylenol     Codeine  Nausea And Vomiting        Medication List     STOP  taking these medications    promethazine-dextromethorphan  6.25-15 MG/5ML syrup Commonly known as: PROMETHAZINE-DM       TAKE these medications    acetaminophen  325 MG tablet Commonly known as: TYLENOL  Take 650 mg by mouth every 6 (six) hours as needed for fever (100 and above).   DULoxetine  60 MG capsule Commonly known as: CYMBALTA  Take 60 mg by mouth daily.   furosemide  20 MG tablet Commonly known as: LASIX  Take 1 tablet (20 mg total) by mouth daily.    loperamide 2 MG tablet Commonly known as: IMODIUM A-D Take 4 mg by mouth 4 (four) times daily as needed for diarrhea or loose stools.   loratadine 10 MG tablet Commonly known as: CLARITIN Take 10 mg by mouth daily as needed for allergies.   LORazepam  0.5 MG tablet Commonly known as: ATIVAN  Take 0.5 mg by mouth every 8 (eight) hours as needed for anxiety.   omeprazole 20 MG capsule Commonly known as: PRILOSEC Take 20 mg by mouth daily.   ondansetron  4 MG tablet Commonly known as: Zofran  Take 1 tablet (4 mg total) by mouth every 8 (eight) hours as needed.   potassium chloride 20 MEQ/15ML (10%) Soln Take 15 mLs by mouth 2 (two) times daily.   predniSONE  20 MG tablet Commonly known as: DELTASONE  Take 2 tablets (40 mg total) by mouth daily with breakfast for 4 days. Start taking on: July 22, 2023   pregabalin  50 MG capsule Commonly known as: LYRICA  Take 50 mg by mouth 3 (three) times daily.   QUEtiapine  25 MG tablet Commonly known as: SEROQUEL  Take 12.5 mg by mouth 2 (two) times daily.   traMADol  50 MG tablet Commonly known as: ULTRAM  Take 50 mg by mouth every 6 (six) hours as needed for moderate pain (pain score 4-6) or severe pain (pain score 7-10). Use first before trying dilaudid  (hydromorphone )        Allergies  Allergen Reactions   Carbamazepine Other (See Comments)    Hallucinations/ reaction to Tegretol    Dilantin [Phenytoin] Other (See Comments)    MD said never to take it - unknown reaction   Potassium Chloride Itching and Other (See Comments)    Reaction to KlorCon   Ibuprofen  Itching    S/w daughter on 04/07/23 and confirmed that she does not have an allergy to ibuprofen , she states that her MD does not want her to take ibuprofen  due to ulcers but okay to take tylenol     Codeine  Nausea And Vomiting    Consultations:    Procedures/Studies: DG Chest 2 View Result Date: 07/14/2023 CLINICAL DATA:  Cough for 2 days. EXAM: CHEST - 2 VIEW  COMPARISON:  June 09, 2023. FINDINGS: The heart size and mediastinal contours are within normal limits. Both lungs are clear. The visualized skeletal structures are unremarkable. IMPRESSION: No active cardiopulmonary disease. Electronically Signed   By: Lynwood Landy Raddle M.D.   On: 07/14/2023 16:36   (Echo, Carotid, EGD, Colonoscopy, ERCP)    Subjective: Pt c/o fatigue    Discharge Exam: Vitals:   07/21/23 0731 07/21/23 0733  BP: (!) 150/68   Pulse: 71   Resp: 16   Temp: 97.7 F (36.5 C)   SpO2: 94% 95%   Vitals:   07/20/23 2026 07/21/23 0416 07/21/23 0731 07/21/23 0733  BP: (!) 148/70 (!) 148/59 (!) 150/68   Pulse: 89 70 71   Resp: 16 16 16    Temp: 98.3 F (36.8 C) 98.2 F (36.8 C) 97.7 F (36.5 C)  TempSrc: Oral Oral Oral   SpO2: 95% 99% 94% 95%  Weight:      Height:        General: Pt is alert, awake, not in acute distress Cardiovascular:  S1/S2 +, no rubs, no gallops Respiratory: decreased breath sounds b/l  Abdominal: Soft, NT, obese, bowel sounds + Extremities:  no cyanosis    The results of significant diagnostics from this hospitalization (including imaging, microbiology, ancillary and laboratory) are listed below for reference.     Microbiology: Recent Results (from the past 240 hours)  Resp panel by RT-PCR (RSV, Flu A&B, Covid) Anterior Nasal Swab     Status: Abnormal   Collection Time: 07/14/23  3:17 PM   Specimen: Anterior Nasal Swab  Result Value Ref Range Status   SARS Coronavirus 2 by RT PCR NEGATIVE NEGATIVE Final    Comment: (NOTE) SARS-CoV-2 target nucleic acids are NOT DETECTED.  The SARS-CoV-2 RNA is generally detectable in upper respiratory specimens during the acute phase of infection. The lowest concentration of SARS-CoV-2 viral copies this assay can detect is 138 copies/mL. A negative result does not preclude SARS-Cov-2 infection and should not be used as the sole basis for treatment or other patient management decisions. A  negative result may occur with  improper specimen collection/handling, submission of specimen other than nasopharyngeal swab, presence of viral mutation(s) within the areas targeted by this assay, and inadequate number of viral copies(<138 copies/mL). A negative result must be combined with clinical observations, patient history, and epidemiological information. The expected result is Negative.  Fact Sheet for Patients:  bloggercourse.com  Fact Sheet for Healthcare Providers:  seriousbroker.it  This test is no t yet approved or cleared by the [place]  FDA and  has been authorized for detection and/or diagnosis of SARS-CoV-2 by FDA under an Emergency Use Authorization (EUA). This EUA will remain  in effect (meaning this test can be used) for the duration of the COVID-19 declaration under Section 564(b)(1) of the Act, 21 U.S.C.section 360bbb-3(b)(1), unless the authorization is terminated  or revoked sooner.       Influenza A by PCR POSITIVE (A) NEGATIVE Final   Influenza B by PCR NEGATIVE NEGATIVE Final    Comment: (NOTE) The Xpert Xpress SARS-CoV-2/FLU/RSV plus assay is intended as an aid in the diagnosis of influenza from Nasopharyngeal swab specimens and should not be used as a sole basis for treatment. Nasal washings and aspirates are unacceptable for Xpert Xpress SARS-CoV-2/FLU/RSV testing.  Fact Sheet for Patients: bloggercourse.com  Fact Sheet for Healthcare Providers: seriousbroker.it  This test is not yet approved or cleared by the [place]  FDA and has been authorized for detection and/or diagnosis of SARS-CoV-2 by FDA under an Emergency Use Authorization (EUA). This EUA will remain in effect (meaning this test can be used) for the duration of the COVID-19 declaration under Section 564(b)(1) of the Act, 21 U.S.C. section 360bbb-3(b)(1), unless the  authorization is terminated or revoked.     Resp Syncytial Virus by PCR NEGATIVE NEGATIVE Final    Comment: (NOTE) Fact Sheet for Patients: bloggercourse.com  Fact Sheet for Healthcare Providers: seriousbroker.it  This test is not yet approved or cleared by the [place]  FDA and has been authorized for detection and/or diagnosis of SARS-CoV-2 by FDA under an Emergency Use Authorization (EUA). This EUA will remain in effect (meaning this test can be used) for the duration of the COVID-19 declaration under Section 564(b)(1) of the Act, 21 U.S.C. section 360bbb-3(b)(1), unless the authorization is terminated or  revoked.  Performed at [facility], [address] [place], [place] [address]   Respiratory (~20 pathogens) panel by PCR     Status: Abnormal   Collection Time: 07/16/23  3:36 PM   Specimen: Nasopharyngeal Swab; Respiratory  Result Value Ref Range Status   Adenovirus NOT DETECTED NOT DETECTED Final   Coronavirus 229E NOT DETECTED NOT DETECTED Final    Comment: (NOTE) The Coronavirus on the Respiratory Panel, DOES NOT test for the novel  Coronavirus (2019 nCoV)    Coronavirus HKU1 NOT DETECTED NOT DETECTED Final   Coronavirus NL63 NOT DETECTED NOT DETECTED Final   Coronavirus OC43 NOT DETECTED NOT DETECTED Final   Metapneumovirus NOT DETECTED NOT DETECTED Final   Rhinovirus / Enterovirus NOT DETECTED NOT DETECTED Final   Influenza A H1 2009 DETECTED (A) NOT DETECTED Final   Influenza B NOT DETECTED NOT DETECTED Final   Parainfluenza Virus 1 NOT DETECTED NOT DETECTED Final   Parainfluenza Virus 2 NOT DETECTED NOT DETECTED Final   Parainfluenza Virus 3 NOT DETECTED NOT DETECTED Final   Parainfluenza Virus 4 NOT DETECTED NOT DETECTED Final   Respiratory Syncytial Virus NOT DETECTED NOT DETECTED Final   Bordetella pertussis NOT DETECTED NOT DETECTED Final   Bordetella Parapertussis NOT DETECTED NOT DETECTED  Final   Chlamydophila pneumoniae NOT DETECTED NOT DETECTED Final   Mycoplasma pneumoniae NOT DETECTED NOT DETECTED Final    Comment: Performed at [facility], [address] [place], [place] [address]     Labs: BNP (last 3 results) Recent Labs    07/14/23 1517  BNP 32.8   Basic Metabolic Panel: Recent Labs  Lab 07/16/23 0907 07/17/23 0458 07/18/23 0358 07/19/23 0540 07/21/23 0458  NA 142 141 139 139 137  K 4.2 4.2 4.1 4.6 4.1  CL 109 108 105 103 104  CO2 21* 24 24 23 23   GLUCOSE 104* 93 89 174* 96  BUN 36* 35* 38* 40* 53*  CREATININE 1.60* 1.69* 1.70* 1.61* 1.72*  CALCIUM  8.7* 8.4* 8.5* 9.1 8.6*  MG  --   --  1.9  --   --    Liver Function Tests: Recent Labs  Lab 07/18/23 0358 07/19/23 0540 07/21/23 0458  AST 16 18 18   ALT 13 15 16   ALKPHOS 101 104 93  BILITOT 0.3 0.7 0.2  PROT 6.3* 6.9 6.3*  ALBUMIN  3.4* 3.3* 3.1*   No results for input(s): LIPASE, AMYLASE in the last 168 hours. No results for input(s): AMMONIA in the last 168 hours. CBC: Recent Labs  Lab 07/16/23 0907 07/17/23 0458 07/18/23 0358 07/19/23 0540 07/21/23 0458  WBC 2.6* 2.4* 4.4 2.9* 7.2  NEUTROABS  --   --  2.6 2.3 5.0  HGB 9.6* 8.6* 9.4* 9.5* 9.0*  HCT 31.0* 27.1* 30.2* 30.1* 28.2*  MCV 90.4 89.1 90.7 88.5 88.7  PLT 137* 136* 154 178 204   Cardiac Enzymes: No results for input(s): CKTOTAL, CKMB, CKMBINDEX, TROPONINI in the last 168 hours. BNP: Invalid input(s): POCBNP CBG: No results for input(s): GLUCAP in the last 168 hours. D-Dimer No results for input(s): DDIMER in the last 72 hours. Hgb A1c No results for input(s): HGBA1C in the last 72 hours. Lipid Profile No results for input(s): CHOL, HDL, LDLCALC, TRIG, CHOLHDL, LDLDIRECT in the last 72 hours. Thyroid  function studies No results for input(s): TSH, T4TOTAL, T3FREE, THYROIDAB in the last 72 hours.  Invalid input(s): FREET3 Anemia work up No results for input(s):  VITAMINB12, FOLATE, FERRITIN, TIBC, IRON, RETICCTPCT  in the last 72 hours. Urinalysis    Component Value Date/Time   COLORURINE YELLOW (A) 06/09/2023 2256   APPEARANCEUR HAZY (A) 06/09/2023 2256   LABSPEC 1.016 06/09/2023 2256   PHURINE 5.0 06/09/2023 2256   GLUCOSEU NEGATIVE 06/09/2023 2256   HGBUR MODERATE (A) 06/09/2023 2256   BILIRUBINUR NEGATIVE 06/09/2023 2256   KETONESUR NEGATIVE 06/09/2023 2256   PROTEINUR NEGATIVE 06/09/2023 2256   UROBILINOGEN 1.0 09/08/2014 1515   NITRITE NEGATIVE 06/09/2023 2256   LEUKOCYTESUR LARGE (A) 06/09/2023 2256   Sepsis Labs Recent Labs  Lab 07/17/23 0458 07/18/23 0358 07/19/23 0540 07/21/23 0458  WBC 2.4* 4.4 2.9* 7.2   Microbiology Recent Results (from the past 240 hours)  Resp panel by RT-PCR (RSV, Flu A&B, Covid) Anterior Nasal Swab     Status: Abnormal   Collection Time: 07/14/23  3:17 PM   Specimen: Anterior Nasal Swab  Result Value Ref Range Status   SARS Coronavirus 2 by RT PCR NEGATIVE NEGATIVE Final    Comment: (NOTE) SARS-CoV-2 target nucleic acids are NOT DETECTED.  The SARS-CoV-2 RNA is generally detectable in upper respiratory specimens during the acute phase of infection. The lowest concentration of SARS-CoV-2 viral copies this assay can detect is 138 copies/mL. A negative result does not preclude SARS-Cov-2 infection and should not be used as the sole basis for treatment or other patient management decisions. A negative result may occur with  improper specimen collection/handling, submission of specimen other than nasopharyngeal swab, presence of viral mutation(s) within the areas targeted by this assay, and inadequate number of viral copies(<138 copies/mL). A negative result must be combined with clinical observations, patient history, and epidemiological information. The expected result is Negative.  Fact Sheet for Patients:  bloggercourse.com  Fact Sheet for Healthcare  Providers:  seriousbroker.it  This test is no t yet approved or cleared by the [place]  FDA and  has been authorized for detection and/or diagnosis of SARS-CoV-2 by FDA under an Emergency Use Authorization (EUA). This EUA will remain  in effect (meaning this test can be used) for the duration of the COVID-19 declaration under Section 564(b)(1) of the Act, 21 U.S.C.section 360bbb-3(b)(1), unless the authorization is terminated  or revoked sooner.       Influenza A by PCR POSITIVE (A) NEGATIVE Final   Influenza B by PCR NEGATIVE NEGATIVE Final    Comment: (NOTE) The Xpert Xpress SARS-CoV-2/FLU/RSV plus assay is intended as an aid in the diagnosis of influenza from Nasopharyngeal swab specimens and should not be used as a sole basis for treatment. Nasal washings and aspirates are unacceptable for Xpert Xpress SARS-CoV-2/FLU/RSV testing.  Fact Sheet for Patients: bloggercourse.com  Fact Sheet for Healthcare Providers: seriousbroker.it  This test is not yet approved or cleared by the [place]  FDA and has been authorized for detection and/or diagnosis of SARS-CoV-2 by FDA under an Emergency Use Authorization (EUA). This EUA will remain in effect (meaning this test can be used) for the duration of the COVID-19 declaration under Section 564(b)(1) of the Act, 21 U.S.C. section 360bbb-3(b)(1), unless the authorization is terminated or revoked.     Resp Syncytial Virus by PCR NEGATIVE NEGATIVE Final    Comment: (NOTE) Fact Sheet for Patients: bloggercourse.com  Fact Sheet for Healthcare Providers: seriousbroker.it  This test is not yet approved or cleared by the [place]  FDA and has been authorized for detection and/or diagnosis of SARS-CoV-2 by FDA under an Emergency Use Authorization (EUA). This EUA will remain in effect (meaning this test  can be used) for the duration of the COVID-19 declaration under Section 564(b)(1) of the Act, 21 U.S.C. section 360bbb-3(b)(1), unless the authorization is terminated or revoked.  Performed at [facility], [address] [place], [place] [address]   Respiratory (~20 pathogens) panel by PCR     Status: Abnormal   Collection Time: 07/16/23  3:36 PM   Specimen: Nasopharyngeal Swab; Respiratory  Result Value Ref Range Status   Adenovirus NOT DETECTED NOT DETECTED Final   Coronavirus 229E NOT DETECTED NOT DETECTED Final    Comment: (NOTE) The Coronavirus on the Respiratory Panel, DOES NOT test for the novel  Coronavirus (2019 nCoV)    Coronavirus HKU1 NOT DETECTED NOT DETECTED Final   Coronavirus NL63 NOT DETECTED NOT DETECTED Final   Coronavirus OC43 NOT DETECTED NOT DETECTED Final   Metapneumovirus NOT DETECTED NOT DETECTED Final   Rhinovirus / Enterovirus NOT DETECTED NOT DETECTED Final   Influenza A H1 2009 DETECTED (A) NOT DETECTED Final   Influenza B NOT DETECTED NOT DETECTED Final   Parainfluenza Virus 1 NOT DETECTED NOT DETECTED Final   Parainfluenza Virus 2 NOT DETECTED NOT DETECTED Final   Parainfluenza Virus 3 NOT DETECTED NOT DETECTED Final   Parainfluenza Virus 4 NOT DETECTED NOT DETECTED Final   Respiratory Syncytial Virus NOT DETECTED NOT DETECTED Final   Bordetella pertussis NOT DETECTED NOT DETECTED Final   Bordetella Parapertussis NOT DETECTED NOT DETECTED Final   Chlamydophila pneumoniae NOT DETECTED NOT DETECTED Final   Mycoplasma pneumoniae NOT DETECTED NOT DETECTED Final    Comment: Performed at [facility], [address] [place], [place] [address]     Time coordinating discharge: Over 30 minutes  SIGNED:   Anthony CHRISTELLA Pouch, MD  [facility] 07/21/2023, 12:59 PM Pager   If 7PM-7AM, please contact night-coverage [email or web address]

## 2023-07-21 NOTE — TOC Transition Note (Signed)
 Transition of Care [facility]) - Discharge Note   Patient Details  Name: Kristen Campbell MRN: [id number] Date of Birth: November 23, 1945  [facility] CM/SW Contact:  Corean ONEIDA Haddock, RN Phone Number: 07/21/2023, 3:02 PM   Clinical Narrative:     Patient to discharge today Daughter notified DC summary and fl2 secure emailed to [facility]  Bedside RN provided number to call report [facility] to provide dc transport Shelia with [facility] notified of discharge Patient did not qualify for home O2     Barriers to Discharge: Continued Medical Work up   Patient Goals and CMS Choice   CMS Medicare.gov Compare Post Acute Care list provided to:: Patient Represenative (must comment) Choice offered to / list presented to : Adult Children      Discharge Placement                       Discharge Plan and Services Additional resources added to the After Visit Summary for                                       Social Drivers of Health (SDOH) Interventions SDOH Screenings   Food Insecurity: No Food Insecurity (07/15/2023)  Housing: Low Risk  (07/15/2023)  Transportation Needs: No Transportation Needs (07/15/2023)  Utilities: Not At Risk (07/15/2023)  Depression (PHQ2-9): Medium Risk (04/15/2020)  Social Connections: Socially Integrated (07/15/2023)  Tobacco Use: Low Risk  (07/15/2023)     Readmission Risk Interventions    07/19/2023    9:42 AM  Readmission Risk Prevention Plan  Transportation Screening Complete  Medication Review (RN Care Manager) Complete  HRI or Home Care Consult Complete  SW Recovery Care/Counseling Consult Complete  Palliative Care Screening Not Applicable  Skilled Nursing Facility Not Applicable

## 2023-07-21 NOTE — Care Management Important Message (Signed)
 Important Message  Patient Details  Name: Kristen Campbell MRN: [id number] Date of Birth: Apr 16, 1946   Important Message Given:  Other (see comment)     Felix Host 07/21/2023, 10:36 AM

## 2023-07-27 ENCOUNTER — Emergency Department: Payer: Medicare Other

## 2023-07-27 ENCOUNTER — Emergency Department
Admission: EM | Admit: 2023-07-27 | Discharge: 2023-07-27 | Disposition: A | Discharge status: Skilled Nursing Facility | Payer: Medicare Other | Attending: Emergency Medicine | Admitting: Emergency Medicine

## 2023-07-27 ENCOUNTER — Other Ambulatory Visit: Payer: Self-pay

## 2023-07-27 DIAGNOSIS — I509 Heart failure, unspecified: Secondary | ICD-10-CM | POA: Diagnosis not present

## 2023-07-27 DIAGNOSIS — M79605 Pain in left leg: Secondary | ICD-10-CM | POA: Diagnosis not present

## 2023-07-27 DIAGNOSIS — M79604 Pain in right leg: Secondary | ICD-10-CM | POA: Insufficient documentation

## 2023-07-27 DIAGNOSIS — I110 Hypertensive heart disease with heart failure: Secondary | ICD-10-CM | POA: Insufficient documentation

## 2023-07-27 DIAGNOSIS — N179 Acute kidney failure, unspecified: Secondary | ICD-10-CM | POA: Diagnosis not present

## 2023-07-27 DIAGNOSIS — F0150 Vascular dementia without behavioral disturbance: Secondary | ICD-10-CM | POA: Insufficient documentation

## 2023-07-27 DIAGNOSIS — G629 Polyneuropathy, unspecified: Secondary | ICD-10-CM

## 2023-07-27 DIAGNOSIS — E875 Hyperkalemia: Secondary | ICD-10-CM | POA: Insufficient documentation

## 2023-07-27 LAB — CBC WITH DIFFERENTIAL/PLATELET
Abs Immature Granulocytes: 0.13 10*3/uL — ABNORMAL HIGH (ref 0.00–0.07)
Basophils Absolute: 0 10*3/uL (ref 0.0–0.1)
Basophils Relative: 0 %
Eosinophils Absolute: 0 10*3/uL (ref 0.0–0.5)
Eosinophils Relative: 0 %
HCT: 38.7 % (ref 36.0–46.0)
Hemoglobin: 11.9 g/dL — ABNORMAL LOW (ref 12.0–15.0)
Immature Granulocytes: 1 %
Lymphocytes Relative: 13 %
Lymphs Abs: 1.3 10*3/uL (ref 0.7–4.0)
MCH: 28.5 pg (ref 26.0–34.0)
MCHC: 30.7 g/dL (ref 30.0–36.0)
MCV: 92.6 fL (ref 80.0–100.0)
Monocytes Absolute: 0.6 10*3/uL (ref 0.1–1.0)
Monocytes Relative: 6 %
Neutro Abs: 8 10*3/uL — ABNORMAL HIGH (ref 1.7–7.7)
Neutrophils Relative %: 80 %
Platelets: 313 10*3/uL (ref 150–400)
RBC: 4.18 MIL/uL (ref 3.87–5.11)
RDW: 15.1 % (ref 11.5–15.5)
WBC: 10 10*3/uL (ref 4.0–10.5)
nRBC: 0 % (ref 0.0–0.2)

## 2023-07-27 LAB — BASIC METABOLIC PANEL
Anion gap: 12 (ref 5–15)
BUN: 48 mg/dL — ABNORMAL HIGH (ref 8–23)
CO2: 24 mmol/L (ref 22–32)
Calcium: 8.8 mg/dL — ABNORMAL LOW (ref 8.9–10.3)
Chloride: 101 mmol/L (ref 98–111)
Creatinine, Ser: 1.93 mg/dL — ABNORMAL HIGH (ref 0.44–1.00)
GFR, Estimated: 27 mL/min — ABNORMAL LOW (ref >60)
Glucose, Bld: 106 mg/dL — ABNORMAL HIGH (ref 70–99)
Potassium: 5.7 mmol/L — ABNORMAL HIGH (ref 3.5–5.1)
Sodium: 137 mmol/L (ref 135–145)

## 2023-07-27 LAB — TROPONIN I (HIGH SENSITIVITY): Troponin I (High Sensitivity): 18 ng/L — ABNORMAL HIGH (ref <18)

## 2023-07-27 LAB — BRAIN NATRIURETIC PEPTIDE: B Natriuretic Peptide: 65.9 pg/mL (ref 0.0–100.0)

## 2023-07-27 LAB — CK: Total CK: 26 U/L — ABNORMAL LOW (ref 38–234)

## 2023-07-27 MED ORDER — GABAPENTIN 100 MG PO CAPS
100.0000 mg | ORAL_CAPSULE | Freq: Once | ORAL | Status: AC
  Administered 2023-07-27: 100 mg via ORAL
  Filled 2023-07-27: qty 1

## 2023-07-27 MED ORDER — HYDROCODONE-ACETAMINOPHEN 5-325 MG PO TABS
1.0000 | ORAL_TABLET | Freq: Once | ORAL | Status: AC
  Administered 2023-07-27: 1 via ORAL
  Filled 2023-07-27: qty 1

## 2023-07-27 MED ORDER — SODIUM CHLORIDE 0.9 % IV BOLUS
500.0000 mL | Freq: Once | INTRAVENOUS | Status: AC
  Administered 2023-07-27: 500 mL via INTRAVENOUS

## 2023-07-27 MED ORDER — GABAPENTIN 100 MG PO CAPS
100.0000 mg | ORAL_CAPSULE | Freq: Three times a day (TID) | ORAL | 0 refills | Status: AC
Start: 2023-07-27 — End: 2023-08-10

## 2023-07-27 MED ORDER — PREGABALIN 25 MG PO CAPS: 25.0000 mg | ORAL_CAPSULE | Freq: Once | ORAL | Status: DC

## 2023-07-27 NOTE — ED Triage Notes (Signed)
Pt to ED via EMS from [facility], pt c/o bilateral leg pain that began yesterday pt denies injury. Pt reports some swelling to legs also. Pt was given tramadol at 2100 and tylenol PTA. Pt denies SOB.

## 2023-07-27 NOTE — Discharge Instructions (Addendum)
You were seen in the emergency department for bilateral leg pain from your knees to your feet that was a tingling sensation.  You had an ultrasound that did not show any blood clots to your lower legs.  Your lab work was overall at your normal.  You had mildly worsening kidney function and you need to follow-up with your primary care physician about your kidney function.  Your potassium level was mildly elevated in the emergency department.  It is important that you have your electrolytes rechecked with your primary care physician within 1 week to make sure that your lab work is not worsening.  Concerned that you could have neuropathy to your lower legs.  You were started on gabapentin at a very low dose.  Take this medication as prescribed and follow-up with your primary care physician for any further increase of the dose.  Return to the emergency department for any worsening symptoms.  Thank you for choosing [place] for your health care, it was my pleasure to care for you today!  Corena Herter, MD

## 2023-07-27 NOTE — ED Notes (Signed)
[facility] called for transport to the [facility] [address]

## 2023-07-27 NOTE — ED Provider Notes (Signed)
[facility] Provider Note    Event Date/Time   First MD Initiated Contact with Patient 07/27/23 [phone]     (approximate)   History   Leg Pain   HPI  Kristen Campbell is a 78 y.o. female brought to the ED via EMS from the [facility] [address] with a chief complaint of nontraumatic bilateral leg pain x 1 to 2 days.  Patient also endorses BLE swelling.  Denies fall/injury/trauma.  Denies fever/chills, chest pain, shortness of breath, abdominal pain, nausea, vomiting or dizziness.  Given tramadol and Tylenol prior to arrival.     Past Medical History   Past Medical History:  Diagnosis Date   Atherosclerotic heart disease    Brain aneurysm    Cerebral embolism with cerebral infarction (HCC) 12/12/2014   Cerebrovascular disease    Chronic kidney disease    Depression    Heart failure (HCC)    Hypertension    Pneumococcal pneumonia (HCC) 01/18/2020   Pyloric ulcer 03/12/2019   Vascular dementia [facility])      Active Problem List   Patient Active Problem List   Diagnosis Date Noted   Acute metabolic acidosis 07/17/2023   Acute respiratory failure with hypoxia (HCC) 07/14/2023   Influenza A 07/14/2023   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   Cerebrovascular disease    Acute renal failure superimposed on stage 3b chronic kidney disease (HCC)  - Baseline Scr 1.7-1.9 01/18/2020   Bilateral leg edema 04/07/2019   Essential hypertension 03/12/2019   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   Anemia 06/02/2017   History of CVA (cerebrovascular accident) 06/02/2017   Hyperlipidemia 06/02/2017   History of esophageal ulcer 08/10/2015   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 07/02/2015   Caregiver with fatigue 09/09/2014   Adjustment disorder with depressed mood 09/09/2014     Past Surgical History   Past Surgical History:  Procedure Laterality Date   ABDOMINAL SURGERY     fluid tumor removal   ABDOMINAL SURGERY      bleeding ulcers   BALLOON DILATION N/A 03/12/2019   Procedure: BALLOON DILATION;  Surgeon: Kerin Salen, MD;  Location: [facility] ENDOSCOPY;  Service: Gastroenterology;  Laterality: N/A;   BIOPSY  03/12/2019   Procedure: BIOPSY;  Surgeon: Kerin Salen, MD;  Location: [facility] ENDOSCOPY;  Service: Gastroenterology;;   CEREBRAL ANEURYSM REPAIR     CSF SHUNT     ESOPHAGOGASTRODUODENOSCOPY (EGD) WITH PROPOFOL N/A 03/12/2019   Procedure: ESOPHAGOGASTRODUODENOSCOPY (EGD) WITH PROPOFOL;  Surgeon: Kerin Salen, MD;  Location: [facility] ENDOSCOPY;  Service: Gastroenterology;  Laterality: N/A;   ROTATOR CUFF REPAIR       Home Medications   Prior to Admission medications   Medication Sig Start Date End Date Taking? Authorizing Provider  acetaminophen (TYLENOL) 325 MG tablet Take 650 mg by mouth every 6 (six) hours as needed for fever (100 and above).    [provider]  DULoxetine (CYMBALTA) 60 MG capsule Take 60 mg by mouth daily.    [provider]  furosemide (LASIX) 20 MG tablet Take 1 tablet (20 mg total) by mouth daily. 04/12/23   Lurene Shadow, MD  loperamide (IMODIUM A-D) 2 MG tablet Take 4 mg by mouth 4 (four) times daily as needed for diarrhea or loose stools.    [provider]  loratadine (CLARITIN) 10 MG tablet Take 10 mg by mouth daily as needed for allergies.    [provider]  LORazepam (ATIVAN) 0.5 MG tablet Take 0.5  mg by mouth every 8 (eight) hours as needed for anxiety. 04/06/23   [provider]  omeprazole (PRILOSEC) 20 MG capsule Take 20 mg by mouth daily. 03/24/23   [provider]  ondansetron (ZOFRAN) 4 MG tablet Take 1 tablet (4 mg total) by mouth every 8 (eight) hours as needed. 06/09/23   Phineas Semen, MD  potassium chloride 20 MEQ/15ML (10%) SOLN Take 15 mLs by mouth 2 (two) times daily. 04/01/23   [provider]  pregabalin (LYRICA) 50 MG capsule Take 50 mg by mouth 3 (three) times daily. 03/22/23   [provider]   QUEtiapine (SEROQUEL) 25 MG tablet Take 12.5 mg by mouth 2 (two) times daily. 03/24/23   [provider]  traMADol (ULTRAM) 50 MG tablet Take 50 mg by mouth every 6 (six) hours as needed for moderate pain (pain score 4-6) or severe pain (pain score 7-10). Use first before trying dilaudid (hydromorphone) 03/22/23   [provider]     Allergies  Carbamazepine, Dilantin [phenytoin], Potassium chloride, Ibuprofen, and Codeine   Family History  History reviewed. No pertinent family history.   Physical Exam  Triage Vital Signs: ED Triage Vitals  Encounter Vitals Group     BP 07/27/23 0232 (!) 158/101     Systolic BP Percentile --      Diastolic BP Percentile --      Pulse Rate 07/27/23 0232 73     Resp 07/27/23 0232 18     Temp 07/27/23 0232 98.2 F (36.8 C)     Temp Source 07/27/23 0232 Oral     SpO2 07/27/23 0232 95 %     Weight 07/27/23 0231 175 lb (79.4 kg)     Height 07/27/23 0231 5' (1.524 m)     Head Circumference --      Peak Flow --      Pain Score 07/27/23 0230 8     Pain Loc --      Pain Education --      Exclude from Growth Chart --     Updated Vital Signs: BP (!) 191/92   Pulse 78   Temp 98.2 F (36.8 C) (Oral)   Resp 11   Ht 5' (1.524 m)   Wt 79.4 kg   SpO2 98%   BMI 34.18 kg/m    General: Awake, no distress.  CV:  RRR.  Good peripheral perfusion.  Resp:  Normal effort.  CTAB. Abd:  Nontender.  No distention.  Other:  Bilateral calves supple but tender to palpation.  No appreciable swelling.  Full range of motion BLE without pain.   ED Results / Procedures / Treatments  Labs (all labs ordered are listed, but only abnormal results are displayed) Labs Reviewed  CBC WITH DIFFERENTIAL/PLATELET - Abnormal; Notable for the following components:      Result Value   Hemoglobin 11.9 (*)    Neutro Abs 8.0 (*)    Abs Immature Granulocytes 0.13 (*)    All other components within normal limits  BASIC METABOLIC PANEL - Abnormal; Notable  for the following components:   Potassium 5.7 (*)    Glucose, Bld 106 (*)    BUN 48 (*)    Creatinine, Ser 1.93 (*)    Calcium 8.8 (*)    GFR, Estimated 27 (*)    All other components within normal limits  BRAIN NATRIURETIC PEPTIDE  CK  TROPONIN I (HIGH SENSITIVITY)     EKG  Pending   RADIOLOGY I have independently  visualized and interpreted patient's imaging study as well as noted the radiology interpretation:  Chest x-ray: Low lung volumes, no acute cardiopulmonary process  Ultrasound: Pending  Official radiology report(s): DG Chest 1 View Result Date: 07/27/2023 CLINICAL DATA:  78 year old female with history of leg swelling. EXAM: CHEST  1 VIEW COMPARISON:  Chest x-ray 07/14/2023. FINDINGS: Lung volumes are very low. Elevation of the left hemidiaphragm (unchanged). No consolidative airspace disease. No pleural effusions. No pneumothorax. No pulmonary nodule or mass noted. Pulmonary vasculature and the cardiomediastinal silhouette are within normal limits. IMPRESSION: 1. Low lung volumes without radiographic evidence of acute cardiopulmonary disease. Electronically Signed   By: Trudie Reed M.D.   On: 07/27/2023 06:25     PROCEDURES:  Critical Care performed: No  Procedures   MEDICATIONS ORDERED IN ED: Medications  sodium chloride 0.9 % bolus 500 mL (has no administration in time range)  HYDROcodone-acetaminophen (NORCO/VICODIN) 5-325 MG per tablet 1 tablet (1 tablet Oral Given 07/27/23 0608)     IMPRESSION / MDM / ASSESSMENT AND PLAN / ED COURSE  I reviewed the triage vital signs and the nursing notes.                             78 year old female leg pain.  Differential diagnosis includes but is not limited to DVT, peripheral edema, infectious, metabolic etiologies, etc.  I personally reviewed patient's records and note a recent hospitalization for influenza from 1/29 - 07/21/2023.  Patient's presentation is most consistent with acute complicated illness /  injury requiring diagnostic workup.  Laboratory results demonstrate worsening renal function, mild hypokalemia without EKG changes.  Administer IV fluid hydration.  Check BMP, CK, troponin.  Obtain DVT ultrasounds.  Give Norco for pain.  Will reassess.  Clinical Course as of 07/27/23 0707  Tue Jul 27, 2023  0705 [place] with bilateral LE pain, no trauma,. EKG pending for mild hyperK of 5.7. US DVT pending. Trop, BNP. If w/u reassuring will be able to dc back to facility.  [SM]  0705 Potassium(!): 5.7 [SM]  0706 Care transferred to Dr. Arnoldo Morale at change of shift pending lab work, EKG and ultrasound. [JS]    Clinical Course User Index [JS] Irean Hong, MD [SM] Corena Herter, MD     FINAL CLINICAL IMPRESSION(S) / ED DIAGNOSES   Final diagnoses:  Bilateral leg pain  AKI (acute kidney injury) (HCC)  Hyperkalemia     Rx / DC Orders   ED Discharge Orders     None        Note:  This document was prepared using Dragon voice recognition software and may include unintentional dictation errors.   Irean Hong, MD 07/27/23 [phone]

## 2023-07-27 NOTE — ED Provider Notes (Addendum)
Care assumed of patient from outgoing provider.  See their note for initial history, exam and plan.  Clinical Course as of 07/27/23 0847  Tue Jul 27, 2023  0705 [place] with bilateral LE pain, no trauma,. EKG pending for mild hyperK of 5.7. US DVT pending. Trop, BNP. If w/u reassuring will be able to dc back to facility.  [SM]  0705 Potassium(!): 5.7 [SM]  0706 Care transferred to Dr. Arnoldo Morale at change of shift pending lab work, EKG and ultrasound. [JS]    Clinical Course User Index [JS] Irean Hong, MD [SM] Corena Herter, MD     .Ultrasound ED Peripheral IV (Provider)  Date/Time: 07/27/2023 8:47 AM  Performed by: Corena Herter, MD Authorized by: Corena Herter, MD   Procedure details:    Indications: multiple failed IV attempts     Skin Prep: chlorhexidine gluconate     Location:  Left AC   Angiocath:  20 G   Bedside Ultrasound Guided: Yes     Images: not archived     Patient tolerated procedure without complications: Yes     Dressing applied: Yes    DVT ultrasound was negative.  CK within normal limits.  Patient has good peripheral pulses and no concern for limb ischemia.  No signs of cellulitis.  No findings concerning for heart failure exacerbation.  Concern for possible neuropathy given her stocking pattern of her symptoms.  Will start on a low-dose gabapentin given her acute kidney injury.  Discussed the need for close follow-up with primary care physician for lab work recheck and for close monitoring of her kidney function.  Given return precautions for any worsening symptoms.  Troponin was mildly elevated here however appears to be chronically elevated.  No chest pain or symptoms of ACS and do not feel that repeat troponin is necessary at this time.  Given a 500 bolus and gabapentin.   Corena Herter, MD 07/27/23 5621    Corena Herter, MD 07/27/23 [phone]

## 2023-09-07 ENCOUNTER — Emergency Department
Admission: EM | Admit: 2023-09-07 | Discharge: 2023-09-07 | Disposition: A | Discharge status: Home / Self Care | Attending: Emergency Medicine | Admitting: Emergency Medicine

## 2023-09-07 ENCOUNTER — Emergency Department

## 2023-09-07 ENCOUNTER — Other Ambulatory Visit: Payer: Self-pay

## 2023-09-07 DIAGNOSIS — I129 Hypertensive chronic kidney disease with stage 1 through stage 4 chronic kidney disease, or unspecified chronic kidney disease: Secondary | ICD-10-CM | POA: Diagnosis not present

## 2023-09-07 DIAGNOSIS — R1013 Epigastric pain: Secondary | ICD-10-CM | POA: Insufficient documentation

## 2023-09-07 DIAGNOSIS — R0602 Shortness of breath: Secondary | ICD-10-CM | POA: Insufficient documentation

## 2023-09-07 DIAGNOSIS — N183 Chronic kidney disease, stage 3 unspecified: Secondary | ICD-10-CM | POA: Diagnosis not present

## 2023-09-07 DIAGNOSIS — R1011 Right upper quadrant pain: Secondary | ICD-10-CM | POA: Diagnosis not present

## 2023-09-07 DIAGNOSIS — R1110 Vomiting, unspecified: Secondary | ICD-10-CM | POA: Diagnosis not present

## 2023-09-07 DIAGNOSIS — R519 Headache, unspecified: Secondary | ICD-10-CM | POA: Diagnosis not present

## 2023-09-07 LAB — CBC WITH DIFFERENTIAL/PLATELET
Abs Immature Granulocytes: 0.02 10*3/uL (ref 0.00–0.07)
Basophils Absolute: 0 10*3/uL (ref 0.0–0.1)
Basophils Relative: 1 %
Eosinophils Absolute: 0.2 10*3/uL (ref 0.0–0.5)
Eosinophils Relative: 4 %
HCT: 31.6 % — ABNORMAL LOW (ref 36.0–46.0)
Hemoglobin: 10.1 g/dL — ABNORMAL LOW (ref 12.0–15.0)
Immature Granulocytes: 0 %
Lymphocytes Relative: 28 %
Lymphs Abs: 1.4 10*3/uL (ref 0.7–4.0)
MCH: 29.4 pg (ref 26.0–34.0)
MCHC: 32 g/dL (ref 30.0–36.0)
MCV: 92.1 fL (ref 80.0–100.0)
Monocytes Absolute: 0.5 10*3/uL (ref 0.1–1.0)
Monocytes Relative: 9 %
Neutro Abs: 3 10*3/uL (ref 1.7–7.7)
Neutrophils Relative %: 58 %
Platelets: 205 10*3/uL (ref 150–400)
RBC: 3.43 MIL/uL — ABNORMAL LOW (ref 3.87–5.11)
RDW: 14.6 % (ref 11.5–15.5)
WBC: 5.2 10*3/uL (ref 4.0–10.5)
nRBC: 0 % (ref 0.0–0.2)

## 2023-09-07 LAB — MAGNESIUM: Magnesium: 2.2 mg/dL (ref 1.7–2.4)

## 2023-09-07 LAB — URINALYSIS, ROUTINE W REFLEX MICROSCOPIC
Bilirubin Urine: NEGATIVE
Glucose, UA: NEGATIVE mg/dL
Hgb urine dipstick: NEGATIVE
Ketones, ur: NEGATIVE mg/dL
Leukocytes,Ua: NEGATIVE
Nitrite: NEGATIVE
Protein, ur: NEGATIVE mg/dL
Specific Gravity, Urine: 1.008 (ref 1.005–1.030)
pH: 5 (ref 5.0–8.0)

## 2023-09-07 LAB — COMPREHENSIVE METABOLIC PANEL
ALT: 11 U/L (ref 0–44)
AST: 15 U/L (ref 15–41)
Albumin: 3.6 g/dL (ref 3.5–5.0)
Alkaline Phosphatase: 132 U/L — ABNORMAL HIGH (ref 38–126)
Anion gap: 10 (ref 5–15)
BUN: 39 mg/dL — ABNORMAL HIGH (ref 8–23)
CO2: 24 mmol/L (ref 22–32)
Calcium: 9 mg/dL (ref 8.9–10.3)
Chloride: 106 mmol/L (ref 98–111)
Creatinine, Ser: 1.9 mg/dL — ABNORMAL HIGH (ref 0.44–1.00)
GFR, Estimated: 27 mL/min — ABNORMAL LOW (ref >60)
Glucose, Bld: 104 mg/dL — ABNORMAL HIGH (ref 70–99)
Potassium: 4.6 mmol/L (ref 3.5–5.1)
Sodium: 140 mmol/L (ref 135–145)
Total Bilirubin: 0.4 mg/dL (ref 0.0–1.2)
Total Protein: 6.7 g/dL (ref 6.5–8.1)

## 2023-09-07 LAB — TROPONIN I (HIGH SENSITIVITY)
Troponin I (High Sensitivity): 13 ng/L (ref <18)
Troponin I (High Sensitivity): 14 ng/L (ref <18)

## 2023-09-07 LAB — LIPASE, BLOOD: Lipase: 19 U/L (ref 11–51)

## 2023-09-07 MED ORDER — LACTATED RINGERS IV BOLUS
1000.0000 mL | Freq: Once | INTRAVENOUS | Status: AC
  Administered 2023-09-07: 1000 mL via INTRAVENOUS

## 2023-09-07 MED ORDER — ACETAMINOPHEN 500 MG PO TABS
1000.0000 mg | ORAL_TABLET | Freq: Once | ORAL | Status: AC
  Administered 2023-09-07: 1000 mg via ORAL
  Filled 2023-09-07: qty 2

## 2023-09-07 MED ORDER — ONDANSETRON HCL 4 MG/2ML IJ SOLN
4.0000 mg | Freq: Once | INTRAMUSCULAR | Status: AC
  Administered 2023-09-07: 4 mg via INTRAVENOUS
  Filled 2023-09-07: qty 2

## 2023-09-07 NOTE — ED Notes (Signed)
 This RN received report from [employer] RN and performed care handoff. This RN introduced self to pt. Call light in reach, bed wheels locked, side rail raised, pt updated on plan of care. Rounding completed.

## 2023-09-07 NOTE — ED Triage Notes (Signed)
 Patient comes in from [facility] [place] via [facility] with complaints of shortness of breath. According to EMS, the patient is allergic to cat, and there was a cat outside. Once the patient returned to her room, she felt like she couldn't catch her breath. Pt has a history of stroke with risidual left side weakness. Pt is alert is alert and oriented x3. Pt with no signs of acute distress at this time.

## 2023-09-07 NOTE — ED Notes (Signed)
 [facility]  called for  transport to  the  [facility]

## 2023-09-07 NOTE — ED Notes (Signed)
[facility] here to transport pt at this time

## 2023-09-07 NOTE — ED Notes (Signed)
 This RN provided report to [place] at [facility] and educated the pt will be transported back to facility via [facility]. Kristen Campbell verbalizes confirmation.

## 2023-09-07 NOTE — ED Provider Notes (Addendum)
 Care assumed of patient from outgoing provider.  See their note for initial history, exam and plan.  Clinical Course as of 09/07/23 1854  Tue Sep 07, 2023  1337 Reassessed.  She is now reporting a headache.  Will add on CT imaging of her head.  [DS]  1502 History of dementia, VP shunt, history of L sided deficits.  EMS states called out for weakness and TIA concern.  Patient states abd pain, dyspnea, and n/v.  Well appearing. Labs reassuring. []  CT and Ultrasound pending.  []  UA [SM]  1853 CT scan of the head without findings of acute pathology.  Ultrasound with no acute findings of acute cholecystitis.  Fatty liver.  Chest x-ray with no acute findings of pneumonia.  On reevaluation patient states she is feeling much better.  Comfortable and watching TV.  States that she is having some difficulty breathing out of her nose earlier today which is why she came into the emergency department but is not having any difficulties at this time.  Repeat abdominal exam was nontender to palpation.  Patient is speaking in full sentences and no signs of respiratory distress.  Plan to discharge back to facility with return precautions. [SM]    Clinical Course User Index [DS] Delton Prairie, MD [SM] Corena Herter, MD     Corena Herter, MD 09/07/23 3244    Corena Herter, MD 09/07/23 [phone]

## 2023-09-07 NOTE — ED Provider Notes (Signed)
 [facility] Provider Note    Event Date/Time   First MD Initiated Contact with Patient 09/07/23 1316     (approximate)   History   Shortness of Breath   HPI  Kristen Campbell is a 78 y.o. female who presents to the ED for evaluation of Shortness of Breath   I review a medical DC summary from 2/5.  History of stroke, VP shunt due to a brain aneurysm, CKD 3, dementia, HTN.  Admitted then for an AKI and influenza A.  Patient presents to the ED with 2 or 3 complaints.  EMS was called out for strokelike symptoms of possible TIA with left-sided weakness.  She tells me that she has had a stroke in the past on her left side has been weak for a long time and the feels normal to her.  She reports primarily an episode of shortness of breath after encountering a cat.  She reports known allergy to her cat.  Reports that after lunch she briefly walked outside but another residents at her facility had a cat out there she began feeling dyspnea without any pain or coughing, so I back to her room and that was her primary complaint and why assistance was called.  When trying to elucidate further history she tells me she also had postprandial emesis and upper abdominal pain after lunch today.   Physical Exam   Triage Vital Signs: ED Triage Vitals  Encounter Vitals Group     BP 09/07/23 1312 115/69     Systolic BP Percentile --      Diastolic BP Percentile --      Pulse Rate 09/07/23 1312 98     Resp --      Temp --      Temp src --      SpO2 09/07/23 1312 100 %     Weight --      Height --      Head Circumference --      Peak Flow --      Pain Score 09/07/23 1318 7     Pain Loc --      Pain Education --      Exclude from Growth Chart --     Most recent vital signs: Vitals:   09/07/23 1312  BP: 115/69  Pulse: 98  Resp: 20  Temp: 98.9 F (37.2 C)  SpO2: 100%    General: Awake, no distress.  CV:  Good peripheral perfusion.  Resp:  Normal effort.   Abd:  No distention.  Epigastric and RUQ tenderness to palpation making her jump a little bit.  Benign lower abdomen. MSK:  No deformity noted.  Neuro:  Mild left-sided weakness is noted with some drift of her leg with leg raise.  She can reports this is chronic and has been this way. Other:     ED Results / Procedures / Treatments   Labs (all labs ordered are listed, but only abnormal results are displayed) Labs Reviewed  COMPREHENSIVE METABOLIC PANEL - Abnormal; Notable for the following components:      Result Value   Glucose, Bld 104 (*)    BUN 39 (*)    Creatinine, Ser 1.90 (*)    Alkaline Phosphatase 132 (*)    GFR, Estimated 27 (*)    All other components within normal limits  CBC WITH DIFFERENTIAL/PLATELET - Abnormal; Notable for the following components:   RBC 3.43 (*)    Hemoglobin 10.1 (*)  HCT 31.6 (*)    All other components within normal limits  MAGNESIUM  LIPASE, BLOOD  URINALYSIS, ROUTINE W REFLEX MICROSCOPIC  TROPONIN I (HIGH SENSITIVITY)  TROPONIN I (HIGH SENSITIVITY)    EKG Sinus rhythm with a rate of 96 bpm.  Normal axis and intervals.  No clear signs of acute ischemia.  RADIOLOGY   Official radiology report(s): US ABDOMEN LIMITED RUQ (LIVER/GB) Result Date: 09/07/2023 CLINICAL DATA:  Right upper quadrant abdominal pain. EXAM: ULTRASOUND ABDOMEN LIMITED RIGHT UPPER QUADRANT COMPARISON:  CT abdomen pelvis dated 06/09/2023. FINDINGS: Evaluation is limited due to body habitus and inability of patient to cooperate with exam. Gallbladder: No gallstones or wall thickening visualized. No sonographic Murphy sign noted by sonographer. Common bile duct: Diameter: 4 mm Liver: There is diffuse increased liver echogenicity most commonly seen in the setting of fatty infiltration. Superimposed inflammation or fibrosis is not excluded. Clinical correlation is recommended. Portal vein is patent on color Doppler imaging with normal direction of blood flow towards the  liver. Other: None. IMPRESSION: Fatty liver, otherwise unremarkable right upper quadrant ultrasound. Electronically Signed   By: Elgie Collard M.D.   On: 09/07/2023 15:55    PROCEDURES and INTERVENTIONS:  .1-3 Lead EKG Interpretation  Performed by: Delton Prairie, MD Authorized by: Delton Prairie, MD     Interpretation: normal     ECG rate:  90   ECG rate assessment: normal     Rhythm: sinus rhythm     Ectopy: none     Conduction: normal     Medications  lactated ringers bolus 1,000 mL (1,000 mLs Intravenous New Bag/Given 09/07/23 1337)  ondansetron (ZOFRAN) injection 4 mg (4 mg Intravenous Given 09/07/23 1335)  acetaminophen (TYLENOL) tablet 1,000 mg (1,000 mg Oral Given 09/07/23 1340)     IMPRESSION / MDM / ASSESSMENT AND PLAN / ED COURSE  I reviewed the triage vital signs and the nursing notes.  Differential diagnosis includes, but is not limited to, biliary colic, pancreatitis, allergic reaction, hives, anaphylaxis, pneumonia  {Patient presents with symptoms of an acute illness or injury that is potentially life-threatening.  Pleasantly demented patient presents after an episode of shortness of breath and postprandial emesis today.  She remains asymptomatic here and reports feeling better.  At 1 point on my exam she had epigastric and RUQ tenderness but a multiple reassessments she is nontender throughout.  Has some mild left leg weakness that is reportedly chronic from a remote stroke.  Overall benign exam.  Blood work with normal renal function around baseline, reassuring CBC without leukocytosis, normal lipase, troponin.  Pending imaging around the time of signout to oncoming physician.  If she remains asymptomatic with a continued reassuring workup she may be suitable for outpatient management  Clinical Course as of 09/07/23 1557  Tue Sep 07, 2023  1337 Reassessed.  She is now reporting a headache.  Will add on CT imaging of her head.  [DS]  1502 History of dementia, VP  shunt, history of L sided deficits.  EMS states called out for weakness and TIA concern.  Patient states abd pain, dyspnea, and n/v.  Well appearing. Labs reassuring. []  CT and Ultrasound pending.  []  UA [SM]    Clinical Course User Index [DS] Delton Prairie, MD [SM] Corena Herter, MD     FINAL CLINICAL IMPRESSION(S) / ED DIAGNOSES   Final diagnoses:  Shortness of breath  Postprandial vomiting     Rx / DC Orders   ED Discharge Orders  None        Note:  This document was prepared using Dragon voice recognition software and may include unintentional dictation errors.   Delton Prairie, MD 09/07/23 [phone]

## 2023-10-04 ENCOUNTER — Other Ambulatory Visit: Payer: Self-pay

## 2023-10-04 ENCOUNTER — Encounter: Payer: Self-pay | Admitting: Emergency Medicine

## 2023-10-04 ENCOUNTER — Emergency Department
Admission: EM | Admit: 2023-10-04 | Discharge: 2023-10-04 | Disposition: A | Discharge status: Skilled Nursing Facility | Attending: Emergency Medicine | Admitting: Emergency Medicine

## 2023-10-04 DIAGNOSIS — N390 Urinary tract infection, site not specified: Secondary | ICD-10-CM | POA: Diagnosis not present

## 2023-10-04 DIAGNOSIS — R350 Frequency of micturition: Secondary | ICD-10-CM | POA: Diagnosis present

## 2023-10-04 DIAGNOSIS — F0390 Unspecified dementia without behavioral disturbance: Secondary | ICD-10-CM | POA: Diagnosis not present

## 2023-10-04 DIAGNOSIS — I129 Hypertensive chronic kidney disease with stage 1 through stage 4 chronic kidney disease, or unspecified chronic kidney disease: Secondary | ICD-10-CM | POA: Diagnosis not present

## 2023-10-04 DIAGNOSIS — N189 Chronic kidney disease, unspecified: Secondary | ICD-10-CM | POA: Insufficient documentation

## 2023-10-04 LAB — URINALYSIS, ROUTINE W REFLEX MICROSCOPIC
Bilirubin Urine: NEGATIVE
Glucose, UA: NEGATIVE mg/dL
Hgb urine dipstick: NEGATIVE
Ketones, ur: NEGATIVE mg/dL
Nitrite: NEGATIVE
Protein, ur: NEGATIVE mg/dL
Specific Gravity, Urine: 1.006 (ref 1.005–1.030)
pH: 7 (ref 5.0–8.0)

## 2023-10-04 LAB — CBC
HCT: 34.3 % — ABNORMAL LOW (ref 36.0–46.0)
Hemoglobin: 10.7 g/dL — ABNORMAL LOW (ref 12.0–15.0)
MCH: 28.6 pg (ref 26.0–34.0)
MCHC: 31.2 g/dL (ref 30.0–36.0)
MCV: 91.7 fL (ref 80.0–100.0)
Platelets: 208 10*3/uL (ref 150–400)
RBC: 3.74 MIL/uL — ABNORMAL LOW (ref 3.87–5.11)
RDW: 14.3 % (ref 11.5–15.5)
WBC: 5.6 10*3/uL (ref 4.0–10.5)
nRBC: 0 % (ref 0.0–0.2)

## 2023-10-04 LAB — BASIC METABOLIC PANEL WITH GFR
Anion gap: 10 (ref 5–15)
BUN: 44 mg/dL — ABNORMAL HIGH (ref 8–23)
CO2: 29 mmol/L (ref 22–32)
Calcium: 9.4 mg/dL (ref 8.9–10.3)
Chloride: 101 mmol/L (ref 98–111)
Creatinine, Ser: 1.99 mg/dL — ABNORMAL HIGH (ref 0.44–1.00)
GFR, Estimated: 26 mL/min — ABNORMAL LOW (ref >60)
Glucose, Bld: 109 mg/dL — ABNORMAL HIGH (ref 70–99)
Potassium: 4 mmol/L (ref 3.5–5.1)
Sodium: 140 mmol/L (ref 135–145)

## 2023-10-04 MED ORDER — CEPHALEXIN 250 MG PO CAPS
250.0000 mg | ORAL_CAPSULE | Freq: Once | ORAL | Status: AC
Start: 2023-10-04 — End: 2023-10-04
  Administered 2023-10-04: 250 mg via ORAL
  Filled 2023-10-04: qty 1

## 2023-10-04 MED ORDER — CEPHALEXIN 500 MG PO CAPS
500.0000 mg | ORAL_CAPSULE | Freq: Once | ORAL | Status: DC
  Filled 2023-10-04: qty 1

## 2023-10-04 MED ORDER — CEPHALEXIN 250 MG PO CAPS: 250.0000 mg | ORAL_CAPSULE | Freq: Three times a day (TID) | ORAL | 0 refills | Status: AC

## 2023-10-04 MED ORDER — CEPHALEXIN 250 MG PO CAPS: 250.0000 mg | ORAL_CAPSULE | Freq: Three times a day (TID) | ORAL | 0 refills | Status: DC

## 2023-10-04 NOTE — ED Triage Notes (Signed)
 Pt in via [facility] from [facility] [place]; states her kidney function needs to be checked.  Only complaint of patients is urinary frequency, reports this has been ongoing for months.  A/Ox3, NAD noted.

## 2023-10-04 NOTE — ED Notes (Signed)
 Called [facility] spoke with [name] to see when they can transport /she stated 9:30 or 10:00

## 2023-10-04 NOTE — ED Provider Notes (Signed)
 [facility] Provider Note    Event Date/Time   First MD Initiated Contact with Patient 10/04/23 1648     (approximate)   History   Chief Complaint Abnormal Lab and Urinary Frequency   HPI  Kristen Campbell is a 78 y.o. female with past medical history of hypertension, stroke, CKD, PUD, and dementia who presents to the ED complaining of urinary frequency.  Patient reports that she has been urinating frequently over the past couple of days and staff at her facility were concerned about her renal function.  She reports having blood drawn earlier today and was told that she needed to go to the hospital due to her kidney function.  She states that she is urinating more frequently than usual, but denies any dysuria, abdominal pain, flank pain, or fever.  She denies any nausea, vomiting, or diarrhea.  She states she has been eating and drinking normally.     Physical Exam   Triage Vital Signs: ED Triage Vitals  Encounter Vitals Group     BP 10/04/23 1306 134/79     Systolic BP Percentile --      Diastolic BP Percentile --      Pulse Rate 10/04/23 1306 93     Resp 10/04/23 1306 18     Temp 10/04/23 1306 98.5 F (36.9 C)     Temp Source 10/04/23 1306 Oral     SpO2 10/04/23 1306 97 %     Weight 10/04/23 1309 160 lb (72.6 kg)     Height 10/04/23 1309 5' (1.524 m)     Head Circumference --      Peak Flow --      Pain Score 10/04/23 1309 0     Pain Loc --      Pain Education --      Exclude from Growth Chart --     Most recent vital signs: Vitals:   10/04/23 1306 10/04/23 1744  BP: 134/79 (!) 137/53  Pulse: 93 82  Resp: 18 18  Temp: 98.5 F (36.9 C) 98.5 F (36.9 C)  SpO2: 97% 100%    Constitutional: Alert and oriented. Eyes: Conjunctivae are normal. Head: Atraumatic. Nose: No congestion/rhinnorhea. Mouth/Throat: Mucous membranes are moist.  Cardiovascular: Normal rate, regular rhythm. Grossly normal heart sounds.  2+ radial pulses  bilaterally. Respiratory: Normal respiratory effort.  No retractions. Lungs CTAB. Gastrointestinal: Soft and nontender.  No CVA tenderness bilaterally.  No distention. Musculoskeletal: No lower extremity tenderness nor edema.  Neurologic:  Normal speech and language. No gross focal neurologic deficits are appreciated.    ED Results / Procedures / Treatments   Labs (all labs ordered are listed, but only abnormal results are displayed) Labs Reviewed  URINALYSIS, ROUTINE W REFLEX MICROSCOPIC - Abnormal; Notable for the following components:      Result Value   Color, Urine STRAW (*)    APPearance CLEAR (*)    Leukocytes,Ua SMALL (*)    Bacteria, UA RARE (*)    All other components within normal limits  BASIC METABOLIC PANEL WITH GFR - Abnormal; Notable for the following components:   Glucose, Bld 109 (*)    BUN 44 (*)    Creatinine, Ser 1.99 (*)    GFR, Estimated 26 (*)    All other components within normal limits  CBC - Abnormal; Notable for the following components:   RBC 3.74 (*)    Hemoglobin 10.7 (*)    HCT 34.3 (*)    All other components  within normal limits  URINE CULTURE    PROCEDURES:  Critical Care performed: No  Procedures   MEDICATIONS ORDERED IN ED: Medications  cephALEXin  (KEFLEX ) capsule 250 mg (has no administration in time range)     IMPRESSION / MDM / ASSESSMENT AND PLAN / ED COURSE  I reviewed the triage vital signs and the nursing notes.                              78 y.o. female with past medical history of hypertension, stroke, CKD, PUD, and dementia who presents to the ED due to concern for urinary frequency and abnormal renal function.  Patient's presentation is most consistent with acute presentation with potential threat to life or bodily function.  Differential diagnosis includes, but is not limited to, AKI, renal failure, electrolyte abnormality, UTI, pyelonephritis.  Patient nontoxic-appearing and in no acute distress, vital signs  are unremarkable.  She appears to be at her baseline mental status with a benign abdominal exam and no CVA tenderness.  Vital signs are reassuring and do not appear concerning for sepsis.  While her renal function is abnormal, it is stable compared to previous with no significant AKI or electrolyte abnormality.  No significant anemia or leukocytosis noted, urinalysis pending at this time.  Urinalysis appears concerning for UTI, will send for culture.  Previous urine culture grew pansensitive E. coli and we will treat with Keflex .  Patient appropriate for discharge back to her nursing facility with prescription for antibiotics.  Daughter updated via phone and is in agreement with plan.      FINAL CLINICAL IMPRESSION(S) / ED DIAGNOSES   Final diagnoses:  Lower urinary tract infectious disease  Chronic kidney disease, unspecified CKD stage     Rx / DC Orders   ED Discharge Orders          Ordered    cephALEXin  (KEFLEX ) 250 MG capsule  3 times daily,   Status:  Discontinued        10/04/23 1833    cephALEXin  (KEFLEX ) 250 MG capsule  3 times daily        10/04/23 1833             Note:  This document was prepared using Dragon voice recognition software and may include unintentional dictation errors.   Twilla Galea, MD 10/04/23 [phone]

## 2023-10-04 NOTE — ED Notes (Addendum)
 [facility] here to pick up pt Called [facility] and spoke with shamia

## 2023-10-04 NOTE — ED Triage Notes (Signed)
 First nurse note: Patient arrived by [facility] from [facility] [place] [place]. Lab work came back today with abnormal kidney function.   Patient denies pain   EMS vitals: 90HR 96% RA 146/64 b/p

## 2023-10-05 LAB — URINE CULTURE

## 2023-12-23 ENCOUNTER — Ambulatory Visit: Admitting: Podiatry

## 2024-06-23 ENCOUNTER — Emergency Department

## 2024-06-23 ENCOUNTER — Emergency Department: Admission: EM | Admit: 2024-06-23 | Discharge: 2024-06-23 | Disposition: A | Discharge status: Home / Self Care

## 2024-06-23 ENCOUNTER — Other Ambulatory Visit: Payer: Self-pay

## 2024-06-23 DIAGNOSIS — Z8673 Personal history of transient ischemic attack (TIA), and cerebral infarction without residual deficits: Secondary | ICD-10-CM | POA: Diagnosis not present

## 2024-06-23 DIAGNOSIS — S0101XA Laceration without foreign body of scalp, initial encounter: Secondary | ICD-10-CM | POA: Diagnosis not present

## 2024-06-23 DIAGNOSIS — W01198A Fall on same level from slipping, tripping and stumbling with subsequent striking against other object, initial encounter: Secondary | ICD-10-CM | POA: Insufficient documentation

## 2024-06-23 DIAGNOSIS — I129 Hypertensive chronic kidney disease with stage 1 through stage 4 chronic kidney disease, or unspecified chronic kidney disease: Secondary | ICD-10-CM | POA: Insufficient documentation

## 2024-06-23 DIAGNOSIS — S0990XA Unspecified injury of head, initial encounter: Secondary | ICD-10-CM | POA: Diagnosis present

## 2024-06-23 DIAGNOSIS — Z23 Encounter for immunization: Secondary | ICD-10-CM | POA: Insufficient documentation

## 2024-06-23 DIAGNOSIS — N189 Chronic kidney disease, unspecified: Secondary | ICD-10-CM | POA: Insufficient documentation

## 2024-06-23 DIAGNOSIS — W19XXXA Unspecified fall, initial encounter: Secondary | ICD-10-CM

## 2024-06-23 MED ORDER — TETANUS-DIPHTH-ACELL PERTUSSIS 5-2-15.5 LF-MCG/0.5 IM SUSP
0.5000 mL | Freq: Once | INTRAMUSCULAR | Status: AC
  Administered 2024-06-23: 0.5 mL via INTRAMUSCULAR
  Filled 2024-06-23: qty 0.5

## 2024-06-23 MED ORDER — OXYCODONE-ACETAMINOPHEN 5-325 MG PO TABS
1.0000 | ORAL_TABLET | Freq: Once | ORAL | Status: AC
  Administered 2024-06-23: 1 via ORAL
  Filled 2024-06-23: qty 1

## 2024-06-23 MED ORDER — LIDOCAINE HCL (PF) 1 % IJ SOLN
5.0000 mL | Freq: Once | INTRAMUSCULAR | Status: AC
  Administered 2024-06-23: 5 mL
  Filled 2024-06-23: qty 5

## 2024-06-23 NOTE — ED Notes (Signed)
 [facility] called Spoke with [place] regarding transfer to [facility]

## 2024-06-23 NOTE — ED Triage Notes (Signed)
 Pt arrives via EMS from the [place] [facility] [place] for a fall. Pt sts that she stood up and simply fell backwards and hit her head on the bed. There is approx 1 inch lac on pt post head. Bleeding controlled at this time.

## 2024-06-23 NOTE — ED Provider Notes (Signed)
 "  [facility] Provider Note    Event Date/Time   First MD Initiated Contact with Patient 06/23/24 [phone]     (approximate)   History   Fall   HPI  Kristen Campbell is a 79 y.o. female with a past medical history of hypertension, CKD, CVA, presenting to the emergency department via EMS from [place] [facility] [place] after a fall.  Patient reports that she stood up and fell backwards striking the back of her head.  She states that she does not think she lost consciousness for a few minutes.  She does not take any blood thinners.     Physical Exam   Triage Vital Signs: ED Triage Vitals  Encounter Vitals Group     BP      Girls Systolic BP Percentile      Girls Diastolic BP Percentile      Boys Systolic BP Percentile      Boys Diastolic BP Percentile      Pulse      Resp      Temp      Temp src      SpO2      Weight      Height      Head Circumference      Peak Flow      Pain Score      Pain Loc      Pain Education      Exclude from Growth Chart     Most recent vital signs: There were no vitals filed for this visit.   General: Awake, no distress.  CV:  Good peripheral perfusion.  Resp:  Normal effort.  Abd:  No distention.  Other:  2 cm laceration to the left posterior scalp, bleeding controlled   ED Results / Procedures / Treatments   Labs (all labs ordered are listed, but only abnormal results are displayed) Labs Reviewed - No data to display   EKG ED ECG REPORT I, Reche CHRISTELLA Leventhal, the attending physician, personally viewed and interpreted this ECG.  Date: 06/23/2024 Rate: 83 bpm Rhythm: sinus rhythm QRS Axis: normal Intervals: normal ST/T Wave abnormalities: normal Narrative Interpretation: no evidence of acute ischemia     RADIOLOGY Head ct: IMPRESSION: 1. No acute intracranial hemorrhage or calvarial fracture. 2. Right posterior approach ventricular catheter in place with overall similar ventricular size and  configuration. 3. Left posterior scalp swelling/laceration. 4. Increased opacification of the right mastoid and middle ear cavities with soft tissue versus cerumen in the right external auditory canal. Recommend direct inspection and correlation. 5. Increased right sphenoid sinus mucosal thickening with hyperattenuating secretions, correlate for sinusitis.  Cervical CT: IMPRESSION: 1. No acute fracture or traumatic malalignment of the cervical spine. 2. Post C5-C7 ACDF without evidence of complication. 3. Partially evaluated right mastoid, middle ear, and external auditory canal opacification; recommend otoscopic evaluation and consider dedicated temporal bone CT as clinically indicated.   PROCEDURES:  Critical Care performed: No  .Laceration Repair  Date/Time: 06/23/2024 8:33 AM  Performed by: Leventhal Reche CHRISTELLA, MD Authorized by: Leventhal Reche CHRISTELLA, MD   Consent:    Consent obtained:  Verbal   Consent given by:  Patient   Risks, benefits, and alternatives were discussed: yes     Risks discussed:  Infection and pain Universal protocol:    Procedure explained and questions answered to patient or proxy's satisfaction: yes     Imaging studies available: yes   Anesthesia:    Anesthesia method:  Local  infiltration   Local anesthetic:  Lidocaine  1% w/o epi Laceration details:    Location:  Scalp   Scalp location:  L parietal   Length (cm):  3 Pre-procedure details:    Preparation:  Patient was prepped and draped in usual sterile fashion and imaging obtained to evaluate for foreign bodies Exploration:    Hemostasis achieved with:  Direct pressure   Imaging outcome: foreign body not noted     Contaminated: no   Treatment:    Area cleansed with:  Povidone-iodine and saline   Amount of cleaning:  Standard   Irrigation solution:  Sterile saline   Irrigation method:  Syringe Skin repair:    Repair method:  Sutures   Suture size:  3-0   Suture technique:  Simple  interrupted Approximation:    Approximation:  Close Repair type:    Repair type:  Simple Post-procedure details:    Dressing:  Sterile dressing   Procedure completion:  Tolerated well, no immediate complications    MEDICATIONS ORDERED IN ED: Medications  lidocaine  (PF) (XYLOCAINE ) 1 % injection 5 mL (has no administration in time range)  oxyCODONE -acetaminophen  (PERCOCET/ROXICET) 5-325 MG per tablet 1 tablet (has no administration in time range)     IMPRESSION / MDM / ASSESSMENT AND PLAN / ED COURSE  I reviewed the triage vital signs and the nursing notes.                              Differential diagnosis includes, but is not limited to, intracranial hemorrhage, concussion, scalp laceration, vertebral fracture  Patient's presentation is most consistent with acute complicated illness / injury requiring diagnostic workup.  Patient is a 79 year old female with past medical history of hypertension, CKD, CVA, hyperlipidemia, presenting to the emergency department from [place] [facility] [place] via EMS following a fall.  The patient has a 2 cm laceration to her posterior scalp with controlled bleeding at this time.  CT imaging was performed and does not show any acute traumatic findings.  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  On exam the patient does have cerumen present.  We discussed over-the-counter cerumen removal kits.  Laceration was repaired at bedside.  Please see above procedure note for further details.  Patient tolerated procedure well.  No complications.  Discussed with the patient plan for discharge with instructions to follow-up in 1 week for suture removal.  She will keep the area clean and dry.  Watch carefully for signs of infection.  Return to the emergency  department sooner for any new or worsening symptoms.      FINAL CLINICAL IMPRESSION(S) / ED DIAGNOSES   Final diagnoses:  Injury of head, initial encounter  Laceration of scalp, initial encounter  Fall, initial encounter     Rx / DC Orders   ED Discharge Orders     None        Note:  This document was prepared using Dragon voice recognition software and may include unintentional dictation errors.   [place] Reche HERO, MD 06/23/24 [phone]  "

## 2024-06-23 NOTE — Discharge Instructions (Signed)
 Please return to this facility or to another medical facility in 1 week for suture removal.  Please keep the area clean and dry.  Return to the ER sooner if you have any signs of infection such as worsening pain, drainage, redness, or swelling.
